# Patient Record
Sex: Male | Born: 1940 | Race: White | Hispanic: No
Health system: Southern US, Community
[De-identification: ages and names within clinical notes are randomized; demographics above are authoritative.]

## PROBLEM LIST (undated history)

## (undated) DIAGNOSIS — F0390 Unspecified dementia without behavioral disturbance: Secondary | ICD-10-CM

## (undated) DIAGNOSIS — G20 Parkinson's disease: Secondary | ICD-10-CM

## (undated) DIAGNOSIS — H9190 Unspecified hearing loss, unspecified ear: Secondary | ICD-10-CM

## (undated) DIAGNOSIS — W19XXXA Unspecified fall, initial encounter: Secondary | ICD-10-CM

## (undated) DIAGNOSIS — R296 Repeated falls: Secondary | ICD-10-CM

## (undated) DIAGNOSIS — G20A1 Parkinson's disease without dyskinesia, without mention of fluctuations: Secondary | ICD-10-CM

## (undated) HISTORY — PX: OTHER SURGICAL HISTORY: SHX169

## (undated) HISTORY — DX: Unspecified fall, initial encounter: W19.XXXA

## (undated) HISTORY — DX: Unspecified dementia, unspecified severity, without behavioral disturbance, psychotic disturbance, mood disturbance, and anxiety: F03.90

## (undated) HISTORY — DX: Repeated falls: R29.6

## (undated) HISTORY — DX: Parkinson's disease: G20

## (undated) HISTORY — DX: Unspecified hearing loss, unspecified ear: H91.90

## (undated) HISTORY — DX: Parkinson's disease without dyskinesia, without mention of fluctuations: G20.A1

---

## 2013-11-16 ENCOUNTER — Ambulatory Visit: Payer: Self-pay

## 2013-11-16 ENCOUNTER — Ambulatory Visit: Payer: PRIVATE HEALTH INSURANCE

## 2013-11-16 ENCOUNTER — Ambulatory Visit (INDEPENDENT_AMBULATORY_CARE_PROVIDER_SITE_OTHER): Payer: PRIVATE HEALTH INSURANCE | Admitting: Family Medicine

## 2013-11-16 DIAGNOSIS — R0789 Other chest pain: Secondary | ICD-10-CM

## 2013-11-16 DIAGNOSIS — M25531 Pain in right wrist: Secondary | ICD-10-CM

## 2013-11-16 DIAGNOSIS — R071 Chest pain on breathing: Secondary | ICD-10-CM

## 2013-11-16 DIAGNOSIS — S62109A Fracture of unspecified carpal bone, unspecified wrist, initial encounter for closed fracture: Secondary | ICD-10-CM | POA: Diagnosis not present

## 2013-11-16 DIAGNOSIS — M25539 Pain in unspecified wrist: Secondary | ICD-10-CM

## 2013-11-16 DIAGNOSIS — S62101A Fracture of unspecified carpal bone, right wrist, initial encounter for closed fracture: Secondary | ICD-10-CM

## 2013-11-16 DIAGNOSIS — IMO0002 Reserved for concepts with insufficient information to code with codable children: Secondary | ICD-10-CM

## 2013-11-16 DIAGNOSIS — T148XXA Other injury of unspecified body region, initial encounter: Secondary | ICD-10-CM

## 2013-11-16 MED ORDER — SILVER SULFADIAZINE 1 % EX CREA: 1.0000 "application " | TOPICAL_CREAM | Freq: Every day | CUTANEOUS | Status: DC

## 2013-11-16 MED ORDER — HYDROCODONE-ACETAMINOPHEN 5-325 MG PO TABS: 1.0000 | ORAL_TABLET | Freq: Four times a day (QID) | ORAL | Status: DC | PRN

## 2013-11-16 NOTE — Progress Notes (Signed)
Subjective:    Patient ID: Kyle Mcguire, male    DOB: 03-Jun-1941, 72 y.o.   MRN: [id number]  This chart was scribed for Elvina Sidle, MD by Blanchard Kelch, ED Scribe. The patient was seen in room 9. Patient's care was started at 4:34 PM.   HPI  Kyle Mcguire is a 72 y.o. male who presents to office due to a fall that occurred four and a half hours ago. He was about to walk a dog and lost his balance on some steps and landed on a cement slab. He denies hitting his head or losing consciousness. He has abrasions on the dorsum of his right pinky, ring and middle finger, right knee and a long diagonal abrasion on his chest that goes from his right pectoral down to his upper abdomen.   He came to the office because he started to feel short of breath after the fall. Immediately after the fall he felt as though he couldn't breath, but that subsided quickly. He then felt better and went home to his condo to lie down, but upon getting up he felt his chest tighten. The pain from his chest laceration is exacerbated by deep breaths, making it more difficult to breathe. He also has pain and mild swelling to his right wrist. He is right handed.   He states that is tetanus vaccination is up to date.  He and his wife are from [place] and come down to [place] in the winter to escape the cold weather and snow. Their son lives in [place]. This is his first week down in [place] this season. He enjoys playing golf down here.  He worked in trucking.  No past medical history on file. No past surgical history on file. No family history on file. No current outpatient prescriptions on file prior to visit.   No current facility-administered medications on file prior to visit.   Allergies not on file   Review of Systems  Constitutional: Negative for fever.  HENT: Negative for drooling.   Eyes: Negative for discharge.  Respiratory: Negative for cough.   Cardiovascular: Negative for  leg swelling.  Gastrointestinal: Negative for vomiting.  Endocrine: Negative for polyuria.  Genitourinary: Negative for hematuria.  Musculoskeletal: Positive for arthralgias and joint swelling. Negative for gait problem.  Skin: Positive for wound. Negative for rash.  Allergic/Immunologic: Negative for immunocompromised state.  Neurological: Negative for speech difficulty.  Hematological: Negative for adenopathy.  Psychiatric/Behavioral: Negative for confusion.       Objective:   Physical Exam  Nursing note and vitals reviewed.  CONSTITUTIONAL: Well developed/well nourished HEAD: Normocephalic/atraumatic EYES: EOMI/PERRL ENMT: Mucous membranes moist NECK: supple no meningeal signs SPINE:entire spine nontender CV: S1/S2 noted, no murmurs/rubs/gallops noted LUNGS: Lungs are clear to auscultation bilaterally, no apparent distress ABDOMEN: soft, nontender, no rebound or guarding GU:no cva tenderness NEURO: Pt is awake/alert, moves all extremitiesx4 EXTREMITIES: pulses normal, full ROM, tenderness and mild edema to right wrist SKIN: warm, color normal, abrasions to right pinky, ring, middle finger, PIP dorsal, chest has long diagonal 3.4 cm wide abrasion from right pectoral to left upper abdomen. PSYCH: no abnormalities of mood noted  [facility] reading (PRIMARY) by Dr. Milus Glazier: Right wrist x-ray: distal radial fracture through joint line, small effusion, no bony spurring. Chest x-ray: no infiltrates, no fractures, no pneumothorax     Assessment & Plan:   I personally performed the services described in this documentation, which was scribed in my presence. The recorded  information has been reviewed and is accurate. Chest wall pain - Plan: DG Chest 2 View, HYDROcodone-acetaminophen (NORCO) 5-325 MG per tablet  Right wrist pain - Plan: DG Wrist Complete Right, HYDROcodone-acetaminophen (NORCO) 5-325 MG per tablet  Wrist fracture, right, closed, initial encounter - Plan:  HYDROcodone-acetaminophen (NORCO) 5-325 MG per tablet, Ambulatory referral to Orthopedic Surgery  Abrasion - Plan: silver sulfADIAZINE (SILVADENE) 1 % cream  Signed, Elvina Sidle, MD

## 2013-11-18 ENCOUNTER — Telehealth: Payer: Self-pay

## 2013-11-18 NOTE — Telephone Encounter (Addendum)
Pt's wife states that the hydrocodone is no longer working for him, he would like to know what else he can take with that. He would also like to know if he can take ibuprophen or tylenol for in addition to the hydrocodone.  Pharm[facility]: [facility]  Best# [phone]

## 2013-11-22 NOTE — Telephone Encounter (Signed)
Left a message for patient to return call.

## 2013-11-28 NOTE — Telephone Encounter (Signed)
He can try ibuprofen with this. Called to advise, left detailed message.

## 2013-11-28 NOTE — Telephone Encounter (Signed)
LMOM for patient to return call.

## 2014-12-19 ENCOUNTER — Ambulatory Visit
Admission: RE | Admit: 2014-12-19 | Discharge: 2014-12-19 | Disposition: A | Discharge status: Home / Self Care | Payer: PPO | Source: Ambulatory Visit | Attending: Family Medicine | Admitting: Family Medicine

## 2014-12-19 ENCOUNTER — Other Ambulatory Visit: Payer: Self-pay | Admitting: Family Medicine

## 2014-12-19 DIAGNOSIS — N5089 Other specified disorders of the male genital organs: Secondary | ICD-10-CM

## 2014-12-27 ENCOUNTER — Encounter: Payer: Self-pay | Admitting: Neurology

## 2014-12-27 ENCOUNTER — Ambulatory Visit (INDEPENDENT_AMBULATORY_CARE_PROVIDER_SITE_OTHER): Payer: PPO | Admitting: Neurology

## 2014-12-27 VITALS — BP 120/80 | HR 80 | Ht 67.0 in | Wt 198.6 lb

## 2014-12-27 DIAGNOSIS — G20 Parkinson's disease: Secondary | ICD-10-CM | POA: Diagnosis not present

## 2014-12-27 MED ORDER — RASAGILINE MESYLATE 0.5 MG PO TABS: 0.5000 mg | ORAL_TABLET | Freq: Every day | ORAL | Status: DC

## 2014-12-27 NOTE — Patient Instructions (Addendum)
Overall you are doing fairly well but I do want to suggest a few things today:   Remember to drink plenty of fluid, eat healthy meals and do not skip any meals. Try to eat protein with a every meal and eat a healthy snack such as fruit or nuts in between meals. Try to keep a regular sleep-wake schedule and try to exercise daily, particularly in the form of walking, 20-30 minutes a day, if you can.   As far as your medications are concerned, I would like to suggest: Azilect 0.5mg  daily. Please avoid the following medications while using Azilect: dextromethorphan (can be found in cough medicines), Luvox (fluvoxamine), Prozac (fluoxetine), Flexeril (cyclobenzaprine), Darvon (propoxyphene), St. John's Wort, Tramadol, Methadone, and Demerol (meperidine).  If you need to take ciprofloxacin for an infection, cut down the Azilect to  tab prior to starting the cipro  As far as diagnostic testing: MRI of the brain  I would like to see you back in 3-4, sooner if we need to. Please call us with any interim questions, concerns, problems, updates or refill requests.   Please also call us for any test results so we can go over those with you on the phone.  My clinical assistant and will answer any of your questions and relay your messages to me and also relay most of my messages to you.   Our phone number is [phone]. We also have an after hours call service for urgent matters and there is a physician on-call for urgent questions. For any emergencies you know to call 911 or go to the nearest emergency room

## 2014-12-27 NOTE — Progress Notes (Signed)
[facility]    Provider:  Dr Kyle Mcguire Referring Provider: Vernie Shanks, MD Primary Care Physician:  No PCP Per Patient  CC:  Tremors  HPI:  Kyle Mcguire is a 74 y.o. male with no significant PMHx who is here as a consult from Dr. Jacelyn Mcguire for Tremors. Tremors started 8-9 months ago. Started in the right hand. He noticed it when he tried to write, penmanship was smaller and shaky. Staying about the same. Left arm is fine. Walking fine. Sense of smell ok. Has constipation. He just moved here from [place]. Lives in a town house. Plays golf, lives independently. Can't read his writing anymore. No stiffness. Father had a tremor. Balance is getting poor, if he bends down he may fall over. Vision is changing, "hazy" vision when driving at night. Worked in trucking for 33 years. He can't pick up as much as he used to. He is very forgetful. He will forget recent conversations, his wife will tell him something and a few hours later he may forget. Dad with Parkinson's Disease. Tremor is mostly at rest or when writing. Was walking the dog and the dog went quickly and he couldn't stop himself from falling down, and broke his wrist. Sometimes when he speeds up he can't stop, has to "stutter step" to turn. No neck pain. He takes daily aspirin, no history of stroke or TIA. Denies DM or heart disease. He takes no medication. He does not drink alcohol and never smoked.   Reviewed notes, labs and imaging from outside physicians, which showed: Was sent from [facility] for evaluation of tremor. CMP wnl, LDL 98,   Review of Systems: Patient complains of symptoms per HPI as well as the following symptoms: Tremor.snoring.  Denies SOB, CP. Pertinent negatives per HPI. All others negative.   History   Social History  . Marital Status: Married    Spouse Name: Kyle Mcguire     Number of Children: 2  . Years of Education: 12   Occupational History  . Retired     Social History Main Topics  . Smoking status:  Never Smoker   . Smokeless tobacco: Never Used  . Alcohol Use: No  . Drug Use: No  . Sexual Activity: Not on file   Other Topics Concern  . Not on file   Social History Narrative   Patient lives at home with wife Kyle Mcguire   Patient has 2 children    Patient has a high school education    Patient is right handed    Patient is retired        Family History  Problem Relation Age of Onset  . Parkinsonism Father     Past Medical History  Diagnosis Date  . Hearing loss     Past Surgical History  Procedure Laterality Date  . None      Current Outpatient Prescriptions  Medication Sig Dispense Refill  . aspirin 81 MG chewable tablet Chew 81 mg by mouth daily.    . rasagiline (AZILECT) 0.5 MG TABS tablet Take 1 tablet (0.5 mg total) by mouth daily. 30 tablet 0  . rasagiline (AZILECT) 0.5 MG TABS tablet Take 1 tablet (0.5 mg total) by mouth daily. 30 tablet 6   No current facility-administered medications for this visit.    Allergies as of 12/27/2014  . (No Known Allergies)    Vitals: BP 120/80 mmHg  Pulse 80  Ht 5\' 7"  (1.702 m)  Wt 198 lb 9.6 oz (90.084 kg)  BMI 31.10 kg/m2 Last Weight:  Wt Readings from Last 1 Encounters:  12/27/14 198 lb 9.6 oz (90.084 kg)   Last Height:   Ht Readings from Last 1 Encounters:  12/27/14 5\' 7"  (1.702 m)   Physical exam: Exam: Gen: NAD, conversant, well groomed                     CV: RRR, no MRG. No Carotid Bruits. No peripheral edema, warm, nontender Eyes: Conjunctivae clear without exudates or hemorrhage  Neuro: Detailed Neurologic Exam  Speech:    Speech is normal; fluent and spontaneous with normal comprehension.  Cognition:    The patient is oriented to person, place, and time;     recent and remote memory intact;     language fluent;     normal attention, concentration,     fund of knowledge Cranial Nerves:     The pupils are equal, round, and reactive to light. The fundi are normal and spontaneous venous  pulsations are present. Visual fields are full to finger confrontation. Extraocular movements are intact. Trigeminal sensation is intact and the muscles of mastication are normal. The face is symmetric. The palate elevates in the midline. Reduced hearing bilat. Voice is normal. Shoulder shrug is normal. The tongue has normal motion without fasciculations.   Coordination:    Normal rapid alternating movements. 2-3 steps back with push test  Gait:    Reduced arm swing. Mildly shuffling.    Motor Observation:    Mild intention tremor Tone:   cogwheeling uppers with facilitation   Posture:    Mildly stooped    Strength:    Strength is V/V in the upper and lower limbs.      Sensation: intact to LT     Reflex Exam:  DTR's:    Deep tendon reflexes in the upper and lower extremities are Brisk throughout esp patellars.  Toes:    The toes are downgoing bilaterally.   Clonus:    Clonus is absent.  Assessment/Plan:  74 year old male here for evaluation of tremor, worsening penmanship, balance problems, falls. Exam shows parkinsonian symptoms, stooped with mild shuffling and reduced arm swing, cogwheeling with facilitation. Will order MRi of the brain.   Will start Azilect and follow up in 4-6 weeks at which time can increase the Azilect. Discussed the following: please avoid the following medications while using Azilect: dextromethorphan (can be found in cough medicines), Luvox (fluvoxamine), Prozac (fluoxetine), Flexeril (cyclobenzaprine), Darvon (propoxyphene), St. John's Wort, Tramadol, Methadone, and Demerol (meperidine).  If you need to take ciprofloxacin for an infection, cut down the Azilect to  tab prior to starting the cipro  Continue daily asa 81mg  for stroke prevention.   Kyle Ill, MD  [facility] [address] [place], [place] [address]  Phone [phone] Fax [phone]  A total of 30 minutes was spent in with this patient. Over  half this time was spent on counseling patient on the diagnosis of Parkinsonism and different diagnostic and  therapeutic options available.

## 2014-12-29 MED ORDER — RASAGILINE MESYLATE 0.5 MG PO TABS: 0.5000 mg | ORAL_TABLET | Freq: Every day | ORAL | Status: DC

## 2014-12-31 ENCOUNTER — Encounter: Payer: Self-pay | Admitting: Neurology

## 2014-12-31 DIAGNOSIS — G20 Parkinson's disease: Secondary | ICD-10-CM | POA: Insufficient documentation

## 2015-01-04 ENCOUNTER — Ambulatory Visit
Admission: RE | Admit: 2015-01-04 | Discharge: 2015-01-04 | Disposition: A | Discharge status: Home / Self Care | Payer: PPO | Source: Ambulatory Visit | Attending: Neurology | Admitting: Neurology

## 2015-01-04 DIAGNOSIS — G20 Parkinson's disease: Secondary | ICD-10-CM | POA: Diagnosis not present

## 2015-01-08 ENCOUNTER — Telehealth: Payer: Self-pay | Admitting: Neurology

## 2015-01-08 NOTE — Telephone Encounter (Signed)
I called the patient to provide number for patient assistance of [phone].  He says he will contact them to see if he qualifies for assistance and will call us back if needed.

## 2015-01-08 NOTE — Telephone Encounter (Signed)
Spoke to patient and let him know the MRi of his brain was unremarkable, non-specific white matter changes and some mild age-appropriate atrophy. The Azilect is really helping however insurance will not cover it.  Jessica - Anything we can do to help get this patient Azilect? Please let me know, thank you.

## 2015-02-15 ENCOUNTER — Ambulatory Visit (INDEPENDENT_AMBULATORY_CARE_PROVIDER_SITE_OTHER): Payer: PPO | Admitting: Neurology

## 2015-02-15 ENCOUNTER — Encounter: Payer: Self-pay | Admitting: Neurology

## 2015-02-15 VITALS — BP 126/74 | HR 67 | Ht 67.0 in | Wt 199.0 lb

## 2015-02-15 DIAGNOSIS — G20 Parkinson's disease: Secondary | ICD-10-CM | POA: Diagnosis not present

## 2015-02-15 DIAGNOSIS — G20A1 Parkinson's disease without dyskinesia, without mention of fluctuations: Secondary | ICD-10-CM

## 2015-02-15 MED ORDER — CARBIDOPA-LEVODOPA 25-100 MG PO TABS: 1.0000 | ORAL_TABLET | Freq: Three times a day (TID) | ORAL | Status: DC

## 2015-02-15 NOTE — Patient Instructions (Addendum)
Overall you are doing fairly well but I do want to suggest a few things today:   Remember to drink plenty of fluid, eat healthy meals and do not skip any meals. Try to eat protein with a every meal and eat a healthy snack such as fruit or nuts in between meals. Try to keep a regular sleep-wake schedule and try to exercise daily, particularly in the form of walking, 20-30 minutes a day, if you can.   As far as your medications are concerned, I would like to suggest: Start with Sinemet (carbidopa/levodopa) 1/2 tablet three times a day. After 5-7 days can increase to 1 tablet 3 times daily. ry to separate Sinemet from food (especially protein-rich foods like meat, dairy, eggs) by about 30-60 mins - this will help the absorption of the medication. If you have some nausea with the medication, you can take it with some light food like crackers or ginger ale. Take it 4 hours apart for example 8am, noon and 4pm.   I would like to see you back in 4 - 6 weeks or call, sooner if we need to. Please call us with any interim questions, concerns, problems, updates or refill requests.   Please also call us for any test results so we can go over those with you on the phone.  My clinical assistant and will answer any of your questions and relay your messages to me and also relay most of my messages to you.   Our phone number is [phone]. We also have an after hours call service for urgent matters and there is a physician on-call for urgent questions. For any emergencies you know to call 911 or go to the nearest emergency room  There is an excellent Parkinson's support group in [place]. It is called "[employer]" and meets once a month. Please call Amy Marriot or Vianne Bulls at [phone] for more information. There is no charge/fee for joining the group.

## 2015-02-15 NOTE — Progress Notes (Signed)
[facility]    Provider:  Dr Jaynee Eagles Referring Provider: No ref. provider found Primary Care Physician:  No PCP Per Patient  CC:  PD  HPI:  Kyle Mcguire is a 74 y.o. male here as a follow up.   Azilect not helping with the tremor and making him dizzy. Otherwise symptoms are the same.   Reviewed notes, labs and imaging from outside physicians, which showed:  MR Brsain 12/2014: This is a borderline normal MRI of the brain without contrast showing a mild extent of white matter foci that are likely due to age-related microvascular change. There is associated minimal atrophy. There are no acute findings.  Initial visit 12/27/2014: Kyle Mcguire is a 74 y.o. male with no significant PMHx who is here as a consult from Dr. Jacelyn Grip for Tremors. Tremors started 8-9 months ago. Started in the right hand. He noticed it when he tried to write, penmanship was smaller and shaky. Staying about the same. Left arm is fine. Walking fine. Sense of smell ok. Has constipation. He just moved here from [place]. Lives in a town house. Plays golf, lives independently. Can't read his writing anymore. No stiffness. Father had a tremor. Balance is getting poor, if he bends down he may fall over. Vision is changing, "hazy" vision when driving at night. Worked in trucking for 33 years. He can't pick up as much as he used to. He is very forgetful. He will forget recent conversations, his wife will tell him something and a few hours later he may forget. Dad with Parkinson's Disease. Tremor is mostly at rest or when writing. Was walking the dog and the dog went quickly and he couldn't stop himself from falling down, and broke his wrist. Sometimes when he speeds up he can't stop, has to "stutter step" to turn. No neck pain. He takes daily aspirin, no history of stroke or TIA. Denies DM or heart disease. He takes no medication. He does not drink alcohol and never smoked.   Reviewed notes, labs and imaging from  outside physicians, which showed: Was sent from [facility] for evaluation of tremor. CMP wnl, LDL 98,   MRI of the brain: This is a borderline normal MRI of the brain without contrast showing a mild extent of white matter foci that are likely due to age-related microvascular change. There is associated minimal atrophy. There are no acute findings.   Review of Systems: Patient complains of symptoms per HPI as well as the following symptoms:blurred vision. Pertinent negatives per HPI. All others negative.   History   Social History  . Marital Status: Married    Spouse Name: [name]   . Number of Children: 2  . Years of Education: 12   Occupational History  . Retired     Social History Main Topics  . Smoking status: Never Smoker   . Smokeless tobacco: Never Used  . Alcohol Use: No  . Drug Use: No  . Sexual Activity: Not on file   Other Topics Concern  . Not on file   Social History Narrative   Patient lives at home with wife [name]   Patient has 2 children    Patient has a high school education    Patient is right handed    Patient is retired    Caffeine use: 1 cup coffee.day     Family History  Problem Relation Age of Onset  . Parkinsonism Father     Past Medical History  Diagnosis Date  .  Hearing loss     Past Surgical History  Procedure Laterality Date  . None      Current Outpatient Prescriptions  Medication Sig Dispense Refill  . aspirin 81 MG chewable tablet Chew 81 mg by mouth daily.    . rasagiline (AZILECT) 0.5 MG TABS tablet Take 1 tablet (0.5 mg total) by mouth daily. 30 tablet 0  . rasagiline (AZILECT) 0.5 MG TABS tablet Take 1 tablet (0.5 mg total) by mouth daily. 30 tablet 6   No current facility-administered medications for this visit.    Allergies as of 02/15/2015  . (No Known Allergies)    Vitals: BP 126/74 mmHg  Pulse 67  Ht 5\' 7"  (1.702 m)  Wt 199 lb (90.266 kg)  BMI 31.16 kg/m2 Last Weight:  Wt Readings from Last 1 Encounters:    02/15/15 199 lb (90.266 kg)   Last Height:   Ht Readings from Last 1 Encounters:  02/15/15 5\' 7"  (1.702 m)   Cranial Nerves: decreased blink  The pupils are equal, round, and reactive to light. The fundi are normal and spontaneous venous pulsations are present. Visual fields are full to finger confrontation. Extraocular movements are intact. Trigeminal sensation is intact and the muscles of mastication are normal. The face is symmetric. The palate elevates in the midline. Reduced hearing bilat. Voice is normal. Shoulder shrug is normal. The tongue has normal motion without fasciculations.   Coordination:  Normal rapid alternating movements. 2-3 steps back with push test  Gait:  Reduced arm swing. Mildly shuffling.   Motor Observation:  Mild resting tremor with  Intention and postural components Tone:  cogwheeling uppers with facilitation   Posture:  Mildly stooped   Strength:  Strength is V/V in the upper and lower limbs.    Sensation: intact to LT   Reflex Exam:  DTR's:  Deep tendon reflexes in the upper and lower extremities are Brisk throughout esp patellars.  Toes:  The toes are downgoing bilaterally.  Clonus:  Clonus is absent.      Assessment/Plan:  74 year old male here for evaluation of tremor, worsening penmanship, balance problems, falls. Exam shows parkinsonian symptoms, stooped with mild shuffling and reduced arm swing, cogwheeling with facilitation. MRI of the brain unremarkable.   Did not tolerate Azilect, will switch to Sinemet. Discussed side effects and when to stop.  [employer] [facility] PD group   Sarina Ill, MD  [facility] [address] [place], [place] [address]  Phone [phone] Fax [phone]  A total of 25 minutes was spent face-to-face with this patient. Over half this time was spent on counseling patient on the PD diagnosis and different diagnostic and therapeutic options  available.

## 2015-04-16 ENCOUNTER — Telehealth: Payer: Self-pay | Admitting: *Deleted

## 2015-04-16 NOTE — Telephone Encounter (Signed)
Trying to reschedule pt appointment on 03-28-15 at 10:30 am to a different day for follow-up 68min appt since Dr. Jaynee Eagles is no longer here on Fridays. Gave [facility] phone number and office hours. Please reschedule if pt calls back. Thank you!

## 2015-04-19 NOTE — Telephone Encounter (Signed)
Spoke with patient and rescheduled him from 04/27/15 to 04/26/15 due to Dr. Jaynee Eagles no longer here on Friday's. Pt aware and verbalized understanding.

## 2015-04-26 ENCOUNTER — Encounter: Payer: Self-pay | Admitting: Neurology

## 2015-04-26 ENCOUNTER — Ambulatory Visit (INDEPENDENT_AMBULATORY_CARE_PROVIDER_SITE_OTHER): Payer: PPO | Admitting: Neurology

## 2015-04-26 VITALS — BP 122/68 | HR 64 | Temp 97.9°F | Wt 195.6 lb

## 2015-04-26 DIAGNOSIS — G20 Parkinson's disease: Secondary | ICD-10-CM | POA: Diagnosis not present

## 2015-04-26 MED ORDER — CARBIDOPA-LEVODOPA 25-100 MG PO TABS: 2.0000 | ORAL_TABLET | Freq: Three times a day (TID) | ORAL | Status: DC

## 2015-04-26 NOTE — Progress Notes (Signed)
[facility]    Provider:  Dr Jaynee Eagles Referring Provider: No ref. provider found Primary Care Physician:  Anthoney Harada, MD  CC: Parkinson's Disease (PD)  Interval update 04/26/2015: He stopped the Sinemet 2 weeks ago. Says it didn't help. Explained that we may need to increase it. Discussed in depth the plan to slowly increase the Sinemet, that this is medication for PD but we need to increase slowly. If it didn't help, dose was too low and we had plans to increase.  No side effects.    Visit 3/3: JOSELITO FIELDHOUSE is a 74 y.o. male here as a follow up.   Azilect not helping with the tremor and making him dizzy. Otherwise symptoms are the same.   Reviewed notes, labs and imaging from outside physicians, which showed:  MR Brsain 12/2014: This is a borderline normal MRI of the brain without contrast showing a mild extent of white matter foci that are likely due to age-related microvascular change. There is associated minimal atrophy. There are no acute findings.  Initial visit 12/27/2014: JUVON TEATER is a 74 y.o. male with no significant PMHx who is here as a consult from Dr. Jacelyn Grip for Tremors. Tremors started 8-9 months ago. Started in the right hand. He noticed it when he tried to write, penmanship was smaller and shaky. Staying about the same. Left arm is fine. Walking fine. Sense of smell ok. Has constipation. He just moved here from [place]. Lives in a town house. Plays golf, lives independently. Can't read his writing anymore. No stiffness. Father had a tremor. Balance is getting poor, if he bends down he may fall over. Vision is changing, "hazy" vision when driving at night. Worked in trucking for 33 years. He can't pick up as much as he used to. He is very forgetful. He will forget recent conversations, his wife will tell him something and a few hours later he may forget. Dad with Parkinson's Disease. Tremor is mostly at rest or when writing. Was walking the dog and  the dog went quickly and he couldn't stop himself from falling down, and broke his wrist. Sometimes when he speeds up he can't stop, has to "stutter step" to turn. No neck pain. He takes daily aspirin, no history of stroke or TIA. Denies DM or heart disease. He takes no medication. He does not drink alcohol and never smoked.   Reviewed notes, labs and imaging from outside physicians, which showed: Was sent from [facility] for evaluation of tremor. CMP wnl, LDL 98,   MRI of the brain: This is a borderline normal MRI of the brain without contrast showing a mild extent of white matter foci that are likely due to age-related microvascular change. There is associated minimal atrophy. There are no acute findings.  Review of Systems: Patient complains of symptoms per HPI as well as the following symptoms: Tremor, no chest pain, no dizziness, no shortness of breath. Pertinent negatives per HPI. All others negative.   History   Social History  . Marital Status: Married    Spouse Name: [name]   . Number of Children: 2  . Years of Education: 12   Occupational History  . Retired     Social History Main Topics  . Smoking status: Never Smoker   . Smokeless tobacco: Never Used  . Alcohol Use: No  . Drug Use: No  . Sexual Activity: Not on file   Other Topics Concern  . Not on file   Social History Narrative  Patient lives at home with wife [name]   Patient has 2 children    Patient has a high school education    Patient is right handed    Patient is retired    Caffeine use: 1 cup coffee.day     Family History  Problem Relation Age of Onset  . Parkinsonism Father     Past Medical History  Diagnosis Date  . Hearing loss     Past Surgical History  Procedure Laterality Date  . None      Current Outpatient Prescriptions  Medication Sig Dispense Refill  . aspirin 81 MG chewable tablet Chew 81 mg by mouth daily.    . carbidopa-levodopa (SINEMET) 25-100 MG per tablet Take 2 tablets  by mouth 3 (three) times daily. 180 tablet 6   No current facility-administered medications for this visit.    Allergies as of 04/26/2015  . (No Known Allergies)    Vitals: BP 122/68 mmHg  Pulse 64  Temp(Src) 97.9 F (36.6 C)  Wt 195 lb 9.6 oz (88.724 kg) Last Weight:  Wt Readings from Last 1 Encounters:  04/26/15 195 lb 9.6 oz (88.724 kg)   Last Height:   Ht Readings from Last 1 Encounters:  02/15/15 5\' 7"  (1.702 m)   Cranial Nerves: decreased blink  The pupils are equal, round, and reactive to light. The fundi are normal and spontaneous venous pulsations are present. Visual fields are full to finger confrontation. Extraocular movements are intact. Trigeminal sensation is intact and the muscles of mastication are normal. The face is symmetric. The palate elevates in the midline. Reduced hearing bilat. Voice is normal. Shoulder shrug is normal. The tongue has normal motion without fasciculations.   Coordination:  Normal rapid alternating movements. 2-3 steps back with push test  Gait:  Reduced arm swing. Mildly shuffling.   Motor Observation:  Mild resting tremor with Intention and postural components Tone:  cogwheeling uppers with facilitation   Posture:  Mildly stooped   Strength:  Strength is V/V in the upper and lower limbs.         Assessment/Plan: 74 year old male here for evaluation of tremor, worsening penmanship, balance problems, falls. Exam shows parkinsonian symptoms, stooped with mild shuffling and reduced arm swing, cogwheeling with facilitation. MRI of the brain unremarkable.   Did not tolerate Azilect, will continue with Sinemet. Discussed side effects and when to stop. As far as your medications are concerned, I would like to suggest:  Sinemet: for one week take 1.5 pills three times a day then can increase to 2 pills three times a day. Space 4 hours apart. Try to separate Sinemet from food (especially protein-rich foods  like meat, dairy, eggs) by about 30-60 mins - this will help the absorption of the medication. If you have some nausea with the medication, you can take it with some light food like crackers or ginger ale  [facility] over PD group  Sarina Ill, MD  [facility] [address] [place], [place] [address]  Phone [phone] Fax [phone]  A total of 15 minutes was spent face-to-face with this patient. Over half this time was spent on counseling patient on the PD diagnosis and different diagnostic and therapeutic options available.

## 2015-04-26 NOTE — Patient Instructions (Addendum)
Remember to drink plenty of fluid, eat healthy meals and do not skip any meals. Try to eat protein with a every meal and eat a healthy snack such as fruit or nuts in between meals. Try to keep a regular sleep-wake schedule and try to exercise daily, particularly in the form of walking, 20-30 minutes a day, if you can.   As far as your medications are concerned, I would like to suggest:  Sinemet: for one week take 1.5 pills three times a day then can increase to 2 pills three times a day. Space 4 hours apart.  Try to separate Sinemet from food (especially protein-rich foods like meat, dairy, eggs) by about 30-60 mins - this will help the absorption of the medication. If you have some nausea with the medication, you can take it with some light food like crackers or ginger ale   As far as diagnostic testing:   I would like to see you back in 3 months, sooner if we need to. Please call us with any interim questions, concerns, problems, updates or refill requests.   Please call us if the medication is not helping or you have side effects.   My clinical assistant and will answer any of your questions and relay your messages to me and also relay most of my messages to you.   Our phone number is [phone]. We also have an after hours call service for urgent matters and there is a physician on-call for urgent questions. For any emergencies you know to call 911 or go to the nearest emergency room

## 2015-04-27 ENCOUNTER — Ambulatory Visit: Payer: PPO | Admitting: Neurology

## 2015-05-08 ENCOUNTER — Telehealth: Payer: Self-pay | Admitting: Neurology

## 2015-05-08 NOTE — Telephone Encounter (Signed)
Tol dhim to go back down to 1.5 pills three times a day if that dose was working and not causing him side effects. thanks

## 2015-05-08 NOTE — Telephone Encounter (Signed)
Talked with patient and he stated after he increased in Sinemet to 2 pills three times a day he started having bad episodes in the last three to four days where he was very shaky and ached all over. Pt stated he was also dizzy "but it said on medication bottle this may cause dizziness so I was not worried". Pt went to see his PCP, Dr. Jacelyn Grip around 12pm today and pt stated Dr. Jacelyn Grip advised him the dose might be too high and advised him to decrease to 1 pill three times a day and to contact Dr. Jaynee Eagles to check about dosing. I told him I would let Dr. Jaynee Eagles know and we will call him back. Pt verbalized understanding.

## 2015-05-08 NOTE — Telephone Encounter (Signed)
Pt called and requested to speak with someone regarding some questions he has about his medication Rx. carbidopa-levodopa (SINEMET) 25-100 MG per tablet. He does not think the medication is working correctly and states that he had an "episode" this morning and would like to speak with someone regarding this also. Please call and advise.

## 2015-07-31 ENCOUNTER — Ambulatory Visit: Payer: Self-pay | Admitting: Neurology

## 2015-08-13 ENCOUNTER — Ambulatory Visit (INDEPENDENT_AMBULATORY_CARE_PROVIDER_SITE_OTHER): Payer: PPO | Admitting: Neurology

## 2015-08-13 ENCOUNTER — Encounter: Payer: Self-pay | Admitting: Neurology

## 2015-08-13 ENCOUNTER — Telehealth: Payer: Self-pay | Admitting: Neurology

## 2015-08-13 VITALS — BP 108/69 | HR 63 | Ht 67.0 in | Wt 192.0 lb

## 2015-08-13 DIAGNOSIS — G20 Parkinson's disease: Secondary | ICD-10-CM | POA: Diagnosis not present

## 2015-08-13 MED ORDER — CARBIDOPA-LEVODOPA 25-100 MG PO TABS: 2.0000 | ORAL_TABLET | Freq: Four times a day (QID) | ORAL | Status: DC

## 2015-08-13 NOTE — Telephone Encounter (Signed)
Anik/[facility] called, has questions re: carbidopa-levodopa (SINEMET) 25-100 MG per tablet directions are confusing 2- tabs 4x per day or 2- tabs 6x per day? Please call to advise.

## 2015-08-13 NOTE — Patient Instructions (Addendum)
Overall you are doing fairly well but I do want to suggest a few things today:   Remember to drink plenty of fluid, eat healthy meals and do not skip any meals. Try to eat protein with a every meal and eat a healthy snack such as fruit or nuts in between meals. Try to keep a regular sleep-wake schedule and try to exercise daily, particularly in the form of walking, 20-30 minutes a day, if you can.   As far as your medications are concerned, I would like to suggest. Sinemet 1.5 tablets 4x a day every 4 hours. -Try to separate Sinemet from food (especially protein-rich foods like meat, dairy, eggs) by about 30-60 mins - this will help the absorption of the medication. If you have some nausea with the medication, you can take it with some light food like crackers or ginger ale  Example: 7:30am 11:30am 3:30pm 7:30pm  I would like to see you back in 6 months, sooner if we need to. Please call us with any interim questions, concerns, problems, updates or refill requests.   Our phone number is [phone]. We also have an after hours call service for urgent matters and there is a physician on-call for urgent questions. For any emergencies you know to call 911 or go to the nearest emergency room

## 2015-08-13 NOTE — Progress Notes (Signed)
[facility]    Provider:  Dr Jaynee Eagles Referring Provider: Vernie Shanks, MD Primary Care Physician:  Anthoney Harada, MD  CC:  PD  HPI:  Kyle Mcguire is a wonderful 74 y.o. male here as a follow up for PD  Interval history: He took the sinemet this morning at 7:30am. The tremor is better right now. The sinemet is helping it, it just wears off.  Not hard for him to take it 3x a day. Once in a while he forgets. He doesn't know how long the sinemet lasts. No dizziness when he stands up. Not lightheaded, no symptoms fron the sinemet. Right now at this moment he is happy with his tremor, last took the Sinemet a few hours ago. He gets up at 7:30 amd walks to sheets. He goes to bed between 10 and11pm. Feels like his voice is getting lower. No problems with sleeping or vivid dreams. No falls. Sense of smell and taste is ok. No dysphagia. No memory changes. No hallucinations. No falls. No drooling. Noticed slight tremor in the right hand by the end of the appointment.   Vould try taking the Sinemet every 4 hours. 8am, noon, 4pm and 8pm. In the morning his handwriting is worse but he takes the sinemet and that is fine, he doesn't need a long-acting at night. But right   Interval update 04/26/2015: He stopped the Sinemet 2 weeks ago. Says it didn't help. Explained that we may need to increase it. Discussed in depth the plan to slowly increase the Sinemet, that this is medication for PD but we need to increase slowly. If it didn't help, dose was too low and we had plans to increase. No side effects.    Visit 3/3: Kyle Mcguire is a 74 y.o. male here as a follow up.   Azilect not helping with the tremor and making him dizzy. Otherwise symptoms are the same.   Reviewed notes, labs and imaging from outside physicians, which showed:  MR Brsain 12/2014: This is a borderline normal MRI of the brain without contrast showing a mild extent of white matter foci that are likely due to  age-related microvascular change. There is associated minimal atrophy. There are no acute findings.  Initial visit 12/27/2014: Kyle Mcguire is a 74 y.o. male with no significant PMHx who is here as a consult from Dr. Jacelyn Grip for Tremors. Tremors started 8-9 months ago. Started in the right hand. He noticed it when he tried to write, penmanship was smaller and shaky. Staying about the same. Left arm is fine. Walking fine. Sense of smell ok. Has constipation. He just moved here from [place]. Lives in a town house. Plays golf, lives independently. Can't read his writing anymore. No stiffness. Father had a tremor. Balance is getting poor, if he bends down he may fall over. Vision is changing, "hazy" vision when driving at night. Worked in trucking for 33 years. He can't pick up as much as he used to. He is very forgetful. He will forget recent conversations, his wife will tell him something and a few hours later he may forget. Dad with Parkinson's Disease. Tremor is mostly at rest or when writing. Was walking the dog and the dog went quickly and he couldn't stop himself from falling down, and broke his wrist. Sometimes when he speeds up he can't stop, has to "stutter step" to turn. No neck pain. He takes daily aspirin, no history of stroke or TIA. Denies DM or heart  disease. He takes no medication. He does not drink alcohol and never smoked.   Reviewed notes, labs and imaging from outside physicians, which showed: Was sent from [facility] for evaluation of tremor. CMP wnl, LDL 98,   MRI of the brain: This is a borderline normal MRI of the brain without contrast showing a mild extent of white matter foci that are likely due to age-related microvascular change. There is associated minimal atrophy. There are no acute findings.  Review of Systems: Patient complains of symptoms per HPI as well as the following symptoms: Tremor, no chest pain, no dizziness, no shortness of breath. Pertinent negatives per HPI. All others  negative.  Social History   Social History  . Marital Status: Married    Spouse Name: [name]   . Number of Children: 2  . Years of Education: 12   Occupational History  . Retired     Social History Main Topics  . Smoking status: Never Smoker   . Smokeless tobacco: Never Used  . Alcohol Use: No  . Drug Use: No  . Sexual Activity: Not on file   Other Topics Concern  . Not on file   Social History Narrative   Patient lives at home with wife [name]   Patient has 2 children    Patient has a high school education    Patient is right handed    Patient is retired    Caffeine use: 1 cup coffee.day     Family History  Problem Relation Age of Onset  . Parkinsonism Father     Past Medical History  Diagnosis Date  . Hearing loss     Past Surgical History  Procedure Laterality Date  . None    . No surgical history per patient      Current Outpatient Prescriptions  Medication Sig Dispense Refill  . aspirin 81 MG chewable tablet Chew 81 mg by mouth daily.    . carbidopa-levodopa (SINEMET) 25-100 MG per tablet Take 2 tablets by mouth 3 (three) times daily. 180 tablet 6   No current facility-administered medications for this visit.    Allergies as of 08/13/2015  . (No Known Allergies)    Vitals: BP 108/69 mmHg  Pulse 63  Wt 192 lb (87.091 kg) Last Weight:  Wt Readings from Last 1 Encounters:  08/13/15 192 lb (87.091 kg)   Last Height:   Ht Readings from Last 1 Encounters:  02/15/15 5\' 7"  (1.702 m)     Cranial Nerves: decreased blink  The pupils are equal, round, and reactive to light.  Visual fields are full to finger confrontation. Extraocular movements are intact. Trigeminal sensation is intact and the muscles of mastication are normal. The face is symmetric. The palate elevates in the midline. Reduced hearing bilat. Voice is normal. Shoulder shrug is normal. The tongue has normal motion without fasciculations.   Coordination:  Normal rapid  alternating movements. 2-3 steps back with push test  Gait:  Reduced arm swing. Minimally shuffling.   Motor Observation:  Mild resting tremor with Intention and postural components that is improved today since he is on the Sinemet Tone:  cogwheeling uppers with facilitation improved  Posture:  Mildly stooped   Strength:  Strength is V/V in the upper and lower limbs.     Assessment/Plan: 74 year old wonderful 74 year old male here for evaluation of tremor, worsening penmanship, balance problems, falls. Exam shows parkinsonian symptoms, stooped with mild shuffling and reduced arm swing, cogwheeling with facilitation. MRI of the brain  unremarkable.   Did not tolerate Azilect, will continue with  1.5 pills three four time a day (8am, noon, 4pm and 8pm for example). Space 4 hours apart. Try to separate Sinemet from food (especially protein-rich foods like meat, dairy, eggs) by about 30-60 mins - this will help the absorption of the medication. If you have some nausea with the medication, you can take it with some light food like crackers or ginger ale  Power over PD group, information given    Sarina Ill, MD  [facility] [address] [place], [place] [address]  Phone [phone] Fax [phone]  A total of 30 minutes was spent face-to-face with this patient. Over half this time was spent on counseling patient on the Parkinson's Disease diagnosis and different diagnostic and therapeutic options available.

## 2015-08-14 NOTE — Telephone Encounter (Signed)
Janett Billow - would you mind taking care of calling the pharmacy and changing this prescription for me please?[name] He is taking 1.5 tabs 4x a day ( at 7:30am, 11:30am, 3:30pm, 7:30pm. ) I called him to verify. The prescription can just say 1.5 Sinemet tabs 4x a day and that will be fine. Dispense 180 tabs will 11 refills. Patient understands he needs to take it every 4 hours so that doesn't need to be spelled out in the script. I spoke to patient to verify that he is taking 1.5 tabs (not 2 tabs). He feels it doing very well. Thanks!

## 2015-08-15 MED ORDER — CARBIDOPA-LEVODOPA 25-100 MG PO TABS: 1.5000 | ORAL_TABLET | Freq: Four times a day (QID) | ORAL | Status: DC

## 2015-08-15 NOTE — Telephone Encounter (Signed)
No problem!  Rx has been updated and sent per instruction.  Receipt confirmed by pharmacy.

## 2015-11-23 ENCOUNTER — Other Ambulatory Visit: Payer: Self-pay | Admitting: Neurology

## 2016-01-03 DIAGNOSIS — E669 Obesity, unspecified: Secondary | ICD-10-CM | POA: Diagnosis not present

## 2016-01-03 DIAGNOSIS — G20 Parkinson's disease: Secondary | ICD-10-CM | POA: Diagnosis not present

## 2016-01-03 DIAGNOSIS — Z13220 Encounter for screening for lipoid disorders: Secondary | ICD-10-CM | POA: Diagnosis not present

## 2016-01-03 DIAGNOSIS — Z136 Encounter for screening for cardiovascular disorders: Secondary | ICD-10-CM | POA: Diagnosis not present

## 2016-01-03 DIAGNOSIS — M62830 Muscle spasm of back: Secondary | ICD-10-CM | POA: Diagnosis not present

## 2016-01-03 DIAGNOSIS — Z0000 Encounter for general adult medical examination without abnormal findings: Secondary | ICD-10-CM | POA: Diagnosis not present

## 2016-01-03 DIAGNOSIS — L570 Actinic keratosis: Secondary | ICD-10-CM | POA: Diagnosis not present

## 2016-02-12 ENCOUNTER — Ambulatory Visit (INDEPENDENT_AMBULATORY_CARE_PROVIDER_SITE_OTHER): Payer: PPO | Admitting: Neurology

## 2016-02-12 ENCOUNTER — Encounter: Payer: Self-pay | Admitting: Neurology

## 2016-02-12 ENCOUNTER — Telehealth: Payer: Self-pay | Admitting: Neurology

## 2016-02-12 VITALS — BP 113/72 | HR 60 | Ht 67.0 in | Wt 195.6 lb

## 2016-02-12 DIAGNOSIS — R42 Dizziness and giddiness: Secondary | ICD-10-CM

## 2016-02-12 DIAGNOSIS — I951 Orthostatic hypotension: Secondary | ICD-10-CM | POA: Diagnosis not present

## 2016-02-12 MED ORDER — CARBIDOPA-LEVODOPA 25-100 MG PO TABS: 1.0000 | ORAL_TABLET | Freq: Four times a day (QID) | ORAL | Status: DC

## 2016-02-12 NOTE — Telephone Encounter (Signed)
Called and LVM for pt to schedule his 6 month f/u since pt didn't stop to checkout.

## 2016-02-12 NOTE — Patient Instructions (Signed)
Overall you are doing fairly well but I do want to suggest a few things today:   Remember to drink plenty of fluid, eat healthy meals and do not skip any meals. Try to eat protein with a every meal and eat a healthy snack such as fruit or nuts in between meals. Try to keep a regular sleep-wake schedule and try to exercise daily, particularly in the form of walking, 20-30 minutes a day, if you can.   As far as your medications are concerned, I would like to suggest: continue current medications  As far as diagnostic testing: Labs  I would like to see you back in 6 months, sooner if we need to. Please call us with any interim questions, concerns, problems, updates or refill requests.   Our phone number is [phone]. We also have an after hours call service for urgent matters and there is a physician on-call for urgent questions. For any emergencies you know to call 911 or go to the nearest emergency room

## 2016-02-12 NOTE — Progress Notes (Signed)
[id number]    Provider:  Dr Kyle Mcguire Referring Provider: Vernie Shanks, MD Primary Care Physician:  Kyle Harada, MD  CC: PD  HPI: Kyle Mcguire is a wonderful 75 y.o. male here as a follow up for PD  He is on 1 tab 4 times a day every 4 hours. His tremor is better. He takes the medication around 8am he feels the tremor about 10 - 10:30. Discussed that can take it every 3 hours instead. He feels the medication wearing off. However very mild tremor right now and he took his pill at 8am. He is having dizziness all the time. That started a month ago. There was no change in medication a month ago. Worse when moving. He is not drinking enough water. In the morning he has coffee. At lunch time he has a sandwhich or half a sandwich and lemonade. And a full glass of water with his supper. No room spinning. He is lightheaded, no CP, no SOB. No falls. No dysphagia. Not with sitting or laying. More upon standing and ambulating.   Interval history: He took the sinemet this morning at 7:30am. The tremor is better right now. The sinemet is helping it, it just wears off. Not hard for him to take it 3x a day. Once in a while he forgets. He doesn't know how long the sinemet lasts. No dizziness when he stands up. Not lightheaded, no symptoms fron the sinemet. Right now at this moment he is happy with his tremor, last took the Sinemet a few hours ago. He gets up at 7:30 amd walks to sheets. He goes to bed between 10 and11pm. Feels like his voice is getting lower. No problems with sleeping or vivid dreams. No falls. Sense of smell and taste is ok. No dysphagia. No memory changes. No hallucinations. No falls. No drooling. Noticed slight tremor in the right hand by the end of the appointment.   Vould try taking the Sinemet every 4 hours. 8am, noon, 4pm and 8pm. In the morning his handwriting is worse but he takes the sinemet and that is fine, he doesn't need a long-acting at night. But  right  Interval update 04/26/2015: He stopped the Sinemet 2 weeks ago. Says it didn't help. Explained that we may need to increase it. Discussed in depth the plan to slowly increase the Sinemet, that this is medication for PD but we need to increase slowly. If it didn't help, dose was too low and we had plans to increase. No side effects.    Visit 3/3: Kyle Mcguire is a 75 y.o. male here as a follow up.   Azilect not helping with the tremor and making him dizzy. Otherwise symptoms are the same.   Reviewed notes, labs and imaging from outside physicians, which showed:  MR Brsain 12/2014: This is a borderline normal MRI of the brain without contrast showing a mild extent of white matter foci that are likely due to age-related microvascular change. There is associated minimal atrophy. There are no acute findings.  Initial visit 12/27/2014: Kyle Mcguire is a 75 y.o. male with no significant PMHx who is here as a consult from Dr. Jacelyn Mcguire for Tremors. Tremors started 8-9 months ago. Started in the right hand. He noticed it when he tried to write, penmanship was smaller and shaky. Staying about the same. Left arm is fine. Walking fine. Sense of smell ok. Has constipation. He just moved here from [place]. Lives in a town house. Plays  golf, lives independently. Can't read his writing anymore. No stiffness. Father had a tremor. Balance is getting poor, if he bends down he may fall over. Vision is changing, "hazy" vision when driving at night. Worked in trucking for 33 years. He can't pick up as much as he used to. He is very forgetful. He will forget recent conversations, his wife will tell him something and a few hours later he may forget. Dad with Parkinson's Disease. Tremor is mostly at rest or when writing. Was walking the dog and the dog went quickly and he couldn't stop himself from falling down, and broke his wrist. Sometimes when he speeds up he can't stop, has to "stutter step" to turn. No neck pain.  He takes daily aspirin, no history of stroke or TIA. Denies DM or heart disease. He takes no medication. He does not drink alcohol and never smoked.   Reviewed notes, labs and imaging from outside physicians, which showed: Was sent from [facility] for evaluation of tremor. CMP wnl, LDL 98,   MRI of the brain: This is a borderline normal MRI of the brain without contrast showing a mild extent of white matter foci that are likely due to age-related microvascular change. There is associated minimal atrophy. There are no acute findings.  Review of Systems: Patient complains of symptoms per HPI as well as the following symptoms: Tremor, no chest pain, no dizziness, no shortness of breath. Pertinent negatives per HPI. All others negative.   Social History   Social History  . Marital Status: Married    Spouse Name: Kyle Mcguire   . Number of Children: 2  . Years of Education: 12   Occupational History  . Retired     Social History Main Topics  . Smoking status: Never Smoker   . Smokeless tobacco: Never Used  . Alcohol Use: No  . Drug Use: No  . Sexual Activity: Not on file   Other Topics Concern  . Not on file   Social History Narrative   Patient lives at home with wife Kyle Mcguire   Patient has 2 children    Patient has a high school education    Patient is right handed    Patient is retired    Caffeine use: 1 cup coffee.day     Family History  Problem Relation Age of Onset  . Parkinsonism Father     Past Medical History  Diagnosis Date  . Hearing loss     Past Surgical History  Procedure Laterality Date  . None    . No surgical history per patient      Current Outpatient Prescriptions  Medication Sig Dispense Refill  . aspirin 81 MG chewable tablet Chew 81 mg by mouth daily.    . carbidopa-levodopa (SINEMET) 25-100 MG per tablet Take 1.5 tablets by mouth 4 (four) times daily. (Patient taking differently: Take 1 tablet by mouth 4 (four) times daily. ) 180 tablet 11   No  current facility-administered medications for this visit.    Allergies as of 02/12/2016  . (No Known Allergies)    Vitals: BP 113/72 mmHg  Pulse 60  Ht 5\' 7"  (1.702 m)  Wt 195 lb 9.6 oz (88.724 kg)  BMI 30.63 kg/m2 Last Weight:  Wt Readings from Last 1 Encounters:  02/12/16 195 lb 9.6 oz (88.724 kg)   Last Height:   Ht Readings from Last 1 Encounters:  02/12/16 5\' 7"  (1.702 m)     Cranial Nerves: decreased blink  The pupils are  equal, round, and reactive to light. Visual fields are full to finger confrontation. Extraocular movements are intact. Trigeminal sensation is intact and the muscles of mastication are normal. The face is symmetric. The palate elevates in the midline. Reduced hearing bilat. Voice is normal. Shoulder shrug is normal. The tongue has normal motion without fasciculations.   Coordination:  Normal rapid alternating movements. 2-3 steps back with push test  Gait:  Reduced arm swing. Minimally shuffling.   Motor Observation:  Mild resting tremor with Intention and postural components that is improved today since he is on the Sinemet Tone:  cogwheeling uppers with facilitation improved  Posture:  Mildly stooped   Strength:  Strength is V/V in the upper and lower limbs.     Assessment/Plan: 74 year old wonderful male here for evaluation of tremor, worsening penmanship, balance problems, falls. Exam shows parkinsonian symptoms, stooped with mild shuffling and reduced arm swing, cogwheeling with facilitation. MRI of the brain unremarkable.   Continue Sinemet. 1 pills three four time a day (8am, 11am, 2pm and 5pm for example). Space 3-4 hours apart. Try to separate Sinemet from food (especially protein-rich foods like meat, dairy, eggs) by about 30-60 mins - this will help the absorption of the medication. If you have some nausea with the medication, you can take it with some light food like crackers or ginger ale  Power over PD  group, information given  -drink more water. Will check labs today - orthostatic vitals showed orthostatic vs. neurogenic hypotension.. Could be secondary to medication, dehydration and can also ne seen in PD. - Drink more water, hydrate well. If still dizzy may consider Midodrine or Florinef. Also Northera could be considered.  CC: Yaakov Guthrie, MD   Sarina Ill, MD  [facility] [address] [place], [place] [address]  Phone [phone] Fax [phone]  A total of 40 minutes was spent face-to-face with this patient. Over half this time was spent on counseling patient on the Parkinson's Disease diagnosis and different diagnostic and therapeutic options available.

## 2016-02-13 DIAGNOSIS — I951 Orthostatic hypotension: Secondary | ICD-10-CM | POA: Insufficient documentation

## 2016-02-18 ENCOUNTER — Other Ambulatory Visit (INDEPENDENT_AMBULATORY_CARE_PROVIDER_SITE_OTHER): Payer: Self-pay

## 2016-02-18 DIAGNOSIS — Z0289 Encounter for other administrative examinations: Secondary | ICD-10-CM

## 2016-02-18 DIAGNOSIS — R42 Dizziness and giddiness: Secondary | ICD-10-CM | POA: Diagnosis not present

## 2016-02-19 ENCOUNTER — Telehealth: Payer: Self-pay | Admitting: *Deleted

## 2016-02-19 LAB — BASIC METABOLIC PANEL
BUN / CREAT RATIO: 19 (ref 10–22)
BUN: 15 mg/dL (ref 8–27)
CO2: 23 mmol/L (ref 18–29)
CREATININE: 0.79 mg/dL (ref 0.76–1.27)
Calcium: 8.8 mg/dL (ref 8.6–10.2)
Chloride: 104 mmol/L (ref 96–106)
GFR calc Af Amer: 102 mL/min/{1.73_m2} (ref >59)
GFR, EST NON AFRICAN AMERICAN: 88 mL/min/{1.73_m2} (ref >59)
Glucose: 85 mg/dL (ref 65–99)
Potassium: 4.5 mmol/L (ref 3.5–5.2)
SODIUM: 141 mmol/L (ref 134–144)

## 2016-02-19 NOTE — Telephone Encounter (Signed)
Called and spoke to wife. Advised labs normal per Dr Jaynee Eagles. Reason she checked labs again was because he c/o dizziness and stated he was not drinking enough water. Dr Jaynee Eagles wanted to make sure he was not dehydrated. Labs from January would not reflect this. She had to do most recent labs to check for this. She verbalized understanding and appreciated.

## 2016-02-19 NOTE — Telephone Encounter (Signed)
LVM for pt to call about results. Gave [facility] phone number. Ok to inform pt labs normal per Dr Jaynee Eagles.

## 2016-02-19 NOTE — Telephone Encounter (Signed)
Pt's wife called back. She was told his labs were normal. She is requesting a call from Dr. Jaynee Eagles to discuss why labs were drawn. She states he had labs done with his pcp in January and doesn't know why records could not have been requested instead of having it done again. She is upset with having to pay for it again. Please call and advise [phone]

## 2016-02-19 NOTE — Telephone Encounter (Signed)
-----   Message from [name], MD sent at 02/19/2016 11:39 AM EST ----- Lab normal thanks

## 2016-02-20 ENCOUNTER — Telehealth: Payer: Self-pay | Admitting: Neurology

## 2016-02-20 NOTE — Telephone Encounter (Signed)
Called husband back. Advised I LVM yesterday, not today. But his wife already returned call yesterday. He verbalized understanding. He agrees this is what happened.

## 2016-02-20 NOTE — Telephone Encounter (Signed)
Pt called stating he was returning Dr. Cathren Laine call from today. He is not sure why she is calling. He is requesting a call from her to discuss. Thank you

## 2016-05-15 DIAGNOSIS — Z1210 Encounter for screening for malignant neoplasm of intestinal tract, unspecified: Secondary | ICD-10-CM | POA: Diagnosis not present

## 2016-05-29 DIAGNOSIS — K5730 Diverticulosis of large intestine without perforation or abscess without bleeding: Secondary | ICD-10-CM | POA: Diagnosis not present

## 2016-05-29 DIAGNOSIS — D123 Benign neoplasm of transverse colon: Secondary | ICD-10-CM | POA: Diagnosis not present

## 2016-05-29 DIAGNOSIS — Z1211 Encounter for screening for malignant neoplasm of colon: Secondary | ICD-10-CM | POA: Diagnosis not present

## 2016-05-29 DIAGNOSIS — K635 Polyp of colon: Secondary | ICD-10-CM | POA: Diagnosis not present

## 2016-08-20 ENCOUNTER — Ambulatory Visit (INDEPENDENT_AMBULATORY_CARE_PROVIDER_SITE_OTHER): Payer: PPO | Admitting: Neurology

## 2016-08-20 ENCOUNTER — Encounter: Payer: Self-pay | Admitting: Neurology

## 2016-08-20 VITALS — BP 107/70 | HR 61 | Ht 67.0 in | Wt 198.8 lb

## 2016-08-20 DIAGNOSIS — G20 Parkinson's disease: Secondary | ICD-10-CM | POA: Diagnosis not present

## 2016-08-20 DIAGNOSIS — I951 Orthostatic hypotension: Secondary | ICD-10-CM | POA: Diagnosis not present

## 2016-08-20 DIAGNOSIS — G903 Multi-system degeneration of the autonomic nervous system: Secondary | ICD-10-CM | POA: Diagnosis not present

## 2016-08-20 MED ORDER — FLUDROCORTISONE ACETATE 0.1 MG PO TABS: 0.1000 mg | ORAL_TABLET | Freq: Every day | ORAL | 12 refills | Status: DC

## 2016-08-20 NOTE — Progress Notes (Signed)
[id number]    Provider:  Dr Kyle Mcguire Referring Provider: Vernie Shanks, MD Primary Care Physician:  Kyle Harada, MD  CC: PD  HPI: Kyle Mcguire is a wonderful 75 y.o. male here as a follow up for PD he reports dizziness. His BP is rather low and he is on Sinemet 4x a day.   BP:    Interval history 08/2016: Tremor is good. He is dizzy all the time. Things are blurry when he looks at them. He feels dizzy even now when sitting. His orthostatics have been normal. He takes the carbidopa/levodopa 4 x a day. He last took the medication at 8am this morning and his tremor is fine. Will go back to 3 pills of Sinemet a day and see if this improved his dizziness. if needed, florinef 0.1, 1/2 pill up to bid for starters  Interval history 01/2016: He is on 1 tab 4 times a day every 4 hours. His tremor is better. He takes the medication around 8am he feels the tremor about 10 - 10:30. Discussed that can take it every 3 hours instead. He feels the medication wearing off. However very mild tremor right now and he took his pill at 8am. He is having dizziness all the time. That started a month ago. There was no change in medication a month ago. Worse when moving. He is not drinking enough water. In the morning he has coffee. At lunch time he has a sandwhich or half a sandwich and lemonade. And a full glass of water with his supper. No room spinning. He is lightheaded, no CP, no SOB. No falls. No dysphagia. Not with sitting or laying. More upon standing and ambulating.   Interval history 07/2015: He took the sinemet this morning at 7:30am. The tremor is better right now. The sinemet is helping it, it just wears off. Not hard for him to take it 3x a day. Once in a while he forgets. He doesn't know how long the sinemet lasts. No dizziness when he stands up. Not lightheaded, no symptoms fron the sinemet. Right now at this moment he is happy with his tremor, last took the Sinemet a few  hours ago. He gets up at 7:30 amd walks to sheets. He goes to bed between 10 and11pm. Feels like his voice is getting lower. No problems with sleeping or vivid dreams. No falls. Sense of smell and taste is ok. No dysphagia. No memory changes. No hallucinations. No falls. No drooling. Noticed slight tremor in the right hand by the end of the appointment.   Vould try taking the Sinemet every 4 hours. 8am, noon, 4pm and 8pm. In the morning his handwriting is worse but he takes the sinemet and that is fine, he doesn't need a long-acting at night. But right  Interval update 04/26/2015: He stopped the Sinemet 2 weeks ago. Says it didn't help. Explained that we may need to increase it. Discussed in depth the plan to slowly increase the Sinemet, that this is medication for PD but we need to increase slowly. If it didn't help, dose was too low and we had plans to increase. No side effects.    Visit 3/3: Kyle Mcguire is a 75 y.o. male here as a follow up.   Azilect not helping with the tremor and making him dizzy. Otherwise symptoms are the same.   Reviewed notes, labs and imaging from outside physicians, which showed:  MR Brsain 12/2014: This is a borderline normal MRI of  the brain without contrast showing a mild extent of white matter foci that are likely due to age-related microvascular change. There is associated minimal atrophy. There are no acute findings.  Initial visit 12/27/2014: Kyle Mcguire is a 75 y.o. male with no significant PMHx who is here as a consult from Dr. Jacelyn Mcguire for Tremors. Tremors started 8-9 months ago. Started in the right hand. He noticed it when he tried to write, penmanship was smaller and shaky. Staying about the same. Left arm is fine. Walking fine. Sense of smell ok. Has constipation. He just moved here from [place]. Lives in a town house. Plays golf, lives independently. Can't read his writing anymore. No stiffness. Father had a tremor. Balance is getting poor, if  he bends down he may fall over. Vision is changing, "hazy" vision when driving at night. Worked in trucking for 33 years. He can't pick up as much as he used to. He is very forgetful. He will forget recent conversations, his wife will tell him something and a few hours later he may forget. Dad with Parkinson's Disease. Tremor is mostly at rest or when writing. Was walking the dog and the dog went quickly and he couldn't stop himself from falling down, and broke his wrist. Sometimes when he speeds up he can't stop, has to "stutter step" to turn. No neck pain. He takes daily aspirin, no history of stroke or TIA. Denies DM or heart disease. He takes no medication. He does not drink alcohol and never smoked.   Reviewed notes, labs and imaging from outside physicians, which showed: Was sent from [facility] for evaluation of tremor. CMP wnl, LDL 98,   MRI of the brain: This is a borderline normal MRI of the brain without contrast showing a mild extent of white matter foci that are likely due to age-related microvascular change. There is associated minimal atrophy. There are no acute findings.  Review of Systems: Patient complains of symptoms per HPI as well as the following symptoms: Tremor, no chest pain, no dizziness, no shortness of breath. Pertinent negatives per HPI. All others negative.   Social History   Social History  . Marital status: Married    Spouse name: Kyle Mcguire   . Number of children: 2  . Years of education: 12   Occupational History  . Retired     Social History Main Topics  . Smoking status: Never Smoker  . Smokeless tobacco: Never Used  . Alcohol use No  . Drug use: No  . Sexual activity: Not on file   Other Topics Concern  . Not on file   Social History Narrative   Patient lives at home with wife Kyle Mcguire   Patient has 2 children    Patient has a high school education    Patient is right handed    Patient is retired    Caffeine use: 1 cup coffee.day     Family  History  Problem Relation Age of Onset  . Parkinsonism Father     Past Medical History:  Diagnosis Date  . Hearing loss     Past Surgical History:  Procedure Laterality Date  . no surgical history per patient    . None      Current Outpatient Prescriptions  Medication Sig Dispense Refill  . aspirin 81 MG chewable tablet Chew 81 mg by mouth daily.    . carbidopa-levodopa (SINEMET) 25-100 MG tablet Take 1 tablet by mouth 4 (four) times daily. 120 tablet 11   No  current facility-administered medications for this visit.     Allergies as of 08/20/2016  . (No Known Allergies)    Vitals: BP 107/70 (BP Location: Right Arm, Patient Position: Sitting, Cuff Size: Normal)   Pulse 61   Ht 5\' 7"  (1.702 m)   Wt 198 lb 12.8 oz (90.2 kg)   BMI 31.14 kg/m  Last Weight:  Wt Readings from Last 1 Encounters:  08/20/16 198 lb 12.8 oz (90.2 kg)   Last Height:   Ht Readings from Last 1 Encounters:  08/20/16 5\' 7"  (1.702 m)       Cranial Nerves: decreased blink  The pupils are equal, round, and reactive to light. Visual fields are full to finger confrontation. Extraocular movements are intact. Trigeminal sensation is intact and the muscles of mastication are normal. The face is symmetric. The palate elevates in the midline. Reduced hearing bilat. Voice is normal. Shoulder shrug is normal. The tongue has normal motion without fasciculations.   Coordination:  Normal rapid alternating movements. 2-3 steps back with push test  Gait:  Reduced arm swing. Minimally shuffling.   Motor Observation:  Mild resting tremor with Intention and postural components that is improved today since he is on the Sinemet Tone:  cogwheeling uppers with facilitation improved  Posture:  Mildly stooped   Strength:  Strength is V/V in the upper and lower limbs.       Orthostatic VS for the past 24 hrs (Last 3 readings):  BP- Lying Pulse- Lying BP- Sitting Pulse- Sitting BP-  Standing at 3 minutes Pulse- Standing at 3 minutes  08/20/16 1203 121/72 57 115/78 62 95/68 67   Assessment/Plan: 76 year old wonderful male here for evaluation of tremor, worsening penmanship, balance problems, falls. Exam shows parkinsonian symptoms, stooped with mild shuffling and reduced arm swing, cogwheeling with facilitation. MRI of the brain unremarkable. Patient is orthostatic today in the office. Advised to drink more water. We'll start Florinef twice a day.  Parkinson's disease:  Continue Sinemet. 1 pills three four time a day (8am, 11am, 2pm and 5pm for example). Space 3-4 hours apart. Try to separate Sinemet from food (especially protein-rich foods like meat, dairy, eggs) by about 30-60 mins - this will help the absorption of the medication. If you have some nausea with the medication, you can take it with some light food like crackers or ginger ale. Power over PD group, information given Dizziness: Orthostatic hypotension. This can be due to hypovolemia but is also common in Parkinson's disease and with Sinemet i.e. may be neurogenic in nature. -drink more water. Will check labs today - orthostatic vitals showed orthostatic vs. neurogenic hypotension.. Could be secondary to medication, dehydration and can also ne seen in PD. Will start Florinef today. If still dizzy may consider Midodrine. Also Northera could be considered.  florinef 0.1, 1/2 pill up to bid for starters and can increase to a whole pill.  Cc: Dr. Tye [place], MD  [facility] [address] [place], [place] [address]  Phone [phone] Fax [phone]  A total of 30 minutes was spent face-to-face with this patient. Over half this time was spent on counseling patient on the Parkinson's disease and orthostatic hypotension diagnosis and different diagnostic and therapeutic options available.

## 2016-08-20 NOTE — Patient Instructions (Addendum)
Remember to drink plenty of fluid, eat healthy meals and do not skip any meals. Try to eat protein with a every meal and eat a healthy snack such as fruit or nuts in between meals. Try to keep a regular sleep-wake schedule and try to exercise daily, particularly in the form of walking, 20-30 minutes a day, if you can.   As far as your medications are concerned, I would like to suggest: Florinef 1/2 tablet twice daily. Call me in 4 weeks and we may increase. Continue Sinemet at current dose.   I would like to see you back in 3 months, sooner if we need to. Please call us with any interim questions, concerns, problems, updates or refill requests.   Our phone number is [phone]. We also have an after hours call service for urgent matters and there is a physician on-call for urgent questions. For any emergencies you know to call 911 or go to the nearest emergency room  Fludrocortisone tablets What is this medicine? FLUDROCORTISONE (floo droe KOR ti sone) is a corticosteroid. It is used to treat Addison's disease and to treat a salt losing condition called adrenogenital syndrome. This medicine may be used for other purposes; ask your health care provider or pharmacist if you have questions. What should I tell my health care provider before I take this medicine? They need to know if you have any of these conditions: -Cushing's syndrome -diabetes -heart problems or disease -high blood pressure -infection like herpes, measles, tuberculosis, or chickenpox -liver disease -myasthenia gravis -osteoporosis -stomach, ulcer or intestine disease including colitis and diverticulitis -thyroid problem -an unusual or allergic reaction to fludrocortisone, corticosteroids, other medicines, lactose, foods, dyes, or preservatives -pregnant or trying to get pregnant -breast-feeding How should I use this medicine? Take this medicine by mouth with a glass of water. Follow the directions on the prescription  label. Take it with food or milk to avoid stomach upset. If you are taking this medicine once a day, take it in the morning. Do not take more medicine than you are told to take. Do not suddenly stop taking your medicine because you may develop a severe reaction. Your doctor will tell you how much medicine to take. If your doctor wants you to stop the medicine, the dose may be slowly lowered over time to avoid any side effects. Talk to your pediatrician regarding the use of this medicine in children. Special care may be needed. Patients over 85 years old may have a stronger reaction and need a smaller dose. Overdosage: If you think you have taken too much of this medicine contact a poison control center or emergency room at once. NOTE: This medicine is only for you. Do not share this medicine with others. What if I miss a dose? If you miss a dose, take it as soon as you can. If it is almost time for your next dose, take only that dose. Do not take double or extra doses. What may interact with this medicine? Do not take this medicine with any of the following medications: -mifepristone, RU-486 This medicine may also interact with the following medications: -amphotericin B -aspirin and aspirin-like drugs -barbiturates like phenobarbital -digoxin -diuretics -male hormones, like estrogens or progestins and birth control pills -male hormones -medicines for diabetes like insulin -medicines that treat or prevent blood clots like warfarin -phenytoin -rifampin -vaccines This list may not describe all possible interactions. Give your health care provider a list of all the medicines, herbs, non-prescription drugs, or dietary  supplements you use. Also tell them if you smoke, drink alcohol, or use illegal drugs. Some items may interact with your medicine. What should I watch for while using this medicine? Visit your doctor or health care professional for regular checks on your progress. If you are  taking this medicine over a prolonged period, carry an identification card with your name and address, the type and dose of your medicine, and your doctor's name and address. This medicine may increase your risk of getting an infection. Stay away from people who are sick. Tell your doctor or health care professional if you are around anyone with measles or chickenpox. If you are going to have surgery, tell your doctor or health care professional that you have taken this medicine within the last twelve months. Ask your doctor or health care professional about your diet. You may need to lower the amount of salt you eat. The medicine can increase your blood sugar. If you are a diabetic check with your doctor if you need help adjusting the dose of your diabetic medicine. What side effects may I notice from receiving this medicine? Side effects that you should report to your doctor or health care professional as soon as possible: -changes in vision -mental depression, mood swings, mistaken feelings of self importance or of being mistreated -sudden weight gain -swelling of the feet or lower legs -unusually weak or tired Side effects that usually do not require medical attention (report to your doctor or health care professional if they continue or are bothersome): -dizziness -headache -loss of appetite -nausea, vomiting -trouble sleeping This list may not describe all possible side effects. Call your doctor for medical advice about side effects. You may report side effects to FDA at 1-800-FDA-1088. Where should I keep my medicine? Keep out of the reach of children. Store at room temperature between 15 and 30 degrees C (59 and 86 degrees F). Protect from excessive heat. Throw away any unused medicine after the expiration date. NOTE: This sheet is a summary. It may not cover all possible information. If you have questions about this medicine, talk to your doctor, pharmacist, or health care provider.     2016, Elsevier/Gold Standard. (2008-04-10 15:29:43)

## 2016-08-21 ENCOUNTER — Telehealth: Payer: Self-pay | Admitting: Neurology

## 2016-08-21 NOTE — Telephone Encounter (Signed)
Dr Jaynee Eagles- please advise  Called wife back. She wanted to know what was discussed at [facility] yesterday.  Relayed per Dr Cathren Laine [facility] note that : "Parkinson's disease:  Continue Sinemet. 1 pills three four time a day (8am, 11am, 2pm and 5pm for example). Space 3-4 hours apart.   Dizziness: Orthostatic hypotension. This can be due to hypovolemia but is also common in Parkinson's disease and with Sinemet  - orthostatic vitals showed orthostatic vs. neurogenic hypotension.. Could be secondary to medication, dehydration and can also be seen in PD. Will start Florinef today. If still dizzy may consider Midodrine. Also Northera could be considered.  florinef 0.1, 1/2 pill up to bid for starters and can increase to a whole pill."   She was looking over instructions and she stated she noticed that it said patient should not take florinef if they are on aspirin. She states he is on a baby aspirin and wondering if he should take florinef. Advised I will speak to Dr Jaynee Eagles and call back to advise. She verbalized understanding.  She also stated she knows the dizziness started after he started the sinemet. Advised this can be a side effect of medication.

## 2016-08-21 NOTE — Telephone Encounter (Signed)
The pt's wife called to advise he came home from appt yesterday with papers. She said he could not remember what was discussed at the [facility]. She said he will gone for about 1 hr, she can not talk when he is at home. Please call

## 2016-08-22 NOTE — Telephone Encounter (Signed)
Per Dr Jaynee Eagles- if wife calls back, she would like me to relay message.

## 2016-08-22 NOTE — Telephone Encounter (Signed)
Tried calling back. Wife is still sleeping. I can;t review the entire appointment with wife, she has called other times after appointments, she needs to come with him as I have discussed with patient. We discussed in the appointment yesterday decreasing his Sinemet(from 4x a day to 3x a day) as this can cause dizziness and orthostatic hypotension but this would impact his tremor as he said the medication was wearing off at 3x a day which is why we increased him to 4x a day. I also gave him the option of trying this medication(florinef) which helps support blood pressure so he could continue taking the Sinement 4x a day. He can take the florinef, take with food in the morning and in the afternoon 1/2 pill and we can monitor him on it. He can take it with aspirin we just need to watch for stomach upset. Please let wife know. Thanks.

## 2016-09-10 DIAGNOSIS — H2513 Age-related nuclear cataract, bilateral: Secondary | ICD-10-CM | POA: Diagnosis not present

## 2016-09-16 ENCOUNTER — Telehealth: Payer: Self-pay | Admitting: Neurology

## 2016-09-16 DIAGNOSIS — G20 Parkinson's disease: Secondary | ICD-10-CM | POA: Diagnosis not present

## 2016-09-16 DIAGNOSIS — M545 Low back pain: Secondary | ICD-10-CM | POA: Diagnosis not present

## 2016-09-16 NOTE — Telephone Encounter (Signed)
Dr Jaynee Eagles- how would you like them to proceed? You advised them to call after 4 weeks.  Called and spoke to patient's wife. She stated they have monitored his BP for the past month. She has values written down. She is not sure if Dr Jaynee Eagles wanted them to come in for appt or if she can give values over the phone. She states his  BP today at [facility] was 162/92 and at his PCP today it was 120/80. Advised I will speak with Dr Jaynee Eagles and call back to let them know. She states he is feeling about the same.

## 2016-09-16 NOTE — Telephone Encounter (Signed)
Pt's wife called in about b/p checks and was under the impression the pt needed to come in or can the b/p's be given over the phone. Please call and advise [phone]

## 2016-09-16 NOTE — Telephone Encounter (Signed)
Schedule an appointment for them and make sure wife comes thanks

## 2016-09-17 NOTE — Telephone Encounter (Signed)
Called and spoke to pt wife. Schedule f/u for 10.12 at 1pm, check in 1245pm. Dr Jaynee Eagles ok with this. Wife verbalized understanding.

## 2016-09-25 ENCOUNTER — Ambulatory Visit (INDEPENDENT_AMBULATORY_CARE_PROVIDER_SITE_OTHER): Payer: PPO | Admitting: Neurology

## 2016-09-25 ENCOUNTER — Encounter: Payer: Self-pay | Admitting: Neurology

## 2016-09-25 VITALS — BP 160/83 | HR 60 | Ht 67.0 in | Wt 203.8 lb

## 2016-09-25 DIAGNOSIS — I951 Orthostatic hypotension: Secondary | ICD-10-CM | POA: Diagnosis not present

## 2016-09-25 DIAGNOSIS — G20 Parkinson's disease: Secondary | ICD-10-CM

## 2016-09-25 NOTE — Progress Notes (Signed)
[id number]    Provider:  Dr Jaynee Eagles Referring Provider: Vernie Shanks, MD Primary Care Physician:  Anthoney Harada, MD  CC: PD  HPI: Kyle Mcguire is a wonderful 75 y.o. male here as a follow up for parkinson's disease. He is doing on Sinemet. 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 He was started on Florinef at last appointment and comes in today with his wife for the first time to discuss.  Interval history 09/25/2016:  Patient comes in with wife today, this the first time I'm meeting her. We reviewed the patient's history and the medications we have tried. Patient's tremor responds to Sinemet. In the past we had him on 3 pills a day and patient felt as though the Effexor good but he felt them wearing off during the day. We increased him to 4 pills. Discussed the proper way to take it, without protein as this might interfere with absorption. Also advised he can take the pills every 3-4 hours. I asked his wife to notice when she sees the tremor improved after taking medication and if she notices the medications wearing off. Also had a long discussion about hydration, patient drinks several Cokes daily but does not drink much fluid. I advised that this may be what is causing his dizziness when he stands up. Orthostatic hypotension can also be seen in  Parkinson's disease or caused by Sinemet. Asked them to keep a fluids diary. The Florinef is making his blood pressure too high advised him to stop it.  Interval history 08/2016: Tremor is good. He is dizzy all the time. Things are blurry when he looks at them. He feels dizzy even now when sitting. His orthostatics have been normal. He takes the carbidopa/levodopa 4 x a day. He last took the medication at 8am this morning and his tremor is fine. Will go back to 3 pills of Sinemet a day and see if this improved his dizziness. if needed, florinef 0.1, 1/2 pill up to bid  for starters  Interval history 01/2016: He is on 1 tab 4 times a day every 4 hours. His tremor is better. He takes the medication around 8am he feels the tremor about 10 - 10:30. Discussed that can take it every 3 hours instead. He feels the medication wearing off. However very mild tremor right now and he took his pill at 8am. He is having dizziness all the time. That started a month ago. There was no change in medication a month ago. Worse when moving. He is not drinking enough water. In the morning he has coffee. At lunch time he has a sandwhich or half a sandwich and lemonade. And a full glass of water with his supper. No room spinning. He is lightheaded, no CP, no SOB. No falls. No dysphagia. Not with sitting or laying. More upon standing and ambulating.   Interval history 07/2015:He took the sinemet this morning at 7:30am. The tremor is better right now. The sinemet is helping it, it just wears off. Not hard for him to take it 3x a day. Once in a while he forgets. He doesn't know how long the sinemet lasts. No dizziness when he stands up. Not lightheaded, no symptoms fron the sinemet. Right now at this moment he is happy with his tremor, last took the Sinemet a few hours ago. He gets up at 7:30 amd walks to sheets. He goes to bed between 10 and11pm. Feels like his voice is getting lower. No  problems with sleeping or vivid dreams. No falls. Sense of smell and taste is ok. No dysphagia. No memory changes. No hallucinations. No falls. No drooling. Noticed slight tremor in the right hand by the end of the appointment.   Vould try taking the Sinemet every 4 hours. 8am, noon, 4pm and 8pm. In the morning his handwriting is worse but he takes the sinemet and that is fine, he doesn't need a long-acting at night. But right  Interval update 04/26/2015:He stopped the Sinemet 2 weeks ago. Says it didn't help. Explained that we may need to increase it. Discussed in depth the plan to slowly increase the  Sinemet, that this is medication for PD but we need to increase slowly. If it didn't help, dose was too low and we had plans to increase. No side effects.    Visit 3/3: Kyle Mcguire is a 75 y.o. male here as a follow up.   Azilect not helping with the tremor and making him dizzy. Otherwise symptoms are the same.   Reviewed notes, labs and imaging from outside physicians, which showed:  MR [facility] 12/2014: This is a borderline normal MRI of the brain without contrast showing a mild extent of white matter foci that are likely due to age-related microvascular change. There is associated minimal atrophy. There are no acute findings.  Initial visit 12/27/2014: Kyle Mcguire is a 75 y.o. male with no significant PMHx who is here as a consult from Dr. Jacelyn Grip for Tremors. Tremors started 8-9 months ago. Started in the right hand. He noticed it when he tried to write, penmanship was smaller and shaky. Staying about the same. Left arm is fine. Walking fine. Sense of smell ok. Has constipation. He just moved here from [place]. Lives in a town house. Plays golf, lives independently. Can't read his writing anymore. No stiffness. Father had a tremor. Balance is getting poor, if he bends down he may fall over. Vision is changing, "hazy" vision when driving at night. Worked in trucking for 33 years. He can't pick up as much as he used to. He is very forgetful. He will forget recent conversations, his wife will tell him something and a few hours later he may forget. Dad with Parkinson's Disease. Tremor is mostly at rest or when writing. Was walking the dog and the dog went quickly and he couldn't stop himself from falling down, and broke his wrist. Sometimes when he speeds up he can't stop, has to "stutter step" to turn. No neck pain. He takes daily aspirin, no history of stroke or TIA. Denies DM or heart disease. He takes no medication. He does not drink alcohol and never smoked.   Reviewed notes, labs and  imaging from outside physicians, which showed: Was sent from [facility] for evaluation of tremor. CMP wnl, LDL 98,   MRI of the brain: This is a borderline normal MRI of the brain without contrast showing a mild extent of white matter foci that are likely due to age-related microvascular change. There is associated minimal atrophy. There are no acute findings.  Review of Systems: Patient complains of symptoms per HPI as well as the following symptoms: Tremor, no chest pain, no dizziness, no shortness of breath. Pertinent negatives per HPI. All others negative.     Social History   Social History  . Marital status: Married    Spouse name: [name]   . Number of children: 2  . Years of education: 12   Occupational History  . Retired  Social History Main Topics  . Smoking status: Never Smoker  . Smokeless tobacco: Never Used  . Alcohol use No  . Drug use: No  . Sexual activity: Not on file   Other Topics Concern  . Not on file   Social History Narrative   Patient lives at home with wife [name]   Patient has 2 children    Patient has a high school education    Patient is right handed    Patient is retired    Caffeine use: 1 cup coffee.day     Family History  Problem Relation Age of Onset  . Parkinsonism Father     Past Medical History:  Diagnosis Date  . Hearing loss     Past Surgical History:  Procedure Laterality Date  . no surgical history per patient    . None      Current Outpatient Prescriptions  Medication Sig Dispense Refill  . aspirin 81 MG chewable tablet Chew 81 mg by mouth daily.    . carbidopa-levodopa (SINEMET) 25-100 MG tablet Take 1 tablet by mouth 4 (four) times daily. 120 tablet 11  . fludrocortisone (FLORINEF) 0.1 MG tablet Take 1 tablet (0.1 mg total) by mouth daily. Start with 1/2 tablet twice a day. Check blood pressure daily. Can increase if needed. 60 tablet 12  . meloxicam (MOBIC) 15 MG tablet Take 1 tablet by mouth daily.     No  current facility-administered medications for this visit.     Allergies as of 09/25/2016  . (No Known Allergies)    Vitals: BP (!) 160/83 (BP Location: Right Arm, Patient Position: Sitting, Cuff Size: Normal)   Pulse 60   Ht 5\' 7"  (1.702 m)   Wt 203 lb 12.8 oz (92.4 kg)   BMI 31.92 kg/m  Last Weight:  Wt Readings from Last 1 Encounters:  09/25/16 203 lb 12.8 oz (92.4 kg)   Last Height:   Ht Readings from Last 1 Encounters:  09/25/16 5\' 7"  (1.702 m)     Cranial Nerves: decreased blink  The pupils are equal, round, and reactive to light. Visual fields are full to finger confrontation. Extraocular movements are intact. Trigeminal sensation is intact and the muscles of mastication are normal. The face is symmetric. The palate elevates in the midline. Reduced hearing bilat. Voice is normal. Shoulder shrug is normal. The tongue has normal motion without fasciculations.   Coordination:  Normal rapid alternating movements. 2-3 steps back with push test  Gait:  Reduced arm swing. Minimally shuffling.   Motor Observation:  Mild resting tremor with Intention and postural components that is improved today since he is on the Sinemet Tone:  cogwheeling uppers with facilitation improved  Posture:  Mildly stooped   Strength:  Strength is V/V in the upper and lower limbs.       Orthostatic VS for the past 24 hrs (Last 3 readings):  BP- Lying Pulse- Lying BP- Sitting Pulse- Sitting BP- Standing at 3 minutes Pulse- Standing at 3 minutes  08/20/16 1203 121/72 57 115/78 62 95/68 67   Today: Lying 170/90 Sitting 162/90 Standing 158/85  Assessment/Plan: 75 year old wonderful male here for evaluation of tremor, worsening penmanship, balance problems, falls. Exam shows parkinsonian symptoms, stooped with mild shuffling and reduced arm swing, cogwheeling with facilitation, resting tremor. MRI of the brain unremarkable. Patient is orthostatic today in the  office. Advised to drink more water. We'll discontinue Florinef because his blood pressure is too elevated. He is here with his  wife today who says that patient does not adequately hydrated during the day and only drinks several cokes. Highly encourage keeping a fluids diary and drinking 6-8 glasses of water a day or until his urine is light clear yellow.  Parkinson's disease:  Continue Sinemet. 1 pills four time a day (8am, 11am, 2pm and 5pm for example). Space 3-4 hours apart. Try to separate Sinemet from food (especially protein-rich foods like meat, dairy, eggs) by about 30-60 mins - this will help the absorption of the medication. If you have some nausea with the medication, you can take it with some light food like crackers or ginger ale. Power over PD group, information given. Document when he noticed the medication wearing off.  Dizziness: Orthostatic hypotension. This can be due to hypovolemia but is also common in Parkinson's disease and with Sinemet i.e. may be neurogenic in nature. -drink more water. Keep a fluids diary. Her wife is not hydrating only drinking several cokes a day. - orthostatic vitals showed orthostatic vs. neurogenic hypotension.. Could be secondary to medication, dehydration and can also ne seen in PD.  - If fluids to make a difference can consider [place] there are. Today he is orthostatic again today in the office.   Sarina Ill, MD  [facility] [address] [place], [place] [address]  Phone [phone] Fax [phone]  A total of 40 minutes was spent face-to-face with this patient. Over half this time was spent on counseling patient on the Parkinson's disease, orthostatic hypotension diagnosis and different diagnostic and therapeutic options available.

## 2016-09-25 NOTE — Patient Instructions (Addendum)
Remember to drink plenty of fluid, eat healthy meals and do not skip any meals. Try to eat protein with a every meal and eat a healthy snack such as fruit or nuts in between meals. Try to keep a regular sleep-wake schedule and try to exercise daily, particularly in the form of walking, 20-30 minutes a day, if you can.   As far as your medications are concerned, I would like to suggest: Stop Fluorinef (Fludrocortisone)  ORTHOSTATIC HYPOTENSION  I would like to see you back in 4 months, sooner if we need to. Please call us with any interim questions, concerns, problems, updates or refill requests.   Our phone number is [phone]. We also have an after hours call service for urgent matters and there is a physician on-call for urgent questions. For any emergencies you know to call 911 or go to the nearest emergency room

## 2016-10-29 ENCOUNTER — Telehealth: Payer: Self-pay | Admitting: Neurology

## 2016-10-29 NOTE — Telephone Encounter (Signed)
Pt's wife called in requesting a call back from Dr. Jaynee Eagles regarding pt's b/p. Please call [phone]. She called to r/s her husbands appt but when I gave her another date she did not want to change it. She was very vague as to why she wanted to speak with Dr. Jaynee Eagles.

## 2016-10-30 NOTE — Telephone Encounter (Signed)
Wife called to advise, disregard message from yesterday. She had wanted to reschedule husband's appointment due to a conflict, was able to get the other appointment worked out and can keep original appointment with Dr. Jaynee Eagles. States, Dr. Jaynee Eagles told them to call whenever they needed her.

## 2016-10-30 NOTE — Telephone Encounter (Signed)
Dr Ahern- FYI only 

## 2016-11-18 ENCOUNTER — Encounter: Payer: Self-pay | Admitting: Neurology

## 2016-11-18 ENCOUNTER — Ambulatory Visit: Payer: Self-pay | Admitting: Neurology

## 2016-11-18 ENCOUNTER — Ambulatory Visit (INDEPENDENT_AMBULATORY_CARE_PROVIDER_SITE_OTHER): Payer: PPO | Admitting: Neurology

## 2016-11-18 VITALS — BP 106/70 | HR 79 | Ht 67.0 in | Wt 202.0 lb

## 2016-11-18 DIAGNOSIS — G20 Parkinson's disease: Secondary | ICD-10-CM

## 2016-11-18 NOTE — Patient Instructions (Addendum)
Remember to drink plenty of fluid, eat healthy meals and do not skip any meals. Try to eat protein with a every meal and eat a healthy snack such as fruit or nuts in between meals. Try to keep a regular sleep-wake schedule and try to exercise daily, particularly in the form of walking, 20-30 minutes a day, if you can.   As far as your medications are concerned, I would like to suggest: Continue current medications  I would like to see you back in 6 months, sooner if we need to. Please call us with any interim questions, concerns, problems, updates or refill requests.   Our phone number is [phone]. We also have an after hours call service for urgent matters and there is a physician on-call for urgent questions. For any emergencies you know to call 911 or go to the nearest emergency room   

## 2016-11-18 NOTE — Progress Notes (Signed)
[facility]    Provider:  Dr Jaynee Eagles Referring Provider: Vernie Shanks, MD Primary Care Physician:  Anthoney Harada, MD   CC: PD  Interval history: He feels like the dizziness has improved. Discussed northera. He feels that the dizziness is not too significant. We tried Florinef but his bllod pressure was too elevated. He last took the Sinemet at 7 this morning and he has no tremor, then he takes it at noon and then at 7pm and he goes to sleep at 10pm. He feels under control. No risk of fallin gfrom the dizziness, he has been drinking more water and feels better and less dizzy. His blood pressures from the last month have ranged 120-143/ 77-89 which is excellent. Hydrating more. One appointment was 101/82.  3-4 glasses of water a day and lemonade as well. No swallowing issues. No falls.   HPI: Kyle Mcguire is a wonderful 75 y.o. male here as a follow up for parkinson's disease. He is doing on Sinemet. 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 He was started on Florinef at last appointment and comes in today with his wife for the first time to discuss.  Interval history 09/25/2016:  Patient comes in with wife today, this the first time I'm meeting her. We reviewed the patient's history and the medications we have tried. Patient's tremor responds to Sinemet. In the past we had him on 3 pills a day and patient felt as though the Effexor good but he felt them wearing off during the day. We increased him to 4 pills. Discussed the proper way to take it, without protein as this might interfere with absorption. Also advised he can take the pills every 3-4 hours. I asked his wife to notice when she sees the tremor improved after taking medication and if she notices the medications wearing off. Also had a long discussion about hydration, patient drinks several Cokes daily but does not drink much fluid. I advised that this may  be what is causing his dizziness when he stands up. Orthostatic hypotension can also be seen in  Parkinson's disease or caused by Sinemet. Asked them to keep a fluids diary. The Florinef is making his blood pressure too high advised him to stop it.  Interval history 08/2016: Tremor is good. He is dizzy all the time. Things are blurry when he looks at them. He feels dizzy even now when sitting. His orthostatics have been normal. He takes the carbidopa/levodopa 4 x a day. He last took the medication at 8am this morning and his tremor is fine. Will go back to 3 pills of Sinemet a day and see if this improved his dizziness. if needed, florinef 0.1, 1/2 pill up to bid for starters  Interval history 01/2016:He is on 1 tab 4 times a day every 4 hours. His tremor is better. He takes the medication around 8am he feels the tremor about 10 - 10:30. Discussed that can take it every 3 hours instead. He feels the medication wearing off. However very mild tremor right now and he took his pill at 8am. He is having dizziness all the time. That started a month ago. There was no change in medication a month ago. Worse when moving. He is not drinking enough water. In the morning he has coffee. At lunch time he has a sandwhich or half a sandwich and lemonade. And a full glass of water with his supper. No room spinning. He is lightheaded, no CP, no  SOB. No falls. No dysphagia. Not with sitting or laying. More upon standing and ambulating.   Interval history 07/2015:He took the sinemet this morning at 7:30am. The tremor is better right now. The sinemet is helping it, it just wears off. Not hard for him to take it 3x a day. Once in a while he forgets. He doesn't know how long the sinemet lasts. No dizziness when he stands up. Not lightheaded, no symptoms fron the sinemet. Right now at this moment he is happy with his tremor, last took the Sinemet a few hours ago. He gets up at 7:30 amd walks to sheets. He goes to bed between 10  and11pm. Feels like his voice is getting lower. No problems with sleeping or vivid dreams. No falls. Sense of smell and taste is ok. No dysphagia. No memory changes. No hallucinations. No falls. No drooling. Noticed slight tremor in the right hand by the end of the appointment.   Vould try taking the Sinemet every 4 hours. 8am, noon, 4pm and 8pm. In the morning his handwriting is worse but he takes the sinemet and that is fine, he doesn't need a long-acting at night. But right  Interval update 04/26/2015:He stopped the Sinemet 2 weeks ago. Says it didn't help. Explained that we may need to increase it. Discussed in depth the plan to slowly increase the Sinemet, that this is medication for PD but we need to increase slowly. If it didn't help, dose was too low and we had plans to increase. No side effects.    Visit 3/3: Kyle Mcguire is a 75 y.o. male here as a follow up.   Azilect not helping with the tremor and making him dizzy. Otherwise symptoms are the same.   Reviewed notes, labs and imaging from outside physicians, which showed:  MR [facility] 12/2014: This is a borderline normal MRI of the brain without contrast showing a mild extent of white matter foci that are likely due to age-related microvascular change. There is associated minimal atrophy. There are no acute findings.  Initial visit 12/27/2014: Kyle Mcguire is a 75 y.o. male with no significant PMHx who is here as a consult from Dr. Jacelyn Grip for Tremors. Tremors started 8-9 months ago. Started in the right hand. He noticed it when he tried to write, penmanship was smaller and shaky. Staying about the same. Left arm is fine. Walking fine. Sense of smell ok. Has constipation. He just moved here from [place]. Lives in a town house. Plays golf, lives independently. Can't read his writing anymore. No stiffness. Father had a tremor. Balance is getting poor, if he bends down he may fall over. Vision is changing, "hazy" vision when driving  at night. Worked in trucking for 33 years. He can't pick up as much as he used to. He is very forgetful. He will forget recent conversations, his wife will tell him something and a few hours later he may forget. Dad with Parkinson's Disease. Tremor is mostly at rest or when writing. Was walking the dog and the dog went quickly and he couldn't stop himself from falling down, and broke his wrist. Sometimes when he speeds up he can't stop, has to "stutter step" to turn. No neck pain. He takes daily aspirin, no history of stroke or TIA. Denies DM or heart disease. He takes no medication. He does not drink alcohol and never smoked.   Reviewed notes, labs and imaging from outside physicians, which showed: Was sent from [facility] for evaluation of  tremor. CMP wnl, LDL 98,   MRI of the brain: This is a borderline normal MRI of the brain without contrast showing a mild extent of white matter foci that are likely due to age-related microvascular change. There is associated minimal atrophy. There are no acute findings.  Review of Systems: Patient complains of symptoms per HPI as well as the following symptoms: Tremor, no chest pain, no dizziness, no shortness of breath. Pertinent negatives per HPI. All others negative.    Social History   Social History  . Marital status: Married    Spouse name: [name]   . Number of children: 2  . Years of education: 12   Occupational History  . Retired     Social History Main Topics  . Smoking status: Never Smoker  . Smokeless tobacco: Never Used  . Alcohol use No  . Drug use: No  . Sexual activity: Not on file   Other Topics Concern  . Not on file   Social History Narrative   Patient lives at home with wife [name]   Patient has 2 children    Patient has a high school education    Patient is right handed    Patient is retired    Caffeine use: 1 cup coffee.day     Family History  Problem Relation Age of Onset  . Parkinsonism Father     Past  Medical History:  Diagnosis Date  . Hearing loss     Past Surgical History:  Procedure Laterality Date  . no surgical history per patient    . None      Current Outpatient Prescriptions  Medication Sig Dispense Refill  . aspirin 81 MG chewable tablet Chew 81 mg by mouth daily.    . carbidopa-levodopa (SINEMET) 25-100 MG tablet Take 1 tablet by mouth 4 (four) times daily. 120 tablet 11   No current facility-administered medications for this visit.     Allergies as of 11/18/2016  . (No Known Allergies)    Vitals: BP 106/70 (BP Location: Right Arm, Patient Position: Sitting, Cuff Size: Normal)   Pulse 79   Ht 5\' 7"  (1.702 m)   Wt 202 lb (91.6 kg)   BMI 31.64 kg/m  Last Weight:  Wt Readings from Last 1 Encounters:  11/18/16 202 lb (91.6 kg)   Last Height:   Ht Readings from Last 1 Encounters:  11/18/16 5\' 7"  (1.702 m)     Cranial Nerves: decreased blink  The pupils are equal, round, and reactive to light. Visual fields are full to finger confrontation. Extraocular movements are intact. Trigeminal sensation is intact and the muscles of mastication are normal. The face is symmetric. The palate elevates in the midline. Reduced hearing bilat. Voice is normal. Shoulder shrug is normal. The tongue has normal motion without fasciculations.   Coordination:  Normal rapid alternating movements. 2-3 steps back with push test  Gait:  Reduced arm swing. Minimally shuffling.   Motor Observation:  Mild resting tremor with Intention and postural components that is improved today since he is on the Sinemet Tone:  cogwheeling uppers with facilitation improved  Posture:  Mildly stooped   Strength:  Strength is V/V in the upper and lower limbs.       Orthostatic VS for the past 24 hrs (Last 3 readings):  BP- Lying Pulse- Lying BP- Sitting Pulse- Sitting BP- Standing at 3 minutes Pulse- Standing at 3 minutes  08/20/16 1203 121/72 57 115/78 62  95/68 67   Today: Lying  170/90 Sitting 162/90 Standing 158/85  Assessment/Plan: 75 year old wonderful male here for evaluation of tremor, worsening penmanship, balance problems, falls. Exam shows parkinsonian symptoms, mild shuffling and reduced arm swing, cogwheeling with facilitation, resting tremor(improved with sinemet). MRI of the brain unremarkable. Patient has been orthostatic in the office, Advised to drink more water. We discontinued Florinef because his blood pressure is too elevated. He was here last appointment with his wife who said that patient does not adequately hydrate during the day and only drinks several cokes. Highly encouraged keeping a fluids diary and drinking 6-8 glasses of water a day or until his urine is light clear yellow.  Parkinson's disease:Continue Sinemet. 1 pills four time a day (8am, 11am, 2pm and 5pm for example). Space 3-4 hours apart. Try to separate Sinemet from food (especially protein-rich foods like meat, dairy, eggs) by about 30-60 mins - this will help the absorption of the medication. If you have some nausea with the medication, you can take it with some light food like crackers or ginger ale. Power over PD group, information given. Document when he noticed the medication wearing off.  Dizziness: Orthostatic hypotension.This can be due to hypovolemia but is also common in Parkinson's disease and with Sinemet i.e. may be neurogenic in nature. -drink more water. Keep a fluids diary. Her wife said not hydrating only drinking several cokes a day. He feels better with increased hydration - orthostatic vitals showed orthostatic vs. neurogenic hypotension.. Could be secondary to medication, dehydration and can also ne seen in PD.  - If orthostatic dizziness worsens can consider Northera. Today he feels improved, will not start now.   Sarina Ill, MD  [facility] [address] [place], [place] [address]  Phone  [phone] Fax [phone]  A total of 25 minutes was spent face-to-face with this patient. Over half this time was spent on counseling patient on the Parkinson's disease, orthostatic hypotension diagnosis and different diagnostic and therapeutic options available.

## 2016-11-20 ENCOUNTER — Ambulatory Visit: Payer: Self-pay | Admitting: Neurology

## 2016-12-09 DIAGNOSIS — R0981 Nasal congestion: Secondary | ICD-10-CM | POA: Diagnosis not present

## 2016-12-09 DIAGNOSIS — H6691 Otitis media, unspecified, right ear: Secondary | ICD-10-CM | POA: Diagnosis not present

## 2016-12-24 ENCOUNTER — Telehealth: Payer: Self-pay | Admitting: Neurology

## 2016-12-24 NOTE — Telephone Encounter (Addendum)
Called wife back. Wife states all he does is complain that something is wrong. He is inactive. Does not exercise. He used to be active and not complain. Needs advice on what to do from here.   She offers to have him come with her on walks, but he declines and says she walks too fast. They have just moved from [place]. Son has also tried to help him engage in things. Does not enjoy anything or go anywhere.   She is wanting to know good support groups for Dementia also if Dr Jaynee Eagles has any recommendations. She missed pt last appt because she was sick.  She is wanting to know if they should come in for appt to discuss or what she recommends from here.   He drinks a little more water but he does not drink much. I offered idea of filling water bottle and keeping that by him to remind him to drink but she states she "doubts that will work". She states he is stubborn and has always been.

## 2016-12-24 NOTE — Telephone Encounter (Signed)
Patient's wife is calling to discuss the patient's condition and would not give any details.  She can be reached at [phone].

## 2016-12-25 NOTE — Telephone Encounter (Signed)
It sounds like he is a little depressed. Why don't you schedule a follow up with me or meghan if I am booked out pretty far. I can recommend some physical therapy to try and get him out of the house if she likes, they would go to a PT center near their house. There is a good dementia support group (see below) but lets meet and discuss first I'm not sure he has dementia we should talk about it all thanks  [place] [address]  [place], [place] [address] 10:00 AM Gwendolyn Fill, 2nd Thursday of each month Please RSVP with Christianna Lovena Le, [facility] Caregiver Support Coordinator [phone] or [email or web address] Last updated: February 2017

## 2016-12-25 NOTE — Telephone Encounter (Signed)
LVM for wife to call back. Relayed AA,MD note below. Advised there is an opening on 01/07/17 at 8am, check in 745am with MM,NP for f/u. AA,MD booked out pretty far. Asked her to call back to let me know if this works. I will hold until I hear back. Gave [facility] phone number.

## 2016-12-26 NOTE — Telephone Encounter (Signed)
Tried calling wife back again since no return call. Went to automated message stating person I was trying to reach not accepting calls at this time. Unable to LVM.  *Please schedule f/u w/ MM,NP next available if she calls back and agreeable to this

## 2016-12-29 NOTE — Telephone Encounter (Signed)
Dr Jaynee Eagles- I have left two messages now. What would you like to do?

## 2017-01-09 DIAGNOSIS — L570 Actinic keratosis: Secondary | ICD-10-CM | POA: Diagnosis not present

## 2017-01-09 DIAGNOSIS — H6123 Impacted cerumen, bilateral: Secondary | ICD-10-CM | POA: Diagnosis not present

## 2017-01-09 DIAGNOSIS — G20 Parkinson's disease: Secondary | ICD-10-CM | POA: Diagnosis not present

## 2017-01-09 DIAGNOSIS — Z0000 Encounter for general adult medical examination without abnormal findings: Secondary | ICD-10-CM | POA: Diagnosis not present

## 2017-01-09 DIAGNOSIS — Z6833 Body mass index (BMI) 33.0-33.9, adult: Secondary | ICD-10-CM | POA: Diagnosis not present

## 2017-01-09 DIAGNOSIS — E669 Obesity, unspecified: Secondary | ICD-10-CM | POA: Diagnosis not present

## 2017-01-16 DIAGNOSIS — B078 Other viral warts: Secondary | ICD-10-CM | POA: Diagnosis not present

## 2017-01-16 DIAGNOSIS — L821 Other seborrheic keratosis: Secondary | ICD-10-CM | POA: Diagnosis not present

## 2017-01-21 DIAGNOSIS — Z13220 Encounter for screening for lipoid disorders: Secondary | ICD-10-CM | POA: Diagnosis not present

## 2017-01-21 DIAGNOSIS — G20 Parkinson's disease: Secondary | ICD-10-CM | POA: Diagnosis not present

## 2017-01-21 DIAGNOSIS — Z125 Encounter for screening for malignant neoplasm of prostate: Secondary | ICD-10-CM | POA: Diagnosis not present

## 2017-02-09 ENCOUNTER — Other Ambulatory Visit: Payer: Self-pay | Admitting: Neurology

## 2017-05-19 ENCOUNTER — Ambulatory Visit: Payer: Self-pay | Admitting: Neurology

## 2017-07-01 ENCOUNTER — Ambulatory Visit: Payer: Self-pay | Admitting: Neurology

## 2017-07-06 ENCOUNTER — Telehealth: Payer: Self-pay | Admitting: Neurology

## 2017-07-06 NOTE — Telephone Encounter (Signed)
Pt wife calling stating that pt is showing potential signs of Dementia and would like to discuss over the phone, she would like to know if there are test that could be run to determine.  Pt wife would like a call back in oppose to discussing in great detail in the presence of pt.

## 2017-07-07 ENCOUNTER — Encounter: Payer: Self-pay | Admitting: Neurology

## 2017-07-08 NOTE — Telephone Encounter (Signed)
Called and spoke w/ pt's wife. She feels that he is developing dementia and has had a change in personality. Offered a week sooner appt but wife declined, would like to keep appt as scheduled. Answered questions about dementia testing, treatment, symptoms, relation to PD and ways of going about upcoming visit w/ pt. Agreed to call back w/ worsening symptoms or any additional questions. Verbalized understanding and appreciation for call.

## 2017-08-03 DIAGNOSIS — M545 Low back pain: Secondary | ICD-10-CM | POA: Diagnosis not present

## 2017-08-14 DIAGNOSIS — H6692 Otitis media, unspecified, left ear: Secondary | ICD-10-CM | POA: Diagnosis not present

## 2017-08-14 DIAGNOSIS — H8110 Benign paroxysmal vertigo, unspecified ear: Secondary | ICD-10-CM | POA: Diagnosis not present

## 2017-08-14 DIAGNOSIS — H6501 Acute serous otitis media, right ear: Secondary | ICD-10-CM | POA: Diagnosis not present

## 2017-08-25 ENCOUNTER — Telehealth: Payer: Self-pay | Admitting: Neurology

## 2017-08-25 ENCOUNTER — Ambulatory Visit (INDEPENDENT_AMBULATORY_CARE_PROVIDER_SITE_OTHER): Payer: PPO | Admitting: Neurology

## 2017-08-25 DIAGNOSIS — R413 Other amnesia: Secondary | ICD-10-CM | POA: Diagnosis not present

## 2017-08-25 DIAGNOSIS — G4719 Other hypersomnia: Secondary | ICD-10-CM | POA: Diagnosis not present

## 2017-08-25 DIAGNOSIS — R0683 Snoring: Secondary | ICD-10-CM | POA: Diagnosis not present

## 2017-08-25 NOTE — Progress Notes (Signed)
[facility]    Provider:  Dr Jaynee Eagles Referring Provider: Vernie Shanks, MD Primary Care Physician:  Vernie Shanks, MD CC: PD  Interval history 08/25/2017: Had falls when on Flexeril but none since then because it made him dizzy. Still having dizziness. Memory changes started slowly 3-4 years ago and slowly progressive. Started with short-term memory issues. He would forget sometimes which road to take, little things. But not progressive and wosening. Wife has been managing his medications "for a while". Confusion worsens later in the day.He used to pay the bills but now his wife pays for them. Wife makes out the checks because of the shaking. Wife found bills in the past that were not paid. He is less social. He doesn't remember their anniversary anymore, he used to all the time. He is in a different room because of the snoring, he nods off a lot during the day. Wife is here and provides all information. He gets up and has breakfast, he broses through the paper and then he lays on the couch until 11am nodding off. He sits in the recliner all afternoon. Wife feels that he is developing dementia and has had a change in personality.has changed, he used to be very active, he used to work part time. He does take care of the laundry and cleans up after dinner. They started bowling, wife is trying to get him out more often.He is anxious and worrying a lot.He worries a lot. He has mellowed which is good. No hallucinations.     Interval history: He feels like the dizziness has improved. Discussed northera. He feels that the dizziness is not too significant. We tried Florinef but his bllod pressure was too elevated. He last took the Sinemet at 7 this morning and he has no tremor, then he takes it at noon and then at 7pm and he goes to sleep at 10pm. He feels under control. No risk of fallin gfrom the dizziness, he has been drinking more water and feels better and less dizzy. His blood  pressures from the last month have ranged 120-143/ 77-89 which is excellent. Hydrating more. One appointment was 101/82.  3-4 glasses of water a day and lemonade as well. No swallowing issues. No falls.   HPI: JAMARIOUS FEBO is a wonderful 76 y.o. male here as a follow up for parkinson's disease. He is doing on Sinemet. 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 He was started on Florinef at last appointment and comes in today with his wife for the first time to discuss.  Interval history 09/25/2016: Patient comes in with wife today, this the first time I'm meeting her. We reviewed the patient's history and the medications we have tried. Patient's tremor responds to Sinemet. In the past we had him on 3 pills a day and patient felt as though the Effexor good but he felt them wearing off during the day. We increased him to 4 pills. Discussed the proper way to take it, without protein as this might interfere with absorption. Also advised he can take the pills every 3-4 hours. I asked his wife to notice when she sees the tremor improved after taking medication and if she notices the medications wearing off. Also had a long discussion about hydration, patient drinks several Cokes daily but does not drink much fluid. I advised that this may be what is causing his dizziness when he stands up. Orthostatic hypotension can also be seen in Parkinson's disease or  caused by Sinemet. Asked them to keep a fluids diary. The Florinef is making his blood pressure too high advised him to stop it.  Interval history 08/2016: Tremor is good. He is dizzy all the time. Things are blurry when he looks at them. He feels dizzy even now when sitting. His orthostatics have been normal. He takes the carbidopa/levodopa 4 x a day. He last took the medication at 8am this morning and his tremor is fine. Will go back to 3 pills of Sinemet a day and see if this improved his dizziness. if  needed, florinef 0.1, 1/2 pill up to bid for starters  Interval history 01/2016:He is on 1 tab 4 times a day every 4 hours. His tremor is better. He takes the medication around 8am he feels the tremor about 10 - 10:30. Discussed that can take it every 3 hours instead. He feels the medication wearing off. However very mild tremor right now and he took his pill at 8am. He is having dizziness all the time. That started a month ago. There was no change in medication a month ago. Worse when moving. He is not drinking enough water. In the morning he has coffee. At lunch time he has a sandwhich or half a sandwich and lemonade. And a full glass of water with his supper. No room spinning. He is lightheaded, no CP, no SOB. No falls. No dysphagia. Not with sitting or laying. More upon standing and ambulating.   Interval history 07/2015:He took the sinemet this morning at 7:30am. The tremor is better right now. The sinemet is helping it, it just wears off. Not hard for him to take it 3x a day. Once in a while he forgets. He doesn't know how long the sinemet lasts. No dizziness when he stands up. Not lightheaded, no symptoms fron the sinemet. Right now at this moment he is happy with his tremor, last took the Sinemet a few hours ago. He gets up at 7:30 amd walks to sheets. He goes to bed between 10 and11pm. Feels like his voice is getting lower. No problems with sleeping or vivid dreams. No falls. Sense of smell and taste is ok. No dysphagia. No memory changes. No hallucinations. No falls. No drooling. Noticed slight tremor in the right hand by the end of the appointment.   Vould try taking the Sinemet every 4 hours. 8am, noon, 4pm and 8pm. In the morning his handwriting is worse but he takes the sinemet and that is fine, he doesn't need a long-acting at night. But right  Interval update 04/26/2015:He stopped the Sinemet 2 weeks ago. Says it didn't help. Explained that we may need to increase it. Discussed in  depth the plan to slowly increase the Sinemet, that this is medication for PD but we need to increase slowly. If it didn't help, dose was too low and we had plans to increase. No side effects.    Visit 3/3: DEANGELO BERNS is a 76 y.o. male here as a follow up.   Azilect not helping with the tremor and making him dizzy. Otherwise symptoms are the same.   Reviewed notes, labs and imaging from outside physicians, which showed:  MR [facility] 12/2014: This is a borderline normal MRI of the brain without contrast showing a mild extent of white matter foci that are likely due to age-related microvascular change. There is associated minimal atrophy. There are no acute findings.  Initial visit 12/27/2014: CLINTON DRAGONE is a 76 y.o. male with  no significant PMHx who is here as a consult from Dr. Jacelyn Grip for Tremors. Tremors started 8-9 months ago. Started in the right hand. He noticed it when he tried to write, penmanship was smaller and shaky. Staying about the same. Left arm is fine. Walking fine. Sense of smell ok. Has constipation. He just moved here from [place]. Lives in a town house. Plays golf, lives independently. Can't read his writing anymore. No stiffness. Father had a tremor. Balance is getting poor, if he bends down he may fall over. Vision is changing, "hazy" vision when driving at night. Worked in trucking for 33 years. He can't pick up as much as he used to. He is very forgetful. He will forget recent conversations, his wife will tell him something and a few hours later he may forget. Dad with Parkinson's Disease. Tremor is mostly at rest or when writing. Was walking the dog and the dog went quickly and he couldn't stop himself from falling down, and broke his wrist. Sometimes when he speeds up he can't stop, has to "stutter step" to turn. No neck pain. He takes daily aspirin, no history of stroke or TIA. Denies DM or heart disease. He takes no medication. He does not drink alcohol and never  smoked.   Reviewed notes, labs and imaging from outside physicians, which showed: Was sent from [facility] for evaluation of tremor. CMP wnl, LDL 98,   MRI of the brain: This is a borderline normal MRI of the brain without contrast showing a mild extent of white matter foci that are likely due to age-related microvascular change. There is associated minimal atrophy. There are no acute findings.  Review of Systems: Patient complains of symptoms per HPI as well as the following symptoms: Tremor, no chest pain, no dizziness, no shortness of breath. Pertinent negatives per HPI. All others negative.    Social History   Social History  . Marital status: Married    Spouse name: [name]   . Number of children: 2  . Years of education: 12   Occupational History  . Retired     Social History Main Topics  . Smoking status: Never Smoker  . Smokeless tobacco: Never Used  . Alcohol use No  . Drug use: No  . Sexual activity: Not on file   Other Topics Concern  . Not on file   Social History Narrative   Patient lives at home with wife [name]   Patient has 2 children    Patient has a high school education    Patient is right handed    Patient is retired    Caffeine use: 1 cup coffee.day     Family History  Problem Relation Age of Onset  . Parkinsonism Father     Past Medical History:  Diagnosis Date  . Hearing loss     Past Surgical History:  Procedure Laterality Date  . no surgical history per patient    . None      Current Outpatient Prescriptions  Medication Sig Dispense Refill  . aspirin 81 MG chewable tablet Chew 81 mg by mouth daily.    . carbidopa-levodopa (SINEMET IR) 25-100 MG tablet TAKE 1 TABLET BY MOUTH FOUR TIMES DAILY 120 tablet 11   No current facility-administered medications for this visit.     Allergies as of 08/25/2017  . (No Known Allergies)    Vitals: There were no vitals taken for this visit. Last Weight:  Wt Readings from Last 1  Encounters:  11/18/16  202 lb (91.6 kg)   Last Height:   Ht Readings from Last 1 Encounters:  11/18/16 5\' 7"  (1.702 m)   MMSE - Mini Mental State Exam 08/25/2017  Orientation to time 3  Orientation to Place 5  Registration 3  Attention/ Calculation 3  Recall 1  Language- name 2 objects 2  Language- repeat 0  Language- follow 3 step command 3  Language- read & follow direction 1  Write a sentence 1  Copy design 0  Total score 22    Cranial Nerves: decreased blink, hypomimia  The pupils are equal, round, and reactive to light. Visual fields are full to finger confrontation. Extraocular movements are intact. Trigeminal sensation is intact and the muscles of mastication are normal. The face is symmetric. The palate elevates in the midline. Reduced hearing bilat. Voice is normal. Shoulder shrug is normal. The tongue has normal motion without fasciculations.   Coordination:  Normal rapid alternating movements. 2-3 steps back with push test  Gait:  Reduced arm swing. Minimally shuffling.   Motor Observation:  Mild resting tremor with Intention and postural components that is improved today since he is on the Sinemet Tone:  cogwheeling uppers with facilitation improved  Posture:  Mildly stooped   Strength:  Strength is V/V in the upper and lower limbs.   MMSE - Mini Mental State Exam 08/25/2017  Orientation to time 3  Orientation to Place 5  Registration 3  Attention/ Calculation 3  Recall 1  Language- name 2 objects 2  Language- repeat 0  Language- follow 3 step command 3  Language- read & follow direction 1  Write a sentence 1  Copy design 0  Total score 22    Assessment/Plan: 76year old wonderful male here for evaluation of tremor, worsening penmanship, balance problems, falls, memory changes. Exam shows parkinsonian symptoms, mild shuffling and reduced arm swing, cogwheeling with facilitation, resting tremor(improved with sinemet). MRI of  the brain unremarkable. Patient has been orthostatic in the office, Advised to drink more water. We discontinued Florinef because his blood pressure became too elevated. He was here last appointment with his wife who said that patient does not adequately hydrate during the day and only drinks several cokes. Highly encouraged keeping a fluids diary and drinking 6-8 glasses of water a day or until his urine is light clear yellow.  Parkinson's disease:Continue Sinemet. 1 pills four time a day (8am, 11am, 2pm and 5pm for example). Space 3-4 hours apart. Try to separate Sinemet from food (especially protein-rich foods like meat, dairy, eggs) by about 30-60 mins - this will help the absorption of the medication. If you have some nausea with the medication, you can take it with some light food like crackers or ginger ale. Power over PD group, information given. Document when he noticed the medication wearing off.  Dizziness: Orthostatic hypotension.This can be due to hypovolemia but is also common in Parkinson's disease and with Sinemet i.e. may be neurogenic in nature. -drink more water. Keep a fluids diary. Her wife said not hydrating only drinking several cokes a day. He feels better with increased hydration - orthostatic vitals showed orthostatic vs. neurogenic hypotension.. Could be secondary to medication, dehydration and can also ne seen in PD.  - If orthostatic dizziness worsens can consider Northera. Today he feels improved, will not start now.    Snoring, daytime somnolence, ESS 12: We'll order a sleep evaluation for obstructive sleep apnea however patient's fluctuating alertness may be due to his Parkinson's disease.  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  Orders Placed This Encounter    Procedures  . Ambulatory referral to Sleep Studies  . Ambulatory referral to Neuropsychology   Sarina Ill, MD  [facility] [address] [place], [place] [address]  Phone [phone] Fax [phone]  A total of 30 minutes was spent face-to-face with this patient. Over half this time was spent on counseling patient on the Parkinson's disease, orthostatic dizziness, daytime somnolence and snoring, memory loss and diagnosis and different diagnostic and therapeutic options available.

## 2017-08-25 NOTE — Telephone Encounter (Signed)
Kyle Mcguire, this is a wonderful patiemt of mine with parkinson's disease. I would like a sleep seval. Would [name] be able to take him sooner that October 15th, might she be able to squeeze him in or pur him in an overflow spot? I'd like to get the sleep eval before he sees Dr. Si Raider for neurocognitive testing in a few months, if possible. If you call, please ask to speak with his wife to schedule appointment patient has memory problems  Thanks!

## 2017-08-25 NOTE — Patient Instructions (Signed)

## 2017-08-26 ENCOUNTER — Encounter: Payer: Self-pay | Admitting: Neurology

## 2017-08-26 DIAGNOSIS — R413 Other amnesia: Secondary | ICD-10-CM | POA: Insufficient documentation

## 2017-08-26 NOTE — Telephone Encounter (Signed)
Called and spoke with the patients wife and was able to get the patient in for a earlier apt on Oct 8th at 1:30 pm.

## 2017-08-26 NOTE — Telephone Encounter (Signed)
Thanks so much. 

## 2017-09-01 ENCOUNTER — Encounter: Payer: Self-pay | Admitting: Psychology

## 2017-09-16 DIAGNOSIS — H2513 Age-related nuclear cataract, bilateral: Secondary | ICD-10-CM | POA: Diagnosis not present

## 2017-09-21 ENCOUNTER — Ambulatory Visit (INDEPENDENT_AMBULATORY_CARE_PROVIDER_SITE_OTHER): Payer: PPO | Admitting: Neurology

## 2017-09-21 ENCOUNTER — Encounter: Payer: Self-pay | Admitting: Neurology

## 2017-09-21 VITALS — BP 109/72 | HR 69 | Ht 66.0 in | Wt 199.0 lb

## 2017-09-21 DIAGNOSIS — R0683 Snoring: Secondary | ICD-10-CM | POA: Diagnosis not present

## 2017-09-21 DIAGNOSIS — G4752 REM sleep behavior disorder: Secondary | ICD-10-CM

## 2017-09-21 DIAGNOSIS — G20 Parkinson's disease: Secondary | ICD-10-CM | POA: Diagnosis not present

## 2017-09-21 NOTE — Progress Notes (Signed)
SLEEP MEDICINE CLINIC   Provider:  Larey Mcguire, [place] D  Primary Care Physician:  Kyle Shanks, Kyle Mcguire   Referring Provider: Dr. Jaynee Eagles   Chief Complaint  Patient presents with  . New Patient (Initial Visit)    pt with wife, rm 6. pt wife states that the has dementia and he snores. so working up for sleep apnea    HPI:  Kyle Mcguire is a 76 y.o. male , seen here as in a referral  from Dr. Jaynee Eagles for a sleep evaluation, he has PD and snores so loud that his wife sleeps in a different room from him- he snored even 48 years ago when both got married.  He has been taking Flexeril which has caused him some dizziness, he also had noted memory changes that are slowly progressive over the last 3-4 years. His wife has been managing his medications and keeps the finances and check book. He has so much tremor when writing that it is easier for his wife to fill out a check. Confusion worsens throughout the day. He is also sleepy in daytime even shortly after waking up from his nighttime sleep.  Kyle Mcguire father also had a tremor but was never diagnosed as Parkinson's disease, his brother has tremors, too.   Chief complaint according to patient : "snoring but no apnea"   Sleep habits are as follows: The patient usually goes to bed between 8 and 9:00, and feels daytime sleepy. He falls asleep promptly, his bedroom is cool, quiet and dark. He sleeps alone. He sleeps usually on his side, on one pillow only. His sleep is fragmented by 3-4 bathroom breaks, his wife thinks is less. For several years he has verbalized during her sleep, sometimes his wife has heard him yelling, he may also act out dreams. This is not every night but has been occasional for over the last 30 years. In the morning he wakes up spontaneously, does not rely on an alarm and is usually awake by 7 AM. He estimates his nocturnal sleep to be between 8 and 9 hours.He reports neither dry mouth nor headaches.  Medical history and family  sleep history: Kyle Mcguire father also had a tremor but was never diagnosed as Parkinson's disease, his brother has tremors, too.    Social history: From [place].  married for 48 years, children 15 and 6, one lives close , one in [place]. The patient does not drink, not use tobacco, caffeine ; decaffeinated coffee in AM. Sodas at least 5 a day (4 liters of mountain dew daily 8 years ago ) .   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        Dr Ferdinand Lango note:  Provider: Sarina Ill, Kyle Mcguire  Referring Provider: Vernie Shanks, Kyle Mcguire Primary Care Physician:  Kyle Shanks, Kyle Mcguire CC: PD  Interval history 08/25/2017: Had falls when on Flexeril but none since then because it made him dizzy. Still having dizziness. Memory changes started slowly 3-4 years ago and slowly progressive. Started with short-term memory issues. He would forget sometimes which road to take, little things. But not progressive and wosening. Wife has been managing his medications "for a while". Confusion worsens later in the day.He used to pay the bills but now his wife pays for them. Wife makes out the checks because of the shaking. Wife found bills in the past that were not paid. He is less social. He doesn't remember their anniversary anymore, he used to all the time. He  is in a different room because of the snoring, he nods off a lot during the day. Wife is here and provides all information. He gets up and has breakfast, he broses through the paper and then he lays on the couch until 11am nodding off. He sits in the recliner all afternoon. Wife feels that he is developing dementia and has had a change in personality.has changed, he used to be very active, he used to work part time. He does take care of the laundry and cleans up after dinner. They started bowling, wife is trying to get him out more often.He is anxious and worrying a lot.He worries a lot. He has mellowed which is good. No hallucinations.   HPI: Kyle Mcguire is a wonderful  76 y.o. male here as a follow up for parkinson's disease. He is doing on Sinemet. 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 He was started on Florinef at last appointment and comes in today with his wife for the first time to discuss. Interval history 09/25/2016: Patient comes in with wife today, this the first time I'm meeting her. We reviewed the patient's history and the medications we have tried. Patient's tremor responds to Sinemet. In the past we had him on 3 pills a day and patient felt as though the Effexor good but he felt them wearing off during the day. We increased him to 4 pills. Discussed the proper way to take it, without protein as this might interfere with absorption. Also advised he can take the pills every 3-4 hours. I asked his wife to notice when she sees the tremor improved after taking medication and if she notices the medications wearing off. Also had a long discussion about hydration, patient drinks several Cokes daily but does not drink much fluid. I advised that this may be what is causing his dizziness when he stands up. Orthostatic hypotension can also be seen in Parkinson's disease or caused by Sinemet. Asked them to keep a fluids diary. The Florinef is making his blood pressure too high advised him to stop it.  Review of Systems: Out of a complete 14 system review, the patient complains of only the following symptoms, and all other reviewed systems are negative. Snoring , dream enactment.  Epworth score 13, Fatigue severity score 46  , depression score 5/15    Social History   Social History  . Marital status: Married    Spouse name: Kyle Mcguire   . Number of children: 2  . Years of education: 12   Occupational History  . Retired     Social History Main Topics  . Smoking status: Never Smoker  . Smokeless tobacco: Never Used  . Alcohol use No  . Drug use: No  . Sexual activity: Not on file   Other Topics  Concern  . Not on file   Social History Narrative   Patient lives at home with wife Kyle Mcguire   Patient has 2 children    Patient has a high school education    Patient is right handed    Patient is retired    Caffeine use: 1 cup coffee.day     Family History  Problem Relation Age of Onset  . Parkinsonism Father     Past Medical History:  Diagnosis Date  . Hearing loss     Past Surgical History:  Procedure Laterality Date  . no surgical history per patient    . None      Current Outpatient  Prescriptions  Medication Sig Dispense Refill  . aspirin 81 MG chewable tablet Chew 81 mg by mouth daily.    . Calcium Polycarbophil (FIBER-CAPS PO) Take 1 capsule by mouth daily.    . carbidopa-levodopa (SINEMET IR) 25-100 MG tablet TAKE 1 TABLET BY MOUTH FOUR TIMES DAILY 120 tablet 11   No current facility-administered medications for this visit.     Allergies as of 09/21/2017  . (No Known Allergies)    Vitals: BP 109/72   Pulse 69   Ht 5\' 6"  (1.676 m)   Wt 199 lb (90.3 kg)   BMI 32.12 kg/m  Last Weight:  Wt Readings from Last 1 Encounters:  09/21/17 199 lb (90.3 kg)   EUM:PNTI mass index is 32.12 kg/m.     Last Height:   Ht Readings from Last 1 Encounters:  09/21/17 5\' 6"  (1.676 m)    Physical exam:  General: The patient is awake, alert and appears not in acute distress. The patient is well groomed. Head: Normocephalic, atraumatic. Neck is supple. Mallampati 4 ,  neck circumference:18. 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 Retrognathia is not seen.  Cardiovascular:  Regular rate and rhythm , without  murmurs or carotid bruit, and without distended neck veins. Respiratory: Lungs are clear to auscultation. Skin:  Without evidence of edema, or rash Trunk: BMI is 32. The patient's posture is stooped.   Neurologic exam : The patient is awake and alert, oriented to place and time.   Memory subjective described as intact.  Memory testing  revealed.  MOCA:No flowsheet data found. MMSE: MMSE - Mini Mental State Exam 08/25/2017  Orientation to time 3  Orientation to Place 5  Registration 3  Attention/ Calculation 3  Recall 1  Language- name 2 objects 2  Language- repeat 0  Language- follow 3 step command 3  Language- read & follow direction 1  Write a sentence 1  Copy design 0  Total score 22    Attention span & concentration ability appears normal. Speech is fluent, without dysarthria, dysphonia or aphasia.  Mood and affect are appropriate.  Cranial nerves: Pupils are equal and briskly reactive to light. Funduscopic exam without evidence of pallor or edema. Extraocular movements  in vertical and horizontal planes intact with coarse corrective saccades. without nystagmus. Visual fields by finger perimetry are intact.Hearing to finger rub intact.  Facial sensation intact to fine touch. Facial motor strength is symmetric and tongue and uvula move midline. Shoulder shrug was symmetrical.   Motor exam: Elevated tone, normal muscle bulk and symmetric strength in all extremities. Cogwheel rigidity.  Sensory:  Fine touch, pinprick and vibration were tested in all extremities. Proprioception tested in the upper extremities was normal.Coordination: Rapid alternating movements in the fingers/hands was normal. Finger-to-nose maneuver  normal without evidence of ataxia, dysmetria or tremor.Gait and station: Patient walks without assistive device and is able unassisted to climb up to the exam table. Strength within normal limits. Stance is stable and normal.  Turns with 3 Steps. Romberg testing is negative. Deep tendon reflexes: in the  upper and lower extremities are symmetric and intact. Babinski maneuver response is  downgoing.  Assessment:  After physical and neurologic examination, review of laboratory studies,  Personal review of imaging studies, reports of other /same  Imaging studies, results of polysomnography and / or  neurophysiology testing and pre-existing records as far as provided in visit., my assessment is   1)  Dear Dr. Jaynee Eagles, Thank you very much for  referring Kyle Mcguire for a sleep study. I agree that he has several risk factors for the presence of obstructive sleep apnea as well as a history of REM behavior that may have precipitated the manifestations of Parkinson's disease by 1 or 2 decades even. I first goal is to establish if he has indeed sleep apnea, and the need an attended sleep study to see if he has periodic limb movements during REM sleep. I will set this study up as a parasomnia montage. In addition I would very much like to look for the presence of apnea and if present I would venture to want to treat it. I think that part of his daytime sleepiness may be related to the typical Parkinson medication as well as to the autonomic components office Parkinson's disorder. He seems to be overall alert, oriented and well informed of his past medical history and I will discuss with him and his wife a date when he could come by to sleep a night in the sleep lab with a goal of diagnosis and treatment. His memory concern may be improving under sleep with better oxygenation.   The patient was advised of the nature of the diagnosed disorder , the treatment options and the  risks for general health and wellness arising from not treating the condition.   I spent more than 45 minutes of face to face time with the patient.  Greater than 50% of time was spent in counseling and coordination of care. We have discussed the diagnosis and differential and I answered the patient's questions.    Plan:  Treatment plan and additional workup :  The patient will come for a REM sleep behavior study.    Kyle Seat, Kyle Mcguire 78/01/4234, 3:61 PM  Certified in Neurology by ABPN Certified in [place] by [facility] [address] [place],  [address]

## 2017-09-29 ENCOUNTER — Institutional Professional Consult (permissible substitution): Payer: Self-pay | Admitting: Neurology

## 2017-09-29 DIAGNOSIS — B3742 Candidal balanitis: Secondary | ICD-10-CM | POA: Diagnosis not present

## 2017-10-02 DIAGNOSIS — H2511 Age-related nuclear cataract, right eye: Secondary | ICD-10-CM | POA: Diagnosis not present

## 2017-10-02 DIAGNOSIS — H2513 Age-related nuclear cataract, bilateral: Secondary | ICD-10-CM | POA: Diagnosis not present

## 2017-10-29 DIAGNOSIS — H2511 Age-related nuclear cataract, right eye: Secondary | ICD-10-CM | POA: Diagnosis not present

## 2017-10-30 DIAGNOSIS — H2512 Age-related nuclear cataract, left eye: Secondary | ICD-10-CM | POA: Diagnosis not present

## 2017-11-26 DIAGNOSIS — H2512 Age-related nuclear cataract, left eye: Secondary | ICD-10-CM | POA: Diagnosis not present

## 2017-12-17 ENCOUNTER — Ambulatory Visit (INDEPENDENT_AMBULATORY_CARE_PROVIDER_SITE_OTHER): Payer: PPO | Admitting: Neurology

## 2017-12-17 DIAGNOSIS — G4752 REM sleep behavior disorder: Secondary | ICD-10-CM

## 2017-12-21 ENCOUNTER — Encounter: Payer: Self-pay | Admitting: Psychology

## 2017-12-21 ENCOUNTER — Other Ambulatory Visit: Payer: Self-pay | Admitting: Neurology

## 2017-12-21 DIAGNOSIS — I951 Orthostatic hypotension: Secondary | ICD-10-CM

## 2017-12-21 DIAGNOSIS — G20 Parkinson's disease: Secondary | ICD-10-CM

## 2017-12-21 DIAGNOSIS — R0683 Snoring: Secondary | ICD-10-CM

## 2017-12-21 DIAGNOSIS — G4752 REM sleep behavior disorder: Secondary | ICD-10-CM

## 2017-12-21 MED ORDER — MELATONIN 5 MG PO CHEW: 5.0000 mg | CHEWABLE_TABLET | Freq: Every day | ORAL | 5 refills | Status: DC

## 2017-12-21 NOTE — Procedures (Signed)
PATIENT'S NAME:  Kyle Mcguire, Kidney DOB:      [birth year]      MR#:    [id number]     DATE OF RECORDING: 12/17/2017 REFERRING M.D.:  Sarina Ill, MD and Yaakov Guthrie, MD Study Performed:   Baseline Polysomnogram HISTORY:  Kyle Mcguire is a 77 y.o. male , seen here after referral from Dr. Jaynee Eagles for a sleep evaluation. He has PD and snores so loud that his wife sleeps in a different room from him- but he snored already 48 years ago when both got married.  He has been taking Flexeril which has caused him some dizziness, he also had noted memory changes that are slowly progressive over the last 3-4 years. The patient has been acting out dreams.   His wife has been managing his medications and keeps the finances and check book after he couldn't do it any longer. He has much tremor when writing. Confusion worsens throughout the day. He is also sleepy in daytime even shortly after waking up from his nighttime sleep.   The patient endorsed the Epworth Sleepiness Scale at 13/24 points. FSS at 46 points- elevated.   The patient's weight 199 pounds with a height of 66 (inches), resulting in a BMI of 31.9 kg/m2. The patient's neck circumference measured 18 inches.  CURRENT MEDICATIONS: Aspirin, Fiber caps, Sinemet IR   PROCEDURE:  This is a multichannel digital polysomnogram utilizing the Somnostar 11.2 system.  Electrodes and sensors were applied and monitored per AASM Specifications.   EEG, EOG, Chin and Limb EMG, were sampled at 200 Hz.  ECG, Snore and Nasal Pressure, Thermal Airflow, Respiratory Effort, CPAP Flow and Pressure, Oximetry was sampled at 50 Hz. Digital video and audio were recorded.      BASELINE STUDY Lights Out was at 20:59 and Lights On at 05:04.  Total recording time (TRT) was 485.5 minutes, with a total sleep time (TST) of 447.5 minutes. The patient's sleep latency was brief at 8.5 minutes.  REM latency was 134 minutes.  The sleep efficiency was 92.2 %.     SLEEP ARCHITECTURE: WASO  (Wake after sleep onset) was 29 minutes.  There were 16.5 minutes in Stage N1, 363 minutes Stage N2, 1 minutes Stage N3 and 67 minutes in Stage REM.  The percentage of Stage N1 was 3.7%, Stage N2 was 81.1%, Stage N3 was .2% and Stage R (REM sleep) was 15.%.   RESPIRATORY ANALYSIS:  There were a total of 58 respiratory events:  10 obstructive apneas, 0 central apneas and 0 mixed apneas with a total of 10 apneas and an apnea index (AI) of 1.3 /hour. There were 48 hypopneas with a hypopnea index of 6.4 /hour. The patient also had 0 respiratory event related arousals (RERAs).    The total APNEA/HYPOPNEA INDEX (AHI) was 7.8/hour and the total RESPIRATORY DISTURBANCE INDEX was 7.8 /hour.  9 events occurred in REM sleep and 78 events in NREM. The REM AHI was 8.1 /hour, versus a non-REM AHI of 7.7/hr. The patient spent 417 minutes of total sleep time in the supine position and only 31 minutes in non-supine. The supine AHI was 7.5/hr. versus a non-supine AHI of 11.8.  OXYGEN SATURATION & C02:  The Wake baseline 02 saturation was 95%, with the lowest being 83%. Time spent below 89% saturation equaled 9 minutes.   PERIODIC LIMB MOVEMENTS:  The patient had a total of 219 Periodic Limb Movements.  The Periodic Limb Movement (PLM) index was 29.4 and the PLM Arousal  index was 1.7/hour. The arousals were noted as: 39 were spontaneous, 13 were associated with PLMs, and 4 were associated with respiratory events.  Audio and video analysis did not show any abnormal or unusual movements, behaviors, phonations or vocalizations.  There were frequent PLMs that were present even in REM sleep- indication of REM BD.   No nocturia/ bathroom breaks. Oxygen desaturations worse in supine sleep, snoring louder in supine. EKG was in keeping with normal sinus rhythm (NSR).  The patient indicated post-study that his sleep was the same as usual, described as long, uninterrupted and sound.   IMPRESSION:  1. 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  2. Clinically insignificant Snoring. Mild Obstructive Sleep Apnea (OSA), not in need of intervention.   3. Confusional Arousals.     RECOMMENDATIONS:  1. 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  2. I recommend 5 mg or less of Melatonin at bedtime.   3. A follow up appointment will be scheduled in the Sleep Clinic at [facility]. The referring provider will be notified of the results.      I certify that I have reviewed the entire raw data recording prior to the issuance of this report in accordance with the Standards of Accreditation of the American Academy of Sleep Medicine (AASM)    Larey Seat, MD     12-21-2017  Diplomat, American Board of Psychiatry and Neurology  Diplomat, American Board of [place] Director, [facility] [employer]

## 2017-12-22 ENCOUNTER — Telehealth: Payer: Self-pay | Admitting: Neurology

## 2017-12-22 NOTE — Telephone Encounter (Signed)
Went over the sleep results with the patients wife. I informed them of the REM BD and that Dr Dohmeier recommends the patient start a melatonin at bedtime of 5mg  or less to help with treating that. At which point if there is no relief they can call and set up apt to discuss treatment options. I went over that the apnea was mild and would not require CPAP as therapy. Pt wife verbalized understanding.

## 2017-12-22 NOTE — Telephone Encounter (Signed)
Patients wife is returning your call

## 2017-12-22 NOTE — Telephone Encounter (Signed)
-----   Message from Larey Seat, MD sent at 12/21/2017  4:54 PM EST ----- Evidence of REM behavior disorder= PLMs during REM sleep were noted. Insignificant apnea but snoring recorded.  Snoring and mild apnea can be treated with CPAP, but I consider this optional, as AHI was less than 8/hr.  I recommend using Melatonin at 5mg  nightly and if REM BD doesn't improve, change to Klonopin . D/c all tricyclic and antihistamin medication as they have anticholinergic qualities. CD  Cc Dr Jacelyn Grip.

## 2017-12-22 NOTE — Telephone Encounter (Signed)
Called patient to discuss sleep study results. No answer at this time. LVM for the patient to call back.   

## 2017-12-24 ENCOUNTER — Ambulatory Visit (INDEPENDENT_AMBULATORY_CARE_PROVIDER_SITE_OTHER): Payer: PPO | Admitting: Psychology

## 2017-12-24 ENCOUNTER — Telehealth: Payer: Self-pay | Admitting: Psychology

## 2017-12-24 ENCOUNTER — Encounter: Payer: Self-pay | Admitting: Psychology

## 2017-12-24 DIAGNOSIS — R413 Other amnesia: Secondary | ICD-10-CM

## 2017-12-24 DIAGNOSIS — G20 Parkinson's disease: Secondary | ICD-10-CM

## 2017-12-24 NOTE — Progress Notes (Signed)
NEUROBEHAVIORAL STATUS EXAM (CPT: [phone])  Name: Kyle Mcguire Date of Birth: March 23, 1941 Date of Interview: 12/24/2017  Reason for Referral:  Kyle Mcguire is a 77 y.o. male who is referred for neuropsychological evaluation by Dr. Sarina Ill of [facility] due to concerns about possible Parkinson's disease dementia. This patient is accompanied in the office by his wife who supplements the history.  History of Presenting Problem:  Mr. Kyle Mcguire has been followed by Dr. Jaynee Eagles since January 2016 for Parkinson's disease (initial symptom was unilateral UE tremor in early 2015). His father and younger brother also had/have tremor. The patient is managed with Sinemet. He also has been diagnosed with REM sleep behavior disorder (sleep study completed a week ago on 12/17/2017). MRI brain completed on 01/04/2015 showed no acute findings, mild microvascular change in white matter, minimal atrophy. There is no family history of dementia or Alzheimer's disease that he knows of. The patient was most recently seen by Dr. Jaynee Eagles on 08/25/2017; MMSE was 22/30. PDD was suspected.   The patient and his wife endorse concerns about short term memory and cognitive decline. His wife reports gradual onset around 5-6 years ago, mild in the beginning but has worsened over time and now is noted daily. The patient and his wife endorsed forgetfulness for recent conversations/events, difficulty concentrating, distractibility, slowed information processing, word finding difficulty, and some uncertainly about directions when driving (they moved here from [place] in 2015). He has not had any MVAs or minor accidents but does admit to reduced judgment of spatial distances. His wife manages his medications. She also took over the finances/bill payment and she manages his appointments. He does help with some housekeeping activities. He is able to perform all basic ADLs independently including bathing and dressing.   His  mood has not been depressed and is actually more patient and less irritable than in the past. He demonstrates reduced initiation/motivation. He also has been experiencing increased anxiety characterized by excessive worrying, which was never present in the past. Psychiatric history was denied.  He has not had any visual illusions or hallucinations. He has not had any recent falls. He does complain of dizziness. He reports he feels rested upon awakening but nods off easily throughout the day. Of note, he is sitting most of the day and is engaged in very little activity. His wife wants him to be more active and has tried to get him to go to one of the PD exercise programs but "he always has an excuse". He states he has no desire to do that and is perfectly content to sit at home. He does take a walk "every now and then".   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 Alcohol: None Tobacco: None   Medical History: Past Medical History:  Diagnosis Date  . Hearing loss   Parkinson's disease REM sleep behavior disorder Memory loss   Current Medications:  Outpatient Encounter Medications as of 12/24/2017  Medication Sig  . aspirin 81 MG chewable tablet Chew 81 mg by mouth daily.  . Calcium Polycarbophil (FIBER-CAPS PO) Take 1 capsule by mouth daily.  . carbidopa-levodopa (SINEMET IR) 25-100 MG tablet TAKE 1 TABLET BY MOUTH FOUR TIMES DAILY  . Melatonin 5 MG CHEW Chew 5 mg by mouth at bedtime.  No facility-administered encounter medications on file as of 12/24/2017.      Behavioral Observations:   Appearance: Neatly, casually and appropriately dressed and groomed Gait: Ambulated independently, mildly stiff/shuffling gait Speech: Sparse but fluent; Mildly reduced volume.    Thought process: Appears linear Affect: Blunted but not masked. Does smile appropriately several times. Does not appear dysphoric. Interpersonal: Pleasant, appropriate   45 minutes spent face-to-face with patient completing neurobehavioral status exam. 30 minutes spent integrating medical records/clinical data and completing this report. [CPT code 02111]   TESTING: There is medical necessity to proceed with neuropsychological assessment as the results will be used to aid in differential diagnosis and clinical decision-making and to inform specific treatment recommendations. Per the patient, his wife and medical records reviewed, there has been a change in cognitive functioning and a reasonable suspicion of dementia due to PD or perhaps other etiology given time of onset.  Clinical Decision Making: In considering the patient's current level of functioning, level of presumed impairment, nature of symptoms, emotional and behavioral responses during the interview, level of literacy, and observed level of motivation, a battery of tests was selected and communicated to the psychometrician.   Following the clinical interview/neurobehavioral status exam, the patient completed this full battery of neuropsychological testing with my psychometrician under my supervision (see separate note).   PLAN: The patient will return to see me for a follow-up session at which time his test performances and my impressions and treatment recommendations will be reviewed in detail.  Evaluation ongoing; full report to follow.

## 2017-12-24 NOTE — Progress Notes (Signed)
   Neuropsychology Note  [name] completed 60 minutes of neuropsychological testing with technician, Milana Kidney, BS, under the supervision of Dr. Macarthur Critchley, Licensed Psychologist. The patient did not appear overtly distressed by the testing session, per behavioral observation or via self-report to the technician. Rest breaks were offered.   Clinical Decision Making: In considering the patient's current level of functioning, level of presumed impairment, nature of symptoms, emotional and behavioral responses during the interview, level of literacy, and observed level of motivation/effort, a battery of tests was selected and communicated to the psychometrician.  Communication between the psychologist and technician was ongoing throughout the testing session and changes were made as deemed necessary based on patient performance on testing, technician observations and additional pertinent factors such as those listed above.  [name] will return within approximately 2 weeks for an interactive feedback session with Dr. Si Raider at which time his test performances, clinical impressions and treatment recommendations will be reviewed in detail. The patient understands he can contact our office should he require our assistance before this time.  35 minutes spent performing neuropsychological evaluation services/clinical decision making (psychologist). [CPT 40981] 60 minutes spent face-to-face with patient administering standardized tests, 30 minutes spent scoring (technician). [CPT Y8200648, [id number] units]  Full report to follow.

## 2017-12-24 NOTE — Telephone Encounter (Signed)
Patient's wife called regarding his appointment today. They were told they would get some information regarding a Boxing Program. Please Advise. Thanks

## 2017-12-24 NOTE — Telephone Encounter (Signed)
I do have a packet of information for them -- I can give it to them at his follow-up on 1/24, or if she would like it sooner I can put it in the mail to her.

## 2017-12-25 NOTE — Telephone Encounter (Signed)
Kyle Mcguire! I guess we could mail it out to them. Thanks

## 2017-12-28 DIAGNOSIS — H6122 Impacted cerumen, left ear: Secondary | ICD-10-CM | POA: Diagnosis not present

## 2017-12-28 DIAGNOSIS — H9202 Otalgia, left ear: Secondary | ICD-10-CM | POA: Diagnosis not present

## 2017-12-29 NOTE — Telephone Encounter (Signed)
Information mailed to the patient/wife at his home address.

## 2018-01-06 NOTE — Progress Notes (Signed)
NEUROPSYCHOLOGICAL EVALUATION   Name:    Kyle Mcguire  Date of Birth:   Jul 19, 1941 Date of Interview:  12/24/2017 Date of Testing:  12/24/2017   Date of Feedback:  01/07/2018       Background Information:  Reason for Referral:  Kyle Mcguire is a 77 y.o. male referred by Dr. Sarina Ill to assess his current level of cognitive functioning and assist in differential diagnosis. The current evaluation consisted of a review of available medical records, an interview with the patient and his wife, and the completion of a neuropsychological testing battery. Informed consent was obtained.  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 His father and younger brother also had/have tremor. The patient is managed with Sinemet. He also has been diagnosed with REM sleep behavior disorder (sleep study completed on 12/17/2017). MRI brain completed on 01/04/2015 showed no acute findings, mild microvascular change in white matter, minimal atrophy. There is no family history of dementia or Alzheimer's disease that he knows of. The patient was most recently seen by Dr. Jaynee Eagles on 08/25/2017; MMSE was 22/30. PDD was suspected.   The patient and his wife endorse concerns about short term memory and cognitive decline. His wife reports gradual onset around 5-6 years ago, mild in the beginning but has worsened over time and now is noted daily. The patient and his wife endorsed forgetfulness for recent conversations/events, difficulty concentrating, distractibility, slowed information processing, word finding difficulty, and some uncertainly about directions when driving (they moved here from [place] in 2015). He has not had any MVAs or minor accidents but does admit to reduced judgment of spatial distances. His wife manages his medications. She also took over the finances/bill payment and she  manages his appointments. He does help with some housekeeping activities. He is able to perform all basic ADLs independently including bathing and dressing.   His mood has not been depressed and is actually more patient and less irritable than in the past. He demonstrates reduced initiation/motivation. He also has been experiencing increased anxiety characterized by excessive worrying, which was never present in the past. Psychiatric history was denied.  He has not had any visual illusions or hallucinations. He has not had any recent falls. He does complain of dizziness. He reports he feels rested upon awakening but nods off easily throughout the day. Of note, he is sitting most of the day and is engaged in very little activity. His wife wants him to be more active and has tried to get him to go to one of the PD exercise programs but "he always has an excuse". He states he has no desire to do that and is perfectly content to sit at home. He does take a walk "every now and then".   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 Alcohol: None Tobacco: None   Medical History:  Past Medical History:  Diagnosis Date  . Hearing loss     Current medications:  Outpatient Encounter Medications as of 01/07/2018  Medication Sig  . aspirin 81 MG chewable tablet Chew 81 mg by mouth daily.  . Calcium Polycarbophil (FIBER-CAPS PO) Take 1 capsule by mouth daily.  [name]  carbidopa-levodopa (SINEMET IR) 25-100 MG tablet TAKE 1 TABLET BY MOUTH FOUR TIMES DAILY  . Melatonin 5 MG CHEW Chew 5 mg by mouth at bedtime.   No facility-administered encounter medications on file as of 01/07/2018.      Current Examination:  Behavioral Observations:  Appearance: Neatly, casually and appropriately dressed  and groomed Gait: Ambulated independently, mildly stiff/shuffling gait Speech: Sparse but fluent; Mildly reduced volume.  Thought process: Appears linear Affect: Blunted but not masked. Does smile appropriately several times. Does not appear dysphoric. Interpersonal: Pleasant, appropriate Orientation: Oriented to all spheres, except he was one year off on his current age. Accurately named the current President and his predecessor.   Tests Administered: . Test of Premorbid Functioning (TOPF) . Wechsler Adult Intelligence Scale-Fourth Edition (WAIS-IV): Similarities, Block Design,  and Digit Span subtests . Wechsler Memory Scale-Fourth Edition (WMS-IV) Older Adult Version (ages 7-90): Logical Memory I, II and Recognition subtests  . [employer] Verbal Learning Test - 2nd Edition (CVLT-2) Short Form . Repeatable Battery for the Assessment of Neuropsychological Status (RBANS) Form A:  Figure Copy and Recall subtests and Semantic Fluency subtest . Neuropsychological Assessment Battery (NAB) Language Module, Form 1: Naming subtest . Boston Diagnostic Aphasia Examination: Complex Ideational Material subtest . Controlled Oral Word Association Test (COWAT) . Trail Making Test A and B . Clock drawing test . Symbol Digit Modalities Test (SDMT) . Geriatric Depression Scale (GDS) 15 Item . Generalized Anxiety Disorder - 7 item screener (GAD-7) . Parkinson's Disease Questionnaire (PDQ-39)  Test Results: Note: Standardized scores are presented only for use by appropriately trained professionals and to allow for any future test-retest comparison. These scores should not be interpreted without consideration of all the information that is contained in the rest of the report. The most recent standardization samples from the test publisher or other sources were used whenever possible to derive standard scores; scores were corrected for age, gender, ethnicity and education when available.   Test  Scores:  Test Name Raw Score Standardized Score Descriptor  TOPF 35/70 SS= 97 Average  WAIS-IV Subtests     Similarities 7/36 ss= 3 Impaired  Block Design 24/66 ss= 9 Average  Digit Span Forward 8/16 ss= 8 Low end of average  Digit Span Backward 6/16 ss= 8 Low end of average  WMS-IV Subtests     LM I 9/53 ss= 3 Impaired  LM II 1/39 ss= 3 Impaired  LM II Recognition 16/23 Cum %: 26-50   RBANS Subtests     Figure Copy 17/20 Z= -0.4 Average  Figure Recall 9/20 Z= -0.8 Low average  Semantic Fluency 5 Z= -2.9 Severely impaired  CVLT-II Scores     Trial 1 0/9 Z= -4 Severely impaired  Trial 4 2/9 Z= -3 Severely impaired  Trials 1-4 total 6/36 T= 14 Severely impaired  SD Free Recall 3/9 Z= -2 Impaired  LD Free Recall 3/9 Z= -1 Low average  LD Cued Recall 3/9 Z= -1.5 Borderline  Recognition Discriminability 7/9 hits, 2 false positives Z= -0.5 Average  Forced Choice Recognition 9/9  WNL  NAB Language subtest     Naming 16/31 T= 19 Severely impaired  BDAE Subtest     Complex Ideational Material 8/12  Impaired  COWAT-FAS 9 T= 29 Impaired  COWAT-Animals 4 T= 21 Severely impaired  Trail Making Test A  57" 0 errors T= 45 Average  Trail Making Test B  Pt unable     Clock Drawing   Impaired  SDMT  Written 12/110 Z= -2.4 Impaired  Oral 19/110 Z= -2 Impaired  GDS-15 4/15  WNL  GAD-7 0/21  WNL  PDQ-39     Mobility 22.5%    Activities of Daily Living 8.33%    Emotional Well Being 20.8%    Stigma 6.25%    Social Support 8.33%    Cognitive Impairment 25%    Communication 25%    Bodily Discomfort 25%        Description of Test Results:  Premorbid verbal intellectual abilities were estimated to have been within the average range based on a test of word reading. Psychomotor processing speed was average on a simple line-connecting task but impaired on a more complex coding task, and remained in the impaired range even when the motor component was removed. Auditory attention and  working memory were average. Visual-spatial construction was average. Language abilities were significantly below expectation. Specifically, confrontation naming was severely impaired, and semantic verbal fluency was severely impaired. Auditory comprehension of complex ideational material was impaired. With regard to verbal memory, encoding and acquisition of non-contextual information (i.e., word list) was severely impaired. After a brief distracter task, free recall was impaired (3/9 items). After a delay, free recall was low average (3/9 items). Cued recall was borderline (3/9 items). Performance on a yes/no recognition task was low average. On another verbal memory test, encoding and acquisition of contextual auditory information (i.e., short stories) was impaired. After a delay, free recall was impaired. Performance on a yes/no recognition task was below expectation. With regard to non-verbal memory, delayed free recall of visual information was low average. Executive functioning was impaired. Mental flexibility and set-shifting were impaired; he was unable to complete Trails B. Verbal fluency with phonemic search restrictions was impaired. Verbal abstract reasoning was impaired. Performance on a clock drawing task was impaired. On self-report measures of mood, the patient's responses were not indicative of clinically significant depression or anxiety at the present time. On a self-report measure assessing for the impact of PD on the patient's daily functioning and quality of life, the patient endorsed the most difficulty with cognitive impairment, communication, bodily discomfort and mobility (all to a mild degree). He denied any significant problems with emotional wellbeing, ADLs, stigma, or social support.  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 Results of cognitive evaluation revealed significant deficits in several aspects of cognitive function. Additionally, there is evidence  that cognitive deficits are beginning to interfere with his ability to manage complex tasks such as managing appointments, medications and finances. As such, diagnostic criteria for a dementia syndrome are met. Based on test results and current level of functioning, I would characterize his dementia as mild stage. Cognitive profile is mostly in line with subcortical involvement. Specifically, processing speed is slow (even without motor component) and there is prominent executive dysfunction. Memory is impaired at the level of encoding/retrieval, which is more consistent with frontal-subcortical network dysfunction than cortical involvement. Semantic retrieval, verbal fluency and auditory comprehension/working memory are also impaired. Interestingly, an area of relative strength isvisual-spatial skills (often but not always impaired in PDD). Visual memory is better than auditory memory.  At this point in time, there is no convincing evidence that there is superimposed AD or other neurodegenerative process.  Taken together, the patient's cognitive profile, clinical features and medical history suggest that PD is the most likely etiology of the patient's mild dementia. Fortunately, there is no evidence of significant depression, but he does seem to be experiencing some generalized anxiety which is  not longstanding and likely more related to neurobiological changes associated with PD.   Recommendations/Plan: Based on the findings of the present evaluation, the following recommendations are offered:  1. The patient and his wife will benefit from education and support regarding his diagnosis. I provided a plethora of information from the PDF and also answered their questions to the best of my ability.  2. His generalized anxiety does not appear to be clinically significant at this time, but if it becomes problematic in the future, psychopharmacological treatment could be considered.  3. I believe he would  benefit significantly from regular engagement in physical exercise such as the [employer] program, as this would also give him social interaction and camaraderie with other individuals with PD. I provided information on this as well as other PD exercise and support programs. 4. I discussed with the patient and his wife that they will need to monitor driving abilities, given his slow processing speed and executive dysfunction on testing. If there is any concern about his driving, an on road evaluation could be helpful. 5. His wife should continue to assist with management of finances, medications and appointments. I spoke with them about alarm pillboxes that may be helpful for managing his four-times-daily medications. 6. For behavioral activation, he should have a regular calendar/schedule of activities and daily tasks.    Feedback to Patient: Kyle Mcguire and his wife returned for a feedback appointment on 01/07/2018 to review the results of his neuropsychological evaluation with this provider. 40 minutes face-to-face time was spent reviewing his test results, my impressions and my recommendations as detailed above.    Total time spent on this patient's case: [phone] for neurobehavioral status exam with psychologist; [phone] and [phone] units of testing/scoring by psychometrician under psychologist's supervision; [phone] and [phone] units for integration of patient data, interpretation of standardized test results and clinical data, clinical decision making, treatment planning and preparation of this report, and interactive feedback with review of results to the patient/family by psychologist.      Thank you for your referral of Kyle Mcguire. Please feel free to contact me if you have any questions or concerns regarding this report.

## 2018-01-07 ENCOUNTER — Ambulatory Visit: Payer: PPO | Admitting: Psychology

## 2018-01-07 ENCOUNTER — Encounter: Payer: Self-pay | Admitting: Psychology

## 2018-01-07 DIAGNOSIS — F0280 Dementia in other diseases classified elsewhere without behavioral disturbance: Secondary | ICD-10-CM

## 2018-01-07 DIAGNOSIS — G20 Parkinson's disease: Principal | ICD-10-CM

## 2018-01-26 DIAGNOSIS — B356 Tinea cruris: Secondary | ICD-10-CM | POA: Diagnosis not present

## 2018-01-26 DIAGNOSIS — R5383 Other fatigue: Secondary | ICD-10-CM | POA: Diagnosis not present

## 2018-01-26 DIAGNOSIS — L570 Actinic keratosis: Secondary | ICD-10-CM | POA: Diagnosis not present

## 2018-01-26 DIAGNOSIS — Z125 Encounter for screening for malignant neoplasm of prostate: Secondary | ICD-10-CM | POA: Diagnosis not present

## 2018-01-26 DIAGNOSIS — Z0000 Encounter for general adult medical examination without abnormal findings: Secondary | ICD-10-CM | POA: Diagnosis not present

## 2018-01-26 DIAGNOSIS — L821 Other seborrheic keratosis: Secondary | ICD-10-CM | POA: Diagnosis not present

## 2018-01-26 DIAGNOSIS — Z13220 Encounter for screening for lipoid disorders: Secondary | ICD-10-CM | POA: Diagnosis not present

## 2018-01-26 DIAGNOSIS — G20 Parkinson's disease: Secondary | ICD-10-CM | POA: Diagnosis not present

## 2018-01-26 DIAGNOSIS — H9193 Unspecified hearing loss, bilateral: Secondary | ICD-10-CM | POA: Diagnosis not present

## 2018-02-12 DIAGNOSIS — B078 Other viral warts: Secondary | ICD-10-CM | POA: Diagnosis not present

## 2018-02-12 DIAGNOSIS — X32XXXA Exposure to sunlight, initial encounter: Secondary | ICD-10-CM | POA: Diagnosis not present

## 2018-02-12 DIAGNOSIS — L570 Actinic keratosis: Secondary | ICD-10-CM | POA: Diagnosis not present

## 2018-02-12 DIAGNOSIS — L821 Other seborrheic keratosis: Secondary | ICD-10-CM | POA: Diagnosis not present

## 2018-03-05 ENCOUNTER — Other Ambulatory Visit: Payer: Self-pay | Admitting: Neurology

## 2018-07-28 DIAGNOSIS — M79641 Pain in right hand: Secondary | ICD-10-CM | POA: Diagnosis not present

## 2018-07-28 DIAGNOSIS — S60221A Contusion of right hand, initial encounter: Secondary | ICD-10-CM | POA: Diagnosis not present

## 2018-09-13 ENCOUNTER — Encounter: Payer: Self-pay | Admitting: Neurology

## 2018-09-13 ENCOUNTER — Ambulatory Visit (INDEPENDENT_AMBULATORY_CARE_PROVIDER_SITE_OTHER): Payer: PPO | Admitting: Neurology

## 2018-09-13 ENCOUNTER — Encounter

## 2018-09-13 VITALS — BP 118/74 | HR 58 | Ht 66.0 in | Wt 196.0 lb

## 2018-09-13 DIAGNOSIS — F0280 Dementia in other diseases classified elsewhere without behavioral disturbance: Secondary | ICD-10-CM | POA: Diagnosis not present

## 2018-09-13 DIAGNOSIS — G20 Parkinson's disease: Secondary | ICD-10-CM | POA: Diagnosis not present

## 2018-09-13 MED ORDER — DONEPEZIL HCL 10 MG PO TABS: ORAL_TABLET | ORAL | 6 refills | Status: DC

## 2018-09-13 MED ORDER — CARBIDOPA-LEVODOPA 25-100 MG PO TABS: 1.0000 | ORAL_TABLET | Freq: Four times a day (QID) | ORAL | 11 refills | Status: DC

## 2018-09-13 NOTE — Patient Instructions (Signed)
Start Donepezil for memory Continue other medications Will send to [facility], [facility] at [employer] neurolog[facility]   Donepezil tablets What is this medicine? DONEPEZIL (doe NEP e zil) is used to treat mild to moderate dementia caused by Alzheimer's disease and other dementias. This medicine may be used for other purposes; ask your health care provider or pharmacist if you have questions. COMMON BRAND NAME(S): Aricept What should I tell my health care provider before I take this medicine? They need to know if you have any of these conditions: -asthma or other lung disease -difficulty passing urine -head injury -heart disease -history of irregular heartbeat -liver disease -seizures (convulsions) -stomach or intestinal disease, ulcers or stomach bleeding -an unusual or allergic reaction to donepezil, other medicines, foods, dyes, or preservatives -pregnant or trying to get pregnant -breast-feeding How should I use this medicine? Take this medicine by mouth with a glass of water. Follow the directions on the prescription label. You may take this medicine with or without food. Take this medicine at regular intervals. This medicine is usually taken before bedtime. Do not take it more often than directed. Continue to take your medicine even if you feel better. Do not stop taking except on your doctor's advice. If you are taking the 23 mg donepezil tablet, swallow it whole; do not cut, crush, or chew it. Talk to your pediatrician regarding the use of this medicine in children. Special care may be needed. Overdosage: If you think you have taken too much of this medicine contact a poison control center or emergency room at once. NOTE: This medicine is only for you. Do not share this medicine with others. What if I miss a dose? If you miss a dose, take it as soon as you can. If it is almost time for your next dose, take only that dose, do not take double or extra doses. What may interact with this  medicine? Do not take this medicine with any of the following medications: -certain medicines for fungal infections like itraconazole, fluconazole, posaconazole, and voriconazole -cisapride -dextromethorphan; quinidine -dofetilide -dronedarone -pimozide -quinidine -thioridazine -ziprasidone This medicine may also interact with the following medications: -antihistamines for allergy, cough and cold -atropine -bethanechol -carbamazepine -certain medicines for bladder problems like oxybutynin, tolterodine -certain medicines for Parkinson's disease like benztropine, trihexyphenidyl -certain medicines for stomach problems like dicyclomine, hyoscyamine -certain medicines for travel sickness like scopolamine -dexamethasone -ipratropium -NSAIDs, medicines for pain and inflammation, like ibuprofen or naproxen -other medicines for Alzheimer's disease -other medicines that prolong the QT interval (cause an abnormal heart rhythm) -phenobarbital -phenytoin -rifampin, rifabutin or rifapentine This list may not describe all possible interactions. Give your health care provider a list of all the medicines, herbs, non-prescription drugs, or dietary supplements you use. Also tell them if you smoke, drink alcohol, or use illegal drugs. Some items may interact with your medicine. What should I watch for while using this medicine? Visit your doctor or health care professional for regular checks on your progress. Check with your doctor or health care professional if your symptoms do not get better or if they get worse. You may get drowsy or dizzy. Do not drive, use machinery, or do anything that needs mental alertness until you know how this drug affects you. What side effects may I notice from receiving this medicine? Side effects that you should report to your doctor or health care professional as soon as possible: -allergic reactions like skin rash, itching or hives, swelling of the face, lips, or  tongue -feeling  faint or lightheaded, falls -loss of bladder control -seizures -signs and symptoms of a dangerous change in heartbeat or heart rhythm like chest pain; dizziness; fast or irregular heartbeat; palpitations; feeling faint or lightheaded, falls; breathing problems -signs and symptoms of infection like fever or chills; cough; sore throat; pain or trouble passing urine -signs and symptoms of liver injury like dark yellow or brown urine; general ill feeling or flu-like symptoms; light-colored stools; loss of appetite; nausea; right upper belly pain; unusually weak or tired; yellowing of the eyes or skin -slow heartbeat or palpitations -unusual bleeding or bruising -vomiting Side effects that usually do not require medical attention (report to your doctor or health care professional if they continue or are bothersome): -diarrhea, especially when starting treatment -headache -loss of appetite -muscle cramps -nausea -stomach upset This list may not describe all possible side effects. Call your doctor for medical advice about side effects. You may report side effects to FDA at 1-800-FDA-1088. Where should I keep my medicine? Keep out of reach of children. Store at room temperature between 15 and 30 degrees C (59 and 86 degrees F). Throw away any unused medicine after the expiration date. NOTE: This sheet is a summary. It may not cover all possible information. If you have questions about this medicine, talk to your doctor, pharmacist, or health care provider.  2018 Elsevier/Gold Standard (2016-05-19 21:00:42)

## 2018-09-13 NOTE — Progress Notes (Signed)
[id number]    Provider:  Dr Jaynee Eagles Referring Provider: Vernie Shanks, MD Primary Care Physician:  Vernie Shanks, MD CC: PD  Interval history 09/13/2018: Lovely gentleman here with his wife for follow up of PD.  He was seen by Dr. Marcos Eke who provided much information on his condition and also recommendations such as activities and exercise. 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 Will request they meet with the social worker at [facility]. He goes ro [employer] 2x a week which is new and excellent.  He has not been dizzy lately, discussed hydration. No recent falls - 5-6 months ago he fell but none since . No swallowing difficulties.  No difficulties with the Sinemet. Tremors are stable, not bothersome. He is stable, does not want to change any management, will keep om the same dose of Sinemet. He is drinking more sprakling water and no pepsi and he likes it. No problems swallowing. Starting to have back problems. He is inactive most days except the boxing now 2x a week. He was dxed with REM sleep disorder but it doesn't affect him. They bowl every Monday.   Interval history 08/25/2017: Had falls when on Flexeril but none since then because it made him dizzy. Still having dizziness. Memory changes started slowly 3-4 years ago and slowly progressive. Started with short-term memory issues. He would forget sometimes which road to take, little things. But not progressive and wosening. Wife has been managing his medications "for a while". Confusion worsens later in the day.He used to pay the bills but now his wife pays for them. Wife makes out the checks because of the shaking. Wife found bills in the past that were not paid. He is less social. He doesn't remember their anniversary anymore, he used to all the time. He is in a different room because of the snoring, he nods off a lot during the day. Wife is here and provides all information. He gets up and has breakfast, he broses  through the paper and then he lays on the couch until 11am nodding off. He sits in the recliner all afternoon. Wife feels that he is developing dementia and has had a change in personality.has changed, he used to be very active, he used to work part time. He does take care of the laundry and cleans up after dinner. They started bowling, wife is trying to get him out more often.He is anxious and worrying a lot.He worries a lot. He has mellowed which is good. No hallucinations.     Interval history: He feels like the dizziness has improved. Discussed northera. He feels that the dizziness is not too significant. We tried Florinef but his bllod pressure was too elevated. He last took the Sinemet at 7 this morning and he has no tremor, then he takes it at noon and then at 7pm and he goes to sleep at 10pm. He feels under control. No risk of fallin gfrom the dizziness, he has been drinking more water and feels better and less dizzy. His blood pressures from the last month have ranged 120-143/ 77-89 which is excellent. Hydrating more. One appointment was 101/82.  3-4 glasses of water a day and lemonade as well. No swallowing issues. No falls.   HPI: Kyle Mcguire is a wonderful 77 y.o. male here as a follow up for parkinson's disease. He is doing on Sinemet. 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 He was started on Florinef at last  appointment and comes in today with his wife for the first time to discuss.  Interval history 09/25/2016: Patient comes in with wife today, this the first time I'm meeting her. We reviewed the patient's history and the medications we have tried. Patient's tremor responds to Sinemet. In the past we had him on 3 pills a day and patient felt as though the Effexor good but he felt them wearing off during the day. We increased him to 4 pills. Discussed the proper way to take it, without protein as this might interfere with absorption.  Also advised he can take the pills every 3-4 hours. I asked his wife to notice when she sees the tremor improved after taking medication and if she notices the medications wearing off. Also had a long discussion about hydration, patient drinks several Cokes daily but does not drink much fluid. I advised that this may be what is causing his dizziness when he stands up. Orthostatic hypotension can also be seen in Parkinson's disease or caused by Sinemet. Asked them to keep a fluids diary. The Florinef is making his blood pressure too high advised him to stop it.  Interval history 08/2016: Tremor is good. He is dizzy all the time. Things are blurry when he looks at them. He feels dizzy even now when sitting. His orthostatics have been normal. He takes the carbidopa/levodopa 4 x a day. He last took the medication at 8am this morning and his tremor is fine. Will go back to 3 pills of Sinemet a day and see if this improved his dizziness. if needed, florinef 0.1, 1/2 pill up to bid for starters  Interval history 01/2016:He is on 1 tab 4 times a day every 4 hours. His tremor is better. He takes the medication around 8am he feels the tremor about 10 - 10:30. Discussed that can take it every 3 hours instead. He feels the medication wearing off. However very mild tremor right now and he took his pill at 8am. He is having dizziness all the time. That started a month ago. There was no change in medication a month ago. Worse when moving. He is not drinking enough water. In the morning he has coffee. At lunch time he has a sandwhich or half a sandwich and lemonade. And a full glass of water with his supper. No room spinning. He is lightheaded, no CP, no SOB. No falls. No dysphagia. Not with sitting or laying. More upon standing and ambulating.   Interval history 07/2015:He took the sinemet this morning at 7:30am. The tremor is better right now. The sinemet is helping it, it just wears off. Not hard for him to take it 3x  a day. Once in a while he forgets. He doesn't know how long the sinemet lasts. No dizziness when he stands up. Not lightheaded, no symptoms fron the sinemet. Right now at this moment he is happy with his tremor, last took the Sinemet a few hours ago. He gets up at 7:30 amd walks to sheets. He goes to bed between 10 and11pm. Feels like his voice is getting lower. No problems with sleeping or vivid dreams. No falls. Sense of smell and taste is ok. No dysphagia. No memory changes. No hallucinations. No falls. No drooling. Noticed slight tremor in the right hand by the end of the appointment.   Vould try taking the Sinemet every 4 hours. 8am, noon, 4pm and 8pm. In the morning his handwriting is worse but he takes the sinemet and that is  fine, he doesn't need a long-acting at night. But right  Interval update 04/26/2015:He stopped the Sinemet 2 weeks ago. Says it didn't help. Explained that we may need to increase it. Discussed in depth the plan to slowly increase the Sinemet, that this is medication for PD but we need to increase slowly. If it didn't help, dose was too low and we had plans to increase. No side effects.    Visit 3/3: Kyle Mcguire is a 77 y.o. male here as a follow up.   Azilect not helping with the tremor and making him dizzy. Otherwise symptoms are the same.   Reviewed notes, labs and imaging from outside physicians, which showed:  MR [facility] 12/2014: This is a borderline normal MRI of the brain without contrast showing a mild extent of white matter foci that are likely due to age-related microvascular change. There is associated minimal atrophy. There are no acute findings.  Initial visit 12/27/2014: Kyle Mcguire is a 77 y.o. male with no significant PMHx who is here as a consult from Dr. Jacelyn Grip for Tremors. Tremors started 8-9 months ago. Started in the right hand. He noticed it when he tried to write, penmanship was smaller and shaky. Staying about the same. Left arm is  fine. Walking fine. Sense of smell ok. Has constipation. He just moved here from [place]. Lives in a town house. Plays golf, lives independently. Can't read his writing anymore. No stiffness. Father had a tremor. Balance is getting poor, if he bends down he may fall over. Vision is changing, "hazy" vision when driving at night. Worked in trucking for 33 years. He can't pick up as much as he used to. He is very forgetful. He will forget recent conversations, his wife will tell him something and a few hours later he may forget. Dad with Parkinson's Disease. Tremor is mostly at rest or when writing. Was walking the dog and the dog went quickly and he couldn't stop himself from falling down, and broke his wrist. Sometimes when he speeds up he can't stop, has to "stutter step" to turn. No neck pain. He takes daily aspirin, no history of stroke or TIA. Denies DM or heart disease. He takes no medication. He does not drink alcohol and never smoked.   Reviewed notes, labs and imaging from outside physicians, which showed: Was sent from [facility] for evaluation of tremor. CMP wnl, LDL 98,   MRI of the brain: This is a borderline normal MRI of the brain without contrast showing a mild extent of white matter foci that are likely due to age-related microvascular change. There is associated minimal atrophy. There are no acute findings.  Review of Systems: Patient complains of symptoms per HPI as well as the following symptoms: Tremor, no chest pain, no dizziness, no shortness of breath. Pertinent negatives per HPI. All others negative.    Social History   Socioeconomic History  . Marital status: Married    Spouse name: [name]   . Number of children: 2  . Years of education: 75  . Highest education level: Not on file  Occupational History  . Occupation: Retired   Scientific laboratory technician  . Financial resource strain: Not on file  . Food insecurity:    Worry: Not on file    Inability: Not on file  . Transportation  needs:    Medical: Not on file    Non-medical: Not on file  Tobacco Use  . Smoking status: Never Smoker  . Smokeless tobacco: Never  Used  Substance and Sexual Activity  . Alcohol use: No    Alcohol/week: 0.0 standard drinks  . Drug use: No  . Sexual activity: Not on file  Lifestyle  . Physical activity:    Days per week: Not on file    Minutes per session: Not on file  . Stress: Not on file  Relationships  . Social connections:    Talks on phone: Not on file    Gets together: Not on file    Attends religious service: Not on file    Active member of club or organization: Not on file    Attends meetings of clubs or organizations: Not on file    Relationship status: Not on file  . Intimate partner violence:    Fear of current or ex partner: Not on file    Emotionally abused: Not on file    Physically abused: Not on file    Forced sexual activity: Not on file  Other Topics Concern  . Not on file  Social History Narrative   Patient lives at home with wife [name]   Patient has 2 children    Patient has a high school education    Patient is right handed    Patient is retired    Caffeine use: 1 cup coffee.day     Family History  Problem Relation Age of Onset  . Parkinsonism Father     Past Medical History:  Diagnosis Date  . Hearing loss   . Parkinson's disease [facility])     Past Surgical History:  Procedure Laterality Date  . no surgical history per patient    . None      Current Outpatient Medications  Medication Sig Dispense Refill  . aspirin 81 MG chewable tablet Chew 81 mg by mouth daily.    . carbidopa-levodopa (SINEMET IR) 25-100 MG tablet Take 1 tablet by mouth 4 (four) times daily. 120 tablet 11  . FIBER PO Take 1 tablet by mouth at bedtime.    . donepezil (ARICEPT) 10 MG tablet Start with 1/2 tablet for one week and then increase to a whole tablet in the morning 90 tablet 6   No current facility-administered medications for this visit.     Allergies  as of 09/13/2018  . (No Known Allergies)    Vitals: BP 118/74 (BP Location: Right Arm, Patient Position: Sitting)   Pulse (!) 58   Ht 5\' 6"  (1.676 m)   Wt 196 lb (88.9 kg)   BMI 31.64 kg/m  Last Weight:  Wt Readings from Last 1 Encounters:  09/13/18 196 lb (88.9 kg)   Last Height:   Ht Readings from Last 1 Encounters:  09/13/18 5\' 6"  (1.676 m)   MMSE - Mini Mental State Exam 09/13/2018 08/25/2017  Orientation to time 3 3  Orientation to Place 4 5  Registration 3 3  Attention/ Calculation 5 3  Recall 1 1  Language- name 2 objects 2 2  Language- repeat 0 0  Language- follow 3 step command 3 3  Language- read & follow direction 1 1  Write a sentence 0 1  Copy design 0 0  Total score 22 22    Cranial Nerves: decreased blink, hypomimia  The pupils are equal, round, and reactive to light. Visual fields are full to finger confrontation. Extraocular movements are intact. Trigeminal sensation is intact and the muscles of mastication are normal. The face is symmetric. The palate elevates in the midline. Reduced hearing bilat. Voice is normal.  Shoulder shrug is normal. The tongue has normal motion without fasciculations.   Coordination:  Normal rapid alternating movements. 2-3 steps back with push test  Gait:  Reduced arm swing. Minimally shuffling.   Motor Observation:  Mild resting tremor with Intention and postural components that is improved today since he is on the Sinemet Tone:  cogwheeling uppers with facilitation improved  Posture:  Mildly stooped   Strength:  Strength is V/V in the upper and lower limbs.   MMSE - Mini Mental State Exam 09/13/2018 08/25/2017  Orientation to time 3 3  Orientation to Place 4 5  Registration 3 3  Attention/ Calculation 5 3  Recall 1 1  Language- name 2 objects 2 2  Language- repeat 0 0  Language- follow 3 step command 3 3  Language- read & follow direction 1 1  Write a sentence 0 1  Copy design 0 0    Total score 22 22    Assessment/Plan: 77year old wonderful male here for evaluation of tremor, worsening penmanship, balance problems, falls, memory changes. Exam shows parkinsonian symptoms, mild shuffling and reduced arm swing, cogwheeling with facilitation, resting tremor(improved with sinemet). MRI of the brain unremarkable. Patient has been orthostatic in the office, Advised to drink more water. We discontinued Florinef because his blood pressure became too elevated. He was here last appointment with his wife who said that patient does not adequately hydrate during the day and only drinks several cokes. Highly encouraged keeping a fluids diary and drinking 6-8 glasses of water a day or until his urine is light clear yellow.  Parkinson's disease:Continue Sinemet. 1 pills four time a day (8am, 11am, 2pm and 5pm for example). Space 3-4 hours apart. Try to separate Sinemet from food (especially protein-rich foods like meat, dairy, eggs) by about 30-60 mins - this will help the absorption of the medication. If you have some nausea with the medication, you can take it with some light food like crackers or ginger ale. Power over PD group, information given. Document when he noticed the medication wearing off.  Dizziness: Orthostatic hypotension.IMPROVED WITH MORE WATER INTAKE. Denies dizziness today. Started drinking more fluids sparkling water and less pepsi.  (LAST VISIT: Dizziness  can be due to hypovolemia but is also common in Parkinson's disease and with Sinemet i.e. may be neurogenic in nature.-drink more water. Keep a fluids diary. Her wife said not hydrating only drinking several cokes a day. He feels better with increased hydration- orthostatic vitals showed orthostatic vs. neurogenic hypotension.. Could be secondary to medication, dehydration and can also ne seen in PD. IMPROVED WITH MORE WATER INTAKE.- If orthostatic dizziness worsens can consider Northera. Today he feels improved, will  not start now.)    Snoring, daytime somnolence, ESS 12: Sleep eval showed no clinically significant sleep apnea. 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  Mild dementia: Patient has had Parkinson's disease for over a decade, Mini-Mental Status exam is 22/30, suspect Parkinson's disease dementia confirmed on a formal neurocognitive testing. Start Aricept.  Orders Placed This Encounter  Procedures  . Ambulatory referral to Social Work   Meds ordered this encounter  Medications  . donepezil (ARICEPT) 10 MG tablet    Sig: Start with 1/2 tablet for one week and then increase to a whole tablet in the morning    Dispense:  90 tablet    Refill:  6  . carbidopa-levodopa (SINEMET IR) 25-100 MG tablet    Sig: Take 1 tablet by mouth 4 (four) times daily.  Dispense:  120 tablet    Refill:  Garfield, MD  [facility] [address] [place], [place] [address]  Phone [phone] Fax [phone]  A total of 30 minutes was spent face-to-face with this patient. Over half this time was spent on counseling patient on the  1. 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

## 2018-09-14 ENCOUNTER — Encounter: Payer: Self-pay | Admitting: Psychology

## 2018-09-14 NOTE — Progress Notes (Signed)
Received referral from Dr. Jaynee Eagles for patient ands wife to meet with me related to PD resources. I spoke with patient's wife and will be able to meet with them 10/2 at 2pm.

## 2018-09-15 ENCOUNTER — Encounter: Payer: Self-pay | Admitting: Psychology

## 2018-09-16 NOTE — Progress Notes (Signed)
THANK YOU

## 2018-09-16 NOTE — Progress Notes (Signed)
I met the patient and his wife in the clinic.  We talked at length about local resources for dementia and  Parkinson's disease.  The couple has moved from [place] and has lived in [place] for roughly 3 years.  They have a son who lives in [place]. They have advanced care directives healthcare power of attorney, etc. that we talked about with symptoms of dementia and long-term care resources/needs that it may be beneficial to speak with a eldercare attorney.  I provided them with a contact of an eldercare attorney, Delene Ruffini.  I provided both of them with information on [facility] in her upcoming dementia series call the dementia dialogues.  This would be an appropriate program for both the patient and caregiver to attend.  However, the caregiver could attend independently.  The patient currently attends rock steady boxing in [place].  I encouraged them to also attend the power over Parkinson's support group which meets monthly and also provided a brochure on the Parkinson's caregiver support group to the caregiver.  We talked about medication compliance and challenges with remembering to take medication for Parkinson's.  We talked about strategies such as setting an alarm on her phone and I showed them the alarm pillbox.  In addition, we talked about strategies to help with communication and tools to help with any short-term memory issues.  The patient's wife stated that she was planning on getting a white board to help record messages/notes that her husband may need to remember.  When I was inquiring about dementia with the patient, the patient disclosed that he does have visual hallucinations at times.  His wife stated that he has not shared this but with one other person.  She did not think that they have brought this up in the appointment with Dr. Jaynee Eagles.  I told him that I would let Dr. Jaynee Eagles know that this is something he shared in our meeting.  Both the patient and the  caregiver have my contact information shall he have any more questions, needs or resources about the information I provided today.

## 2018-09-22 ENCOUNTER — Other Ambulatory Visit: Payer: Self-pay | Admitting: Neurology

## 2018-10-03 DIAGNOSIS — R350 Frequency of micturition: Secondary | ICD-10-CM | POA: Diagnosis not present

## 2018-10-03 DIAGNOSIS — R42 Dizziness and giddiness: Secondary | ICD-10-CM | POA: Diagnosis not present

## 2018-10-17 DIAGNOSIS — M545 Low back pain: Secondary | ICD-10-CM | POA: Diagnosis not present

## 2018-11-02 DIAGNOSIS — G20 Parkinson's disease: Secondary | ICD-10-CM | POA: Diagnosis not present

## 2018-11-02 DIAGNOSIS — H9193 Unspecified hearing loss, bilateral: Secondary | ICD-10-CM | POA: Diagnosis not present

## 2018-11-02 DIAGNOSIS — M5136 Other intervertebral disc degeneration, lumbar region: Secondary | ICD-10-CM | POA: Diagnosis not present

## 2018-11-02 DIAGNOSIS — M545 Low back pain: Secondary | ICD-10-CM | POA: Diagnosis not present

## 2018-12-02 DIAGNOSIS — H9193 Unspecified hearing loss, bilateral: Secondary | ICD-10-CM | POA: Diagnosis not present

## 2018-12-02 DIAGNOSIS — E669 Obesity, unspecified: Secondary | ICD-10-CM | POA: Diagnosis not present

## 2018-12-02 DIAGNOSIS — M5136 Other intervertebral disc degeneration, lumbar region: Secondary | ICD-10-CM | POA: Diagnosis not present

## 2018-12-02 DIAGNOSIS — M545 Low back pain: Secondary | ICD-10-CM | POA: Diagnosis not present

## 2018-12-02 DIAGNOSIS — G20 Parkinson's disease: Secondary | ICD-10-CM | POA: Diagnosis not present

## 2018-12-02 DIAGNOSIS — B356 Tinea cruris: Secondary | ICD-10-CM | POA: Diagnosis not present

## 2019-02-09 DIAGNOSIS — H04123 Dry eye syndrome of bilateral lacrimal glands: Secondary | ICD-10-CM | POA: Diagnosis not present

## 2019-02-21 DIAGNOSIS — G20 Parkinson's disease: Secondary | ICD-10-CM | POA: Diagnosis not present

## 2019-02-21 DIAGNOSIS — Z125 Encounter for screening for malignant neoplasm of prostate: Secondary | ICD-10-CM | POA: Diagnosis not present

## 2019-02-21 DIAGNOSIS — E669 Obesity, unspecified: Secondary | ICD-10-CM | POA: Diagnosis not present

## 2019-02-25 DIAGNOSIS — E669 Obesity, unspecified: Secondary | ICD-10-CM | POA: Diagnosis not present

## 2019-02-25 DIAGNOSIS — Z0000 Encounter for general adult medical examination without abnormal findings: Secondary | ICD-10-CM | POA: Diagnosis not present

## 2019-02-25 DIAGNOSIS — G20 Parkinson's disease: Secondary | ICD-10-CM | POA: Diagnosis not present

## 2019-03-15 ENCOUNTER — Telehealth: Payer: Self-pay | Admitting: *Deleted

## 2019-03-15 NOTE — Telephone Encounter (Signed)
I called the wife back and LVM (ok per DPR) advising that we can keep pt's same appt time 03/16/2019 @ 2:00 pm but Dr.Ahern would call them to have the appt. I asked for a call back so we could get her official consent for [place] to file this telephone visit to pt's insurance. There may be a patient responsible charge for this however we will not charge a copay. Advised that although there may be limitations with this type of visit, we would do take all precautions to reduce any security or privacy concerns. Also advised that we would need to speak with her to get pt's updated medical information. Pt will also need to be present for the phone visit. Left office number asking for call back.

## 2019-03-15 NOTE — Telephone Encounter (Signed)
Ptt called back in they wanting a tele visit in the afternoon times

## 2019-03-15 NOTE — Telephone Encounter (Signed)
Called pt's wife [name] (on [place]) and LVM (ok per DPR). Advised that we will not have patient come into the office for his appt tomorrow 4/1 d/t covid 19 pandemic. Our office is trying to minimize the risk to our patients and healthcare providers. Advised that we recommend patient convert to a telephone visit or if they prefer not to, they can r/s patient for later date. Asked for a call back to discuss. Left office number in message.

## 2019-03-16 ENCOUNTER — Ambulatory Visit (INDEPENDENT_AMBULATORY_CARE_PROVIDER_SITE_OTHER): Payer: PPO | Admitting: Neurology

## 2019-03-16 ENCOUNTER — Encounter: Payer: Self-pay | Admitting: *Deleted

## 2019-03-16 ENCOUNTER — Ambulatory Visit: Payer: Self-pay | Admitting: Neurology

## 2019-03-16 ENCOUNTER — Other Ambulatory Visit: Payer: Self-pay

## 2019-03-16 DIAGNOSIS — G20 Parkinson's disease: Secondary | ICD-10-CM

## 2019-03-16 DIAGNOSIS — F0280 Dementia in other diseases classified elsewhere without behavioral disturbance: Secondary | ICD-10-CM | POA: Diagnosis not present

## 2019-03-16 MED ORDER — RIVASTIGMINE 4.6 MG/24HR TD PT24: 4.6000 mg | MEDICATED_PATCH | Freq: Every day | TRANSDERMAL | 12 refills | Status: DC

## 2019-03-16 NOTE — Telephone Encounter (Signed)
Noted thank you

## 2019-03-16 NOTE — Telephone Encounter (Signed)
Pt wife(on DPR) has called to inform that they are giving verbal consent to file insurance for the telephone visit @ 2:00 this afternoon.

## 2019-03-16 NOTE — Telephone Encounter (Signed)
Spoke with pt's wife, confirmed pt using two identifiers and reviewed pt's med/sx/soc/fam hx, meds, allergies, and weight/height in preparation for telephone visit today.

## 2019-03-16 NOTE — Progress Notes (Signed)
[facility]    Provider:  Dr Jaynee Eagles Referring Provider: Vernie Shanks, MD Primary Care Physician:  Vernie Shanks, MD CC: PD  Virtual Visit via Video Note  I connected with [name] on 03/17/19 at  2:00 PM EDT by a video enabled telemedicine application and verified that I am speaking with the correct person using two identifiers.   I discussed the limitations of evaluation and management by telemedicine and the availability of in person appointments. The patient expressed understanding and agreed to proceed.  Kyle Beam, MD  Interval history 03/16/2019: Spoke with wife and patient. They are feeling well. They are staying in the house. No falls. Tremor has not worsened. They have a timer on his phone. He has not been dizzy, stopped drinking pepsi totally, no dizziness or lightheadedness. He drinks water and sparkling water. No swallowing problems. He has lost weight but this is good. He is going to rock steady 2x a week. Memory stable. Still driving, no accidents. Aricept causing diarrhea will try an exelon patch.  Interval history 09/13/2018: Lovely gentleman here with his wife for follow up of PD.  He was seen by Dr. Marcos Eke who provided much information on his condition and also recommendations such as activities and exercise. 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 Will request they meet with the social worker at [facility]. He goes ro [employer] 2x a week which is new and excellent.  He has not been dizzy lately, discussed hydration. No recent falls - 5-6 months ago he fell but none since . No swallowing difficulties.  No difficulties with the Sinemet. Tremors are stable, not bothersome. He is stable, does not want to change any management, will keep om the same dose of Sinemet. He is drinking more sprakling water and no pepsi and he likes it. No problems swallowing. Starting to have back problems. He is inactive most days except the boxing now 2x a week. He  was dxed with REM sleep disorder but it doesn't affect him. They bowl every Monday.   Interval history 08/25/2017: Had falls when on Flexeril but none since then because it made him dizzy. Still having dizziness. Memory changes started slowly 3-4 years ago and slowly progressive. Started with short-term memory issues. He would forget sometimes which road to take, little things. But not progressive and wosening. Wife has been managing his medications "for a while". Confusion worsens later in the day.He used to pay the bills but now his wife pays for them. Wife makes out the checks because of the shaking. Wife found bills in the past that were not paid. He is less social. He doesn't remember their anniversary anymore, he used to all the time. He is in a different room because of the snoring, he nods off a lot during the day. Wife is here and provides all information. He gets up and has breakfast, he broses through the paper and then he lays on the couch until 11am nodding off. He sits in the recliner all afternoon. Wife feels that he is developing dementia and has had a change in personality.has changed, he used to be very active, he used to work part time. He does take care of the laundry and cleans up after dinner. They started bowling, wife is trying to get him out more often.He is anxious and worrying a lot.He worries a lot. He has mellowed which is good. No hallucinations.     Interval history: He feels like the  dizziness has improved. Discussed northera. He feels that the dizziness is not too significant. We tried Florinef but his bllod pressure was too elevated. He last took the Sinemet at 7 this morning and he has no tremor, then he takes it at noon and then at 7pm and he goes to sleep at 10pm. He feels under control. No risk of fallin gfrom the dizziness, he has been drinking more water and feels better and less dizzy. His blood pressures from the last month have ranged 120-143/ 77-89 which is  excellent. Hydrating more. One appointment was 101/82.  3-4 glasses of water a day and lemonade as well. No swallowing issues. No falls.   HPI: Kyle Mcguire is a wonderful 78 y.o. male here as a follow up for parkinson's disease. He is doing on Sinemet. 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 He was started on Florinef at last appointment and comes in today with his wife for the first time to discuss.  Interval history 09/25/2016: Patient comes in with wife today, this the first time I'm meeting her. We reviewed the patient's history and the medications we have tried. Patient's tremor responds to Sinemet. In the past we had him on 3 pills a day and patient felt as though the Effexor good but he felt them wearing off during the day. We increased him to 4 pills. Discussed the proper way to take it, without protein as this might interfere with absorption. Also advised he can take the pills every 3-4 hours. I asked his wife to notice when she sees the tremor improved after taking medication and if she notices the medications wearing off. Also had a long discussion about hydration, patient drinks several Cokes daily but does not drink much fluid. I advised that this may be what is causing his dizziness when he stands up. Orthostatic hypotension can also be seen in Parkinson's disease or caused by Sinemet. Asked them to keep a fluids diary. The Florinef is making his blood pressure too high advised him to stop it.  Interval history 08/2016: Tremor is good. He is dizzy all the time. Things are blurry when he looks at them. He feels dizzy even now when sitting. His orthostatics have been normal. He takes the carbidopa/levodopa 4 x a day. He last took the medication at 8am this morning and his tremor is fine. Will go back to 3 pills of Sinemet a day and see if this improved his dizziness. if needed, florinef 0.1, 1/2 pill up to bid for starters  Interval  history 01/2016:He is on 1 tab 4 times a day every 4 hours. His tremor is better. He takes the medication around 8am he feels the tremor about 10 - 10:30. Discussed that can take it every 3 hours instead. He feels the medication wearing off. However very mild tremor right now and he took his pill at 8am. He is having dizziness all the time. That started a month ago. There was no change in medication a month ago. Worse when moving. He is not drinking enough water. In the morning he has coffee. At lunch time he has a sandwhich or half a sandwich and lemonade. And a full glass of water with his supper. No room spinning. He is lightheaded, no CP, no SOB. No falls. No dysphagia. Not with sitting or laying. More upon standing and ambulating.   Interval history 07/2015:He took the sinemet this morning at 7:30am. The tremor is better right now. The sinemet  is helping it, it just wears off. Not hard for him to take it 3x a day. Once in a while he forgets. He doesn't know how long the sinemet lasts. No dizziness when he stands up. Not lightheaded, no symptoms fron the sinemet. Right now at this moment he is happy with his tremor, last took the Sinemet a few hours ago. He gets up at 7:30 amd walks to sheets. He goes to bed between 10 and11pm. Feels like his voice is getting lower. No problems with sleeping or vivid dreams. No falls. Sense of smell and taste is ok. No dysphagia. No memory changes. No hallucinations. No falls. No drooling. Noticed slight tremor in the right hand by the end of the appointment.   Vould try taking the Sinemet every 4 hours. 8am, noon, 4pm and 8pm. In the morning his handwriting is worse but he takes the sinemet and that is fine, he doesn't need a long-acting at night. But right  Interval update 04/26/2015:He stopped the Sinemet 2 weeks ago. Says it didn't help. Explained that we may need to increase it. Discussed in depth the plan to slowly increase the Sinemet, that this is medication  for PD but we need to increase slowly. If it didn't help, dose was too low and we had plans to increase. No side effects.    Visit 3/3: MUSA REWERTS is a 78 y.o. male here as a follow up.   Azilect not helping with the tremor and making him dizzy. Otherwise symptoms are the same.   Reviewed notes, labs and imaging from outside physicians, which showed:  MR [facility] 12/2014: This is a borderline normal MRI of the brain without contrast showing a mild extent of white matter foci that are likely due to age-related microvascular change. There is associated minimal atrophy. There are no acute findings.  Initial visit 12/27/2014: REGGINALD PASK is a 78 y.o. male with no significant PMHx who is here as a consult from Dr. Jacelyn Grip for Tremors. Tremors started 8-9 months ago. Started in the right hand. He noticed it when he tried to write, penmanship was smaller and shaky. Staying about the same. Left arm is fine. Walking fine. Sense of smell ok. Has constipation. He just moved here from [place]. Lives in a town house. Plays golf, lives independently. Can't read his writing anymore. No stiffness. Father had a tremor. Balance is getting poor, if he bends down he may fall over. Vision is changing, "hazy" vision when driving at night. Worked in trucking for 33 years. He can't pick up as much as he used to. He is very forgetful. He will forget recent conversations, his wife will tell him something and a few hours later he may forget. Dad with Parkinson's Disease. Tremor is mostly at rest or when writing. Was walking the dog and the dog went quickly and he couldn't stop himself from falling down, and broke his wrist. Sometimes when he speeds up he can't stop, has to "stutter step" to turn. No neck pain. He takes daily aspirin, no history of stroke or TIA. Denies DM or heart disease. He takes no medication. He does not drink alcohol and never smoked.   Reviewed notes, labs and imaging from outside physicians,  which showed: Was sent from [facility] for evaluation of tremor. CMP wnl, LDL 98,   MRI of the brain: This is a borderline normal MRI of the brain without contrast showing a mild extent of white matter foci that are likely due to  age-related microvascular change. There is associated minimal atrophy. There are no acute findings.  Review of Systems: Patient complains of symptoms per HPI as well as the following symptoms: Tremor, no chest pain, no dizziness, no shortness of breath. Pertinent negatives per HPI. All others negative.    Social History   Socioeconomic History   Marital status: Married    Spouse name: [name]    Number of children: 2   Years of education: 12   Highest education level: Not on file  Occupational History   Occupation: Retired   Scientist, product/process development strain: Not on Training and development officer insecurity:    Worry: Not on file    Inability: Not on Lexicographer needs:    Medical: Not on file    Non-medical: Not on file  Tobacco Use   Smoking status: Never Smoker   Smokeless tobacco: Never Used  Substance and Sexual Activity   Alcohol use: No    Alcohol/week: 0.0 standard drinks   Drug use: No   Sexual activity: Not on file  Lifestyle   Physical activity:    Days per week: Not on file    Minutes per session: Not on file   Stress: Not on file  Relationships   Social connections:    Talks on phone: Not on file    Gets together: Not on file    Attends religious service: Not on file    Active member of club or organization: Not on file    Attends meetings of clubs or organizations: Not on file    Relationship status: Not on file   Intimate partner violence:    Fear of current or ex partner: Not on file    Emotionally abused: Not on file    Physically abused: Not on file    Forced sexual activity: Not on file  Other Topics Concern   Not on file  Social History Narrative   Patient lives at home with wife [name]   Patient  has 2 children    Patient has a high school education    Patient is right handed    Patient is retired    Caffeine use: 1 cup coffee (not daily)    Family History  Problem Relation Age of Onset   Parkinsonism Father     Past Medical History:  Diagnosis Date   Hearing loss    Parkinson's disease ([place])     Past Surgical History:  Procedure Laterality Date   no surgical history per patient     None      Current Outpatient Medications  Medication Sig Dispense Refill   aspirin 81 MG chewable tablet Chew 81 mg by mouth daily.     carbidopa-levodopa (SINEMET IR) 25-100 MG tablet Take 1 tablet by mouth 4 (four) times daily. 120 tablet 11   FIBER PO Take 1 tablet by mouth at bedtime.     rivastigmine (EXELON) 4.6 mg/24hr Place 1 patch (4.6 mg total) onto the skin daily. 30 patch 12   No current facility-administered medications for this visit.     Allergies as of 03/16/2019   (No Known Allergies)    Vitals: There were no vitals taken for this visit. Last Weight:  Wt Readings from Last 1 Encounters:  03/16/19 193 lb (87.5 kg)   Last Height:   Ht Readings from Last 1 Encounters:  03/16/19 5\' 5"  (1.651 m)   MMSE - Mini Mental State Exam 09/13/2018 08/25/2017  Orientation  to time 3 3  Orientation to Place 4 5  Registration 3 3  Attention/ Calculation 5 3  Recall 1 1  Language- name 2 objects 2 2  Language- repeat 0 0  Language- follow 3 step command 3 3  Language- read & follow direction 1 1  Write a sentence 0 1  Copy design 0 0  Total score 22 22    Cranial Nerves: decreased blink, hypomimia  The pupils are equal, round, and reactive to light. Visual fields are full to finger confrontation. Extraocular movements are intact. Trigeminal sensation is intact and the muscles of mastication are normal. The face is symmetric. The palate elevates in the midline. Reduced hearing bilat. Voice is normal. Shoulder shrug is normal. The tongue has normal motion  without fasciculations.   Coordination:  Normal rapid alternating movements. 2-3 steps back with push test  Gait:  Reduced arm swing. Minimally shuffling.   Motor Observation:  Mild resting tremor with Intention and postural components that is improved today since he is on the Sinemet Tone:  cogwheeling uppers with facilitation improved  Posture:  Mildly stooped   Strength:  Strength is V/V in the upper and lower limbs.   MMSE - Mini Mental State Exam 09/13/2018 08/25/2017  Orientation to time 3 3  Orientation to Place 4 5  Registration 3 3  Attention/ Calculation 5 3  Recall 1 1  Language- name 2 objects 2 2  Language- repeat 0 0  Language- follow 3 step command 3 3  Language- read & follow direction 1 1  Write a sentence 0 1  Copy design 0 0  Total score 22 22    Assessment/Plan: 78 year old wonderful male here for evaluation of tremor, worsening penmanship, balance problems, falls, memory changes. Exam shows parkinsonian symptoms, mild shuffling and reduced arm swing, cogwheeling with facilitation, resting tremor(improved with sinemet). MRI of the brain unremarkable. Patient has been orthostatic in the office, Advised to drink more water. We discontinued Florinef because his blood pressure became too elevated. He was here last appointment with his wife who said that patient does not adequately hydrate during the day and only drinks several cokes. Highly encouraged keeping a fluids diary and drinking 6-8 glasses of water a day or until his urine is light clear yellow.  Parkinson's disease:Continue Sinemet. 1 pills four time a day (8am, 11am, 2pm and 5pm for example). Space 3-4 hours apart. Try to separate Sinemet from food (especially protein-rich foods like meat, dairy, eggs) by about 30-60 mins - this will help the absorption of the medication. If you have some nausea with the medication, you can take it with some light food like crackers or ginger ale.  Power over PD group, information given. Document when he noticed the medication wearing off.  Dizziness: Orthostatic hypotension.IMPROVED WITH MORE WATER INTAKE. Denies dizziness today. Started drinking more fluids sparkling water and less pepsi.  (LAST VISIT: Dizziness  can be due to hypovolemia but is also common in Parkinson's disease and with Sinemet i.e. may be neurogenic in nature.-drink more water. Keep a fluids diary. Her wife said not hydrating only drinking several cokes a day. He feels better with increased hydration- orthostatic vitals showed orthostatic vs. neurogenic hypotension.. Could be secondary to medication, dehydration and can also ne seen in PD. IMPROVED WITH MORE WATER INTAKE.- If orthostatic dizziness worsens can consider Northera. Today he feels improved, will not start now.)    Snoring, daytime somnolence, ESS 12: Sleep eval showed no clinically  significant sleep apnea. 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  Mild dementia: Patient has had Parkinson's disease for over a decade, Mini-Mental Status exam is 22/30, suspect Parkinson's disease dementia confirmed on a formal neurocognitive testing. Aricept with diarrhea, start Exelon Patch. Called pharmacy, will be $15 a month and called them back, they are ok with this.  Follow Up Instructions:    I discussed the assessment and treatment plan with the patient. The patient was provided an opportunity to ask questions and all were answered. The patient agreed with the plan and demonstrated an understanding of the instructions.   The patient was advised to call back or seek an in-person evaluation if the symptoms worsen or if the condition fails to improve as anticipated.  I provided 15 minutes of video-face-to-face time during this encounter.  A total of 15 minutes was spent video face-to-face with this patient. Over half this time was spent on counseling patient on the  1. Parkinson's disease ([place])   2. Dementia due to  Parkinson's disease without behavioral disturbance ([place])    diagnosis and different diagnostic and therapeutic options, counseling and coordination of care, risks ans benefits of management, compliance, or risk factor reduction and education.      Sarina Ill, MD  [facility] [address] [place], [place] [address]  Phone [phone] Fax [phone]

## 2019-03-17 ENCOUNTER — Encounter: Payer: Self-pay | Admitting: Neurology

## 2019-03-17 DIAGNOSIS — G20 Parkinson's disease: Secondary | ICD-10-CM

## 2019-03-17 DIAGNOSIS — F0280 Dementia in other diseases classified elsewhere without behavioral disturbance: Secondary | ICD-10-CM | POA: Insufficient documentation

## 2019-04-28 DIAGNOSIS — F0280 Dementia in other diseases classified elsewhere without behavioral disturbance: Secondary | ICD-10-CM | POA: Diagnosis not present

## 2019-04-28 DIAGNOSIS — M545 Low back pain: Secondary | ICD-10-CM | POA: Diagnosis not present

## 2019-04-28 DIAGNOSIS — E669 Obesity, unspecified: Secondary | ICD-10-CM | POA: Diagnosis not present

## 2019-04-28 DIAGNOSIS — G20 Parkinson's disease: Secondary | ICD-10-CM | POA: Diagnosis not present

## 2019-08-19 DIAGNOSIS — R6883 Chills (without fever): Secondary | ICD-10-CM | POA: Diagnosis not present

## 2019-09-17 ENCOUNTER — Other Ambulatory Visit: Payer: Self-pay | Admitting: Neurology

## 2019-11-28 ENCOUNTER — Telehealth: Payer: Self-pay | Admitting: Neurology

## 2019-11-28 NOTE — Telephone Encounter (Signed)
Pts called in and she states they dont know how to use a computer but doesnt feel safe with him in office. She wants to know if his visit can be done as a Telephone visit.Please advise

## 2019-12-13 ENCOUNTER — Encounter: Payer: Self-pay | Admitting: Neurology

## 2019-12-13 ENCOUNTER — Ambulatory Visit (INDEPENDENT_AMBULATORY_CARE_PROVIDER_SITE_OTHER): Payer: PPO | Admitting: Neurology

## 2019-12-13 ENCOUNTER — Other Ambulatory Visit: Payer: Self-pay

## 2019-12-13 DIAGNOSIS — G20 Parkinson's disease: Secondary | ICD-10-CM | POA: Diagnosis not present

## 2019-12-13 NOTE — Progress Notes (Signed)
[id number]    Provider:  Dr Jaynee Eagles Referring Provider: Vernie Shanks, MD Primary Care Physician:  Vernie Shanks, MD CC: PD  Virtual Visit via Telephone Note  I connected with [name] on 12/13/19 at 11:00 AM EST by telephone and verified that I am speaking with the correct person using two identifiers.  Location: Patient: home Provider: office   I discussed the limitations, risks, security and privacy concerns of performing an evaluation and management service by telephone and the availability of in person appointments. I also discussed with the patient that there may be a patient responsible charge related to this service. The patient expressed understanding and agreed to proceed.   Follow Up Instructions:    I discussed the assessment and treatment plan with the patient. The patient was provided an opportunity to ask questions and all were answered. The patient agreed with the plan and demonstrated an understanding of the instructions.   The patient was advised to call back or seek an in-person evaluation if the symptoms worsen or if the condition fails to improve as anticipated.  I provided 25  minutes of non-face-to-face time during this encounter.   [name], MD    Wife says she has to help him with a lot more things. He is very mellow, good mood. He doesn't complain of anything. She hs to give him cues to do things like brush teeth. No pain. He still is not very active. He does however take a short walk every day. He drinks pepsi daily, he also drinks a lot of flavored water. Try to get low cal and pepsi no caffeine. Discussed with wife in detail. He is still dizzy but it is better. He has started falling, balance is deteriorating. We will have PT to his house for walking assistance training and balance    Interval history 03/16/2019: Spoke with wife and patient. They are feeling well. They are staying in the house. No falls. Tremor has  not worsened. They have a timer on his phone. He has not been dizzy, stopped drinking pepsi totally, no dizziness or lightheadedness. He drinks water and sparkling water. No swallowing problems. He has lost weight but this is good. He is going to rock steady 2x a week. Memory stable. Still driving, no accidents. Aricept causing diarrhea will try an exelon patch.  Interval history 09/13/2018: Lovely gentleman here with his wife for follow up of PD.  He was seen by Dr. Marcos Eke who provided much information on his condition and also recommendations such as activities and exercise. 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 Will request they meet with the social worker at [facility]. He goes ro [employer] 2x a week which is new and excellent.  He has not been dizzy lately, discussed hydration. No recent falls - 5-6 months ago he fell but none since . No swallowing difficulties.  No difficulties with the Sinemet. Tremors are stable, not bothersome. He is stable, does not want to change any management, will keep om the same dose of Sinemet. He is drinking more sprakling water and no pepsi and he likes it. No problems swallowing. Starting to have back problems. He is inactive most days except the boxing now 2x a week. He was dxed with REM sleep disorder but it doesn't affect him. They bowl every Monday.   Interval history 08/25/2017: Had falls when on Flexeril but none since then because it made him dizzy. Still having dizziness. Memory changes  started slowly 3-4 years ago and slowly progressive. Started with short-term memory issues. He would forget sometimes which road to take, little things. But not progressive and wosening. Wife has been managing his medications "for a while". Confusion worsens later in the day.He used to pay the bills but now his wife pays for them. Wife makes out the checks because of the shaking. Wife found bills in the past that were not paid. He is less social. He doesn't remember their  anniversary anymore, he used to all the time. He is in a different room because of the snoring, he nods off a lot during the day. Wife is here and provides all information. He gets up and has breakfast, he broses through the paper and then he lays on the couch until 11am nodding off. He sits in the recliner all afternoon. Wife feels that he is developing dementia and has had a change in personality.has changed, he used to be very active, he used to work part time. He does take care of the laundry and cleans up after dinner. They started bowling, wife is trying to get him out more often.He is anxious and worrying a lot.He worries a lot. He has mellowed which is good. No hallucinations.     Interval history: He feels like the dizziness has improved. Discussed northera. He feels that the dizziness is not too significant. We tried Florinef but his bllod pressure was too elevated. He last took the Sinemet at 7 this morning and he has no tremor, then he takes it at noon and then at 7pm and he goes to sleep at 10pm. He feels under control. No risk of fallin gfrom the dizziness, he has been drinking more water and feels better and less dizzy. His blood pressures from the last month have ranged 120-143/ 77-89 which is excellent. Hydrating more. One appointment was 101/82.  3-4 glasses of water a day and lemonade as well. No swallowing issues. No falls.   HPI: Kyle Mcguire is a wonderful 78 y.o. male here as a follow up for parkinson's disease. He is doing on Sinemet. 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 He was started on Florinef at last appointment and comes in today with his wife for the first time to discuss.  Interval history 09/25/2016: Patient comes in with wife today, this the first time I'm meeting her. We reviewed the patient's history and the medications we have tried. Patient's tremor responds to Sinemet. In the past we had him on 3 pills a day  and patient felt as though the Effexor good but he felt them wearing off during the day. We increased him to 4 pills. Discussed the proper way to take it, without protein as this might interfere with absorption. Also advised he can take the pills every 3-4 hours. I asked his wife to notice when she sees the tremor improved after taking medication and if she notices the medications wearing off. Also had a long discussion about hydration, patient drinks several Cokes daily but does not drink much fluid. I advised that this may be what is causing his dizziness when he stands up. Orthostatic hypotension can also be seen in Parkinson's disease or caused by Sinemet. Asked them to keep a fluids diary. The Florinef is making his blood pressure too high advised him to stop it.  Interval history 08/2016: Tremor is good. He is dizzy all the time. Things are blurry when he looks at them. He feels dizzy  even now when sitting. His orthostatics have been normal. He takes the carbidopa/levodopa 4 x a day. He last took the medication at 8am this morning and his tremor is fine. Will go back to 3 pills of Sinemet a day and see if this improved his dizziness. if needed, florinef 0.1, 1/2 pill up to bid for starters  Interval history 01/2016:He is on 1 tab 4 times a day every 4 hours. His tremor is better. He takes the medication around 8am he feels the tremor about 10 - 10:30. Discussed that can take it every 3 hours instead. He feels the medication wearing off. However very mild tremor right now and he took his pill at 8am. He is having dizziness all the time. That started a month ago. There was no change in medication a month ago. Worse when moving. He is not drinking enough water. In the morning he has coffee. At lunch time he has a sandwhich or half a sandwich and lemonade. And a full glass of water with his supper. No room spinning. He is lightheaded, no CP, no SOB. No falls. No dysphagia. Not with sitting or laying. More  upon standing and ambulating.   Interval history 07/2015:He took the sinemet this morning at 7:30am. The tremor is better right now. The sinemet is helping it, it just wears off. Not hard for him to take it 3x a day. Once in a while he forgets. He doesn't know how long the sinemet lasts. No dizziness when he stands up. Not lightheaded, no symptoms fron the sinemet. Right now at this moment he is happy with his tremor, last took the Sinemet a few hours ago. He gets up at 7:30 amd walks to sheets. He goes to bed between 10 and11pm. Feels like his voice is getting lower. No problems with sleeping or vivid dreams. No falls. Sense of smell and taste is ok. No dysphagia. No memory changes. No hallucinations. No falls. No drooling. Noticed slight tremor in the right hand by the end of the appointment.   Vould try taking the Sinemet every 4 hours. 8am, noon, 4pm and 8pm. In the morning his handwriting is worse but he takes the sinemet and that is fine, he doesn't need a long-acting at night. But right  Interval update 04/26/2015:He stopped the Sinemet 2 weeks ago. Says it didn't help. Explained that we may need to increase it. Discussed in depth the plan to slowly increase the Sinemet, that this is medication for PD but we need to increase slowly. If it didn't help, dose was too low and we had plans to increase. No side effects.    Visit 3/3: JACQUELYN VILLELLA is a 78 y.o. male here as a follow up.   Azilect not helping with the tremor and making him dizzy. Otherwise symptoms are the same.   Reviewed notes, labs and imaging from outside physicians, which showed:  MR Brsain 12/2014: This is a borderline normal MRI of the brain without contrast showing a mild extent of white matter foci that are likely due to age-related microvascular change. There is associated minimal atrophy. There are no acute findings.  Initial visit 12/27/2014: BURCHELL GONYA is a 78 y.o. male with no significant PMHx who  is here as a consult from Dr. Jacelyn Grip for Tremors. Tremors started 8-9 months ago. Started in the right hand. He noticed it when he tried to write, penmanship was smaller and shaky. Staying about the same. Left arm is fine. Walking fine. Sense  of smell ok. Has constipation. He just moved here from [place]. Lives in a town house. Plays golf, lives independently. Can't read his writing anymore. No stiffness. Father had a tremor. Balance is getting poor, if he bends down he may fall over. Vision is changing, "hazy" vision when driving at night. Worked in trucking for 33 years. He can't pick up as much as he used to. He is very forgetful. He will forget recent conversations, his wife will tell him something and a few hours later he may forget. Dad with Parkinson's Disease. Tremor is mostly at rest or when writing. Was walking the dog and the dog went quickly and he couldn't stop himself from falling down, and broke his wrist. Sometimes when he speeds up he can't stop, has to "stutter step" to turn. No neck pain. He takes daily aspirin, no history of stroke or TIA. Denies DM or heart disease. He takes no medication. He does not drink alcohol and never smoked.   Reviewed notes, labs and imaging from outside physicians, which showed: Was sent from [facility] for evaluation of tremor. CMP wnl, LDL 98,   MRI of the brain: This is a borderline normal MRI of the brain without contrast showing a mild extent of white matter foci that are likely due to age-related microvascular change. There is associated minimal atrophy. There are no acute findings.  Review of Systems: Patient complains of symptoms per HPI as well as the following symptoms: Tremor, no chest pain, no dizziness, no shortness of breath. Pertinent negatives per HPI. All others negative.    Social History   Socioeconomic History   Marital status: Married    Spouse name: [name]    Number of children: 2   Years of education: 12   Highest education  level: Not on file  Occupational History   Occupation: Retired   Tobacco Use   Smoking status: Never Smoker   Smokeless tobacco: Never Used  Substance and Sexual Activity   Alcohol use: No    Alcohol/week: 0.0 standard drinks   Drug use: No   Sexual activity: Not on file  Other Topics Concern   Not on file  Social History Narrative   Patient lives at home with wife [name]   Patient has 2 children    Patient has a high school education    Patient is right handed    Patient is retired    Caffeine use: 1 cup coffee (not daily)   Social Determinants of Radio broadcast assistant Strain:    Difficulty of Paying Living Expenses: Not on file  Food Insecurity:    Worried About Charity fundraiser in the Last Year: Not on file   [employer] of Food in the Last Year: Not on file  Transportation Needs:    Lack of Transportation (Medical): Not on file   Lack of Transportation (Non-Medical): Not on file  Physical Activity:    Days of Exercise per Week: Not on file   Minutes of Exercise per Session: Not on file  Stress:    Feeling of Stress : Not on file  Social Connections:    Frequency of Communication with Friends and Family: Not on file   Frequency of Social Gatherings with Friends and Family: Not on file   Attends Religious Services: Not on file   Active Member of Clubs or Organizations: Not on file   Attends Archivist Meetings: Not on file   Marital Status: Not on file  Intimate Partner Violence:    Fear of Current or Ex-Partner: Not on file   Emotionally Abused: Not on file   Physically Abused: Not on file   Sexually Abused: Not on file    Family History  Problem Relation Age of Onset   Parkinsonism Father     Past Medical History:  Diagnosis Date   Hearing loss    Parkinson's disease [facility])     Past Surgical History:  Procedure Laterality Date   no surgical history per patient     None      Current Outpatient  Medications  Medication Sig Dispense Refill   aspirin 81 MG chewable tablet Chew 81 mg by mouth daily.     carbidopa-levodopa (SINEMET IR) 25-100 MG tablet TAKE 1 TABLET BY MOUTH FOUR TIMES DAILY 120 tablet 11   FIBER PO Take 1 tablet by mouth at bedtime.     rivastigmine (EXELON) 4.6 mg/24hr Place 1 patch (4.6 mg total) onto the skin daily. 30 patch 12   No current facility-administered medications for this visit.    Allergies as of 12/13/2019   (No Known Allergies)    Vitals: There were no vitals taken for this visit. Last Weight:  Wt Readings from Last 1 Encounters:  03/16/19 193 lb (87.5 kg)   Last Height:   Ht Readings from Last 1 Encounters:  03/16/19 5\' 5"  (1.651 m)   MMSE - Mini Mental State Exam 09/13/2018 08/25/2017  Orientation to time 3 3  Orientation to Place 4 5  Registration 3 3  Attention/ Calculation 5 3  Recall 1 1  Language- name 2 objects 2 2  Language- repeat 0 0  Language- follow 3 step command 3 3  Language- read & follow direction 1 1  Write a sentence 0 1  Copy design 0 0  Total score 22 22    Cranial Nerves: decreased blink, hypomimia  The pupils are equal, round, and reactive to light. Visual fields are full to finger confrontation. Extraocular movements are intact. Trigeminal sensation is intact and the muscles of mastication are normal. The face is symmetric. The palate elevates in the midline. Reduced hearing bilat. Voice is normal. Shoulder shrug is normal. The tongue has normal motion without fasciculations.   Coordination:  Normal rapid alternating movements. 2-3 steps back with push test  Gait:  Reduced arm swing. Minimally shuffling.   Motor Observation:  Mild resting tremor with Intention and postural components that is improved today since he is on the Sinemet Tone:  cogwheeling uppers with facilitation improved  Posture:  Mildly stooped   Strength:  Strength is V/V in the upper and lower limbs.    MMSE - Mini Mental State Exam 09/13/2018 08/25/2017  Orientation to time 3 3  Orientation to Place 4 5  Registration 3 3  Attention/ Calculation 5 3  Recall 1 1  Language- name 2 objects 2 2  Language- repeat 0 0  Language- follow 3 step command 3 3  Language- read & follow direction 1 1  Write a sentence 0 1  Copy design 0 0  Total score 22 22    Assessment/Plan: 78 year old wonderful male here for evaluation of tremor, worsening penmanship, balance problems, falls, memory changes. Exam shows parkinsonian symptoms, mild shuffling and reduced arm swing, cogwheeling with facilitation, resting tremor(improved with sinemet). MRI of the brain unremarkable. Patient has been orthostatic in the office, Advised to drink more water. We discontinued Florinef because his blood pressure became too elevated.  He was here last appointment with his wife who said that patient does not adequately hydrate during the day and only drinks several cokes. Highly encouraged keeping a fluids diary and drinking 6-8 glasses of water a day or until his urine is light clear yellow.  Parkinson's disease:Stable. Continue Sinemet. 1 pills four time a day (8am, 11am, 2pm and 5pm for example). Space 3-4 hours apart. Try to separate Sinemet from food (especially protein-rich foods like meat, dairy, eggs) by about 30-60 mins - this will help the absorption of the medication. If you have some nausea with the medication, you can take it with some light food like crackers or ginger ale. Power over PD group, information given. Document when he noticed the medication wearing off.  Dizziness: Orthostatic hypotension.IMPROVED WITH MORE WATER INTAKE. Denies dizziness today. Started drinking more fluids sparkling water and less pepsi.stable.  (LAST VISIT: Dizziness  can be due to hypovolemia but is also common in Parkinson's disease and with Sinemet i.e. may be neurogenic in nature.-drink more water. Keep a fluids diary. Her wife  said not hydrating only drinking several cokes a day. He feels better with increased hydration- orthostatic vitals showed orthostatic vs. neurogenic hypotension.. Could be secondary to medication, dehydration and can also ne seen in PD. IMPROVED WITH MORE WATER INTAKE.- If orthostatic dizziness worsens can consider Northera. Today he feels improved, will not start now.)    Snoring, daytime somnolence, ESS 12: Sleep eval showed no clinically significant sleep apnea. 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 Aricept with diarrhea, start Exelon Patch. Called pharmacy, will be $15 a month and called them back, they are ok with this. Continue. Stable.   Sarina Ill, MD  [facility] [address] [place], [place] [address]  Phone [phone] Fax [phone]

## 2020-01-11 ENCOUNTER — Ambulatory Visit: Payer: PPO

## 2020-01-17 ENCOUNTER — Telehealth: Payer: Self-pay

## 2020-01-17 DIAGNOSIS — Z9181 History of falling: Secondary | ICD-10-CM

## 2020-01-17 DIAGNOSIS — G20 Parkinson's disease: Secondary | ICD-10-CM

## 2020-01-17 DIAGNOSIS — R269 Unspecified abnormalities of gait and mobility: Secondary | ICD-10-CM

## 2020-01-17 NOTE — Telephone Encounter (Signed)
Patient wife called stating patient is needing a prescription for a walker and cane since is having difficulty with balance issues.  Please follow up

## 2020-01-18 NOTE — Telephone Encounter (Signed)
I really don't know what kind of walker or assistive devices he needs, we can send referral to PT and have him evaluated so he can get the right one which is what I recommend thanks.

## 2020-01-18 NOTE — Telephone Encounter (Signed)
I spoke with Dr. Jaynee Eagles. Order placed for outpatient PT. Spoke with pt's wife and Dr. Jaynee Eagles. Pt to have PT & also be evaluated for walking aids.

## 2020-01-18 NOTE — Telephone Encounter (Signed)
Patient wife called stating they would no longer want at home PT and would prefer for patient to go in clinic.  Please follow up

## 2020-01-18 NOTE — Telephone Encounter (Signed)
Wiechman,[name](wife) has called because she has not heard a response to her request.  The response from Dr Jaynee Eagles was read to her, wife stated if possible she'd like PT at their home for pt.

## 2020-01-18 NOTE — Addendum Note (Signed)
Addended by: Gildardo Griffes on: 01/18/2020 05:10 PM   Modules accepted: Orders

## 2020-01-19 NOTE — Addendum Note (Signed)
Addended by: Gildardo Griffes on: 01/19/2020 07:15 AM   Modules accepted: Orders

## 2020-01-20 ENCOUNTER — Ambulatory Visit: Payer: PPO | Attending: Internal Medicine

## 2020-01-20 DIAGNOSIS — Z23 Encounter for immunization: Secondary | ICD-10-CM | POA: Insufficient documentation

## 2020-01-20 NOTE — Progress Notes (Signed)
   Covid-19 Vaccination Clinic  Name:  Kyle Mcguire    MRN: [id number] DOB: [birth year]  01/20/2020  [name] was observed post Covid-19 immunization for 15 minutes without incidence. He was provided with Vaccine Information Sheet and instruction to access the V-Safe system.   [name] was instructed to call 911 with any severe reactions post vaccine: [name] Difficulty breathing  . Swelling of your face and throat  . A fast heartbeat  . A bad rash all over your body  . Dizziness and weakness    Immunizations Administered    Name Date Dose VIS Date Route   Pfizer COVID-19 Vaccine 01/20/2020 10:37 AM 0.3 mL 11/25/2019 Intramuscular   Manufacturer: [place]   Lot: [id number]   [place]: [id number]

## 2020-01-31 ENCOUNTER — Ambulatory Visit: Payer: PPO | Admitting: Physical Therapy

## 2020-02-08 ENCOUNTER — Encounter: Payer: Self-pay | Admitting: Physical Therapy

## 2020-02-08 ENCOUNTER — Ambulatory Visit: Payer: PPO | Attending: Neurology | Admitting: Physical Therapy

## 2020-02-08 ENCOUNTER — Other Ambulatory Visit: Payer: Self-pay

## 2020-02-08 DIAGNOSIS — M25652 Stiffness of left hip, not elsewhere classified: Secondary | ICD-10-CM | POA: Diagnosis not present

## 2020-02-08 DIAGNOSIS — M25651 Stiffness of right hip, not elsewhere classified: Secondary | ICD-10-CM | POA: Diagnosis not present

## 2020-02-08 DIAGNOSIS — R2689 Other abnormalities of gait and mobility: Secondary | ICD-10-CM | POA: Insufficient documentation

## 2020-02-08 DIAGNOSIS — M25671 Stiffness of right ankle, not elsewhere classified: Secondary | ICD-10-CM | POA: Diagnosis not present

## 2020-02-08 DIAGNOSIS — M25672 Stiffness of left ankle, not elsewhere classified: Secondary | ICD-10-CM | POA: Diagnosis not present

## 2020-02-08 NOTE — Therapy (Signed)
[address] [place], [place], [address] Phone: [phone]   Fax:  [phone]  Physical Therapy Evaluation  Patient Details  Name: Kyle Mcguire MRN: [id number] Date of Birth: 02-Aug-1941 Referring Provider (PT): Dr Sarina Ill   Encounter Date: 02/08/2020  PT End of Session - 02/08/20 0931    Visit Number  1    Number of Visits  12    Date for PT Re-Evaluation  03/21/20    PT Start Time  0931    PT Stop Time  1014    PT Time Calculation (min)  43 min    Activity Tolerance  Patient tolerated treatment well    Behavior During Therapy  [facility] for tasks assessed/performed       Past Medical History:  Diagnosis Date  . Dementia ([place])   . Hearing loss   . Parkinson's disease [facility])     Past Surgical History:  Procedure Laterality Date  . no surgical history per patient    . None      There were no vitals filed for this visit.   Subjective Assessment - 02/08/20 0931    Subjective  Pt reports he has been falling alot over the last month so he asked MD for a referral to PT.  Most of them are going down hill when he is walking - picks up speed and then cant stop. He walks everyday about 1/2 mile by himself. He has been able to get up by himself.    Pertinent History  Parkinsons, orthostatic hypotension    Patient Stated Goals  walk better and not fall.    Currently in Pain?  No/denies         [facility] PT Assessment - 02/08/20 0001      Assessment   Medical Diagnosis  Gait abnormality - Parkinsons    Referring Provider (PT)  Dr Sarina Ill    Onset Date/Surgical Date  01/08/20    Hand Dominance  Right    Next MD Visit  PRN    Prior Therapy  none      Precautions   Precautions  None      Balance Screen   Has the patient fallen in the past 6 months  Yes    How many times?  multiple    Has the patient had a decrease in activity level because of a fear of falling?   No    Is the patient  reluctant to leave their home because of a fear of falling?   No      Home Environment   Living Environment  Private residence    Living Arrangements  Spouse/significant other    Home Access  Level entry    [place]  One level      Prior Function   Level of [place]  Retired    Leisure  walking, other sedentary Warehouse manager Tests   Functional tests  Squat;Single leg stance      Squat   Comments  WNL      Single Leg Stance   Comments  Rt 8 sec, Lt 10 sec       Posture/Postural Control   Posture/Postural Control  Postural limitations    Postural Limitations  Rounded Shoulders;Forward head;Increased thoracic kyphosis      ROM / Strength   AROM / PROM / Strength  AROM;Strength  AROM   AROM Assessment Site  Hip;Ankle;Lumbar    Right/Left Hip  Right;Left    Right Hip Extension  --   limited d/t tight hip flexors   Right Hip External Rotation   30    Right Hip Internal Rotation   18    Left Hip External Rotation   40    Left Hip Internal Rotation   5    Right/Left Ankle  --   DF Rt 9, Lt 5   Lumbar Flexion  to knees    Lumbar - Right Rotation  25% present    Lumbar - Left Rotation  50% present       Strength   Strength Assessment Site  Hip;Knee;Ankle    Right/Left Hip  --   WNL   Right/Left Knee  --   WNL   Right/Left Ankle  --   WNL     Flexibility   Soft Tissue Assessment /Muscle Length  yes   bilat  hip flexors tight    Hamstrings  supine SLR Rt 50, Lt 45    Quadriceps  prone knee flex Rt 80, Lt 78      Standardized Balance Assessment   Standardized Balance Assessment  Timed Up and Go Test;Dynamic Gait Index      Dynamic Gait Index   Level Surface  Normal    Change in Gait Speed  Mild Impairment    Gait with Horizontal Head Turns  Normal    Gait with Vertical Head Turns  Mild Impairment    Gait and Pivot Turn  Normal    Step Over Obstacle  Normal    Step Around Obstacles  Mild Impairment    Steps  Normal     Total Score  21      Timed Up and Go Test   Normal TUG (seconds)  14                Objective measurements completed on examination: See above findings.      [facility] Adult PT Treatment/Exercise - 02/08/20 0001      Balance   Balance Assessed  Yes      Dynamic Standing Balance   Dynamic Standing - Balance Support  No upper extremity supported    Dynamic Standing - Comments  side stepping and BWD gait next to wall for safety.       Exercises   Exercises  Knee/Hip      Knee/Hip Exercises: Stretches   Passive Hamstring Stretch  Both;60 seconds    Passive Hamstring Stretch Limitations  seated    Hip Flexor Stretch  Both;60 seconds    Hip Flexor Stretch Limitations  seated    Gastroc Stretch  Both;60 seconds    Gastroc Stretch Limitations  at wall                  PT Long Term Goals - 02/08/20 1159      PT LONG TERM GOAL #1   Title  I with advanced HEP to include balance and flexibility ( 03/21/2020)    Time  6    Period  Weeks    Status  New    Target Date  03/21/20      PT LONG TERM GOAL #2   Title  improve bilat hip flexibility to WNL to allow him to use hip strategies for balance ( 03/21/2020)    Time  6    Period  Weeks    Status  New  Target Date  03/21/20      PT LONG TERM GOAL #3   Title  increased bilat ankle dorsiflexion =/> 12 to allow him to use ankle strategies for balance ( 03/21/2020)    Time  6    Period  Weeks    Status  New    Target Date  03/21/20      PT LONG TERM GOAL #4   Title  imporve TUG =/< 12 sec to show decreased risk of falls ( 03/21/2020)    Time  6    Period  Weeks    Status  New    Target Date  03/21/20      PT LONG TERM GOAL #5   Title  pt will reports no loss of control of gait speed when ambulating down inclines during his walking ( 03/21/2020)    Time  6    Period  Weeks    Status  New    Target Date  03/21/20             Plan - 02/08/20 1149    Clinical Impression Statement  79 yo male with ~ 1  yr dx of Parkinsons.  He has begun falling over the last month.  The falls are occuring with his walks outside and mostly when he is going down hill , stated he builds up speed and cant' stop.  He is very tight in bilat hips and ankle dorsiflexion.  OVerall strength is good.  He has slight impairement in TUG and DGI.  Most of his difficulty comes with turning and walking out of his normal gait.  He was not assessed on inclines today.  He has not had any therapy since his dx.  Would benefit from PT to address his tightness and gait to prevent further falls and injury.    Personal Factors and Comorbidities  Age;Comorbidity 1    Comorbidities  orthostatic hypotension, Parkinsons dx    Examination-Activity Limitations  Locomotion Level    Stability/Clinical Decision Making  Stable/Uncomplicated    Clinical Decision Making  Low    Rehab Potential  Excellent    PT Frequency  2x / week    PT Duration  6 weeks    PT Treatment/Interventions  Gait training;Stair training;Functional mobility training;Patient/family education;Taping;Therapeutic activities;Passive range of motion;Therapeutic exercise;Balance training;Neuromuscular re-education;Manual techniques;Moist Heat;Cryotherapy;Electrical Stimulation    PT Next Visit Plan  hip flexibility and balance training    Consulted and Agree with Plan of Care  Patient       Patient will benefit from skilled therapeutic intervention in order to improve the following deficits and impairments:  Decreased range of motion, Difficulty walking, Impaired flexibility, Decreased balance  Visit Diagnosis: Other abnormalities of gait and mobility - Plan: PT plan of care cert/re-cert  Stiffness of right hip, not elsewhere classified - Plan: PT plan of care cert/re-cert  Stiffness of left hip, not elsewhere classified - Plan: PT plan of care cert/re-cert  Stiffness of left ankle, not elsewhere classified - Plan: PT plan of care cert/re-cert  Stiffness of right ankle, not  elsewhere classified - Plan: PT plan of care cert/re-cert     Problem List Patient Active Problem List   Diagnosis Date Noted  . 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 Snoring 09/21/2017  . Memory loss 08/26/2017  . 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  . Parkinson's disease ([place]) 12/31/2014    Jeral Pinch PT  02/08/2020, 12:05 PM  [place]  [address] [place], [place], [address] Phone: [phone]   Fax:  [phone]  Name: Kyle Mcguire MRN: [id number] Date of Birth: [birth year]

## 2020-02-08 NOTE — Patient Instructions (Signed)
Access Code: [email or web address]  URL: [email or web address]  Date: 02/08/2020  Prepared by: Jeral Pinch   Exercises Gastroc Stretch on Wall - 2 reps - 45 hold - 1-2x daily Seated Hip Flexor Stretch - 2 reps - 45 hold - 1-2x daily Seated Hamstring Stretch - 2 reps - 45 hold - 1-2x daily Sideways Walking - 10 reps - 1-2x daily Forward and Backward Walking with Half Turns - Slow - 10 reps - 1x daily

## 2020-02-14 ENCOUNTER — Ambulatory Visit: Payer: PPO | Attending: Internal Medicine

## 2020-02-14 DIAGNOSIS — Z23 Encounter for immunization: Secondary | ICD-10-CM | POA: Insufficient documentation

## 2020-02-14 NOTE — Progress Notes (Signed)
   Covid-19 Vaccination Clinic  Name:  Kyle Mcguire    MRN: [id number] DOB: 31-May-1941  02/14/2020  [name] was observed post Covid-19 immunization for 15 minutes without incident. He was provided with Vaccine Information Sheet and instruction to access the V-Safe system.   [name] was instructed to call 911 with any severe reactions post vaccine: [name] Difficulty breathing  . Swelling of face and throat  . A fast heartbeat  . A bad rash all over body  . Dizziness and weakness   Immunizations Administered    Name Date Dose VIS Date Route   Pfizer COVID-19 Vaccine 02/14/2020 11:32 AM 0.3 mL 11/25/2019 Intramuscular   Manufacturer: [place]   Lot: [id number]   [place]: [id number]

## 2020-02-15 ENCOUNTER — Ambulatory Visit: Payer: PPO | Attending: Neurology | Admitting: Physical Therapy

## 2020-02-15 ENCOUNTER — Other Ambulatory Visit: Payer: Self-pay

## 2020-02-15 ENCOUNTER — Encounter: Payer: Self-pay | Admitting: Physical Therapy

## 2020-02-15 DIAGNOSIS — M25672 Stiffness of left ankle, not elsewhere classified: Secondary | ICD-10-CM | POA: Diagnosis not present

## 2020-02-15 DIAGNOSIS — M25671 Stiffness of right ankle, not elsewhere classified: Secondary | ICD-10-CM | POA: Insufficient documentation

## 2020-02-15 DIAGNOSIS — R2689 Other abnormalities of gait and mobility: Secondary | ICD-10-CM | POA: Diagnosis not present

## 2020-02-15 DIAGNOSIS — M25652 Stiffness of left hip, not elsewhere classified: Secondary | ICD-10-CM | POA: Insufficient documentation

## 2020-02-15 DIAGNOSIS — M25651 Stiffness of right hip, not elsewhere classified: Secondary | ICD-10-CM | POA: Insufficient documentation

## 2020-02-15 NOTE — Therapy (Signed)
[address] [place], [place], [address] Phone: [phone]   Fax:  [phone]  Physical Therapy Treatment  Patient Details  Name: Kyle Mcguire MRN: [id number] Date of Birth: [birth year] Referring Provider (PT): Dr Sarina Ill   Encounter Date: 02/15/2020  PT End of Session - 02/15/20 0920    Visit Number  2    Date for PT Re-Evaluation  03/21/20    PT Start Time  0830    PT Stop Time  0919    PT Time Calculation (min)  49 min    Activity Tolerance  Patient tolerated treatment well    Behavior During Therapy  [facility] for tasks assessed/performed       Past Medical History:  Diagnosis Date  . Dementia ([place])   . Hearing loss   . Parkinson's disease [facility])     Past Surgical History:  Procedure Laterality Date  . no surgical history per patient    . None      There were no vitals filed for this visit.  Subjective Assessment - 02/15/20 0832    Subjective  Patient reports no issues with the exercises, no falls                       [facility] Adult PT Treatment/Exercise - 02/15/20 0001      Ambulation/Gait   Gait Comments  went outside and had him walk down a slope, some cues for controlled slow, he did well.      High Level Balance   High Level Balance Activities  Side stepping;Backward walking;Negotiating over obstacles;Direction changes    High Level Balance Comments  ball toss, ball kicks, walking ball toss      Knee/Hip Exercises: Stretches   Passive Hamstring Stretch  Both;60 seconds    Passive Hamstring Stretch Limitations  seated    Gastroc Stretch  Both;20 seconds;3 reps      Knee/Hip Exercises: Aerobic   Nustep  Level 4 x 6 minutes      Knee/Hip Exercises: Machines for Strengthening   Cybex Knee Extension  5# 2x10 needed a lot of cues to get full TKE and to go slow and control the motions    Cybex Knee Flexion  25# 2x10    Cybex Leg Press  30# 2x10                   PT Long Term Goals - 02/15/20 0277      PT LONG TERM GOAL #1   Title  I with advanced HEP to include balance and flexibility ( 03/21/2020)    Status  Partially Met            Plan - 02/15/20 0920    Clinical Impression Statement  Patient overall did very well with the exercises and the balance, his biggest issue with the exercises was needing cues to go slow and get full ROM, his balance issues seemed to mostly be with turning, he would shuffle at these times, he lost his balance once with walking and tossing ball    PT Next Visit Plan  hip flexibility and balance training    Consulted and Agree with Plan of Care  Patient       Patient will benefit from skilled therapeutic intervention in order to improve the following deficits and impairments:  Decreased range of motion, Difficulty walking, Impaired flexibility, Decreased balance  Visit Diagnosis: Other abnormalities of gait  and mobility  Stiffness of right hip, not elsewhere classified  Stiffness of left hip, not elsewhere classified  Stiffness of left ankle, not elsewhere classified  Stiffness of right ankle, not elsewhere classified     Problem List Patient Active Problem List   Diagnosis Date Noted  . 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 Snoring 09/21/2017  . Memory loss 08/26/2017  . 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  . Parkinson's disease ([place]) 12/31/2014    Sumner Boast., PT 02/15/2020, 9:22 AM  [address] [place], [place], [address] Phone: [phone]   Fax:  [phone]  Name: Kyle Mcguire MRN: [id number] Date of Birth: Jun 06, 1941

## 2020-02-17 ENCOUNTER — Ambulatory Visit: Payer: PPO | Admitting: Physical Therapy

## 2020-02-17 ENCOUNTER — Other Ambulatory Visit: Payer: Self-pay

## 2020-02-17 ENCOUNTER — Encounter: Payer: Self-pay | Admitting: Physical Therapy

## 2020-02-17 DIAGNOSIS — R2689 Other abnormalities of gait and mobility: Secondary | ICD-10-CM | POA: Diagnosis not present

## 2020-02-17 DIAGNOSIS — M25672 Stiffness of left ankle, not elsewhere classified: Secondary | ICD-10-CM

## 2020-02-17 DIAGNOSIS — M25671 Stiffness of right ankle, not elsewhere classified: Secondary | ICD-10-CM

## 2020-02-17 DIAGNOSIS — M25652 Stiffness of left hip, not elsewhere classified: Secondary | ICD-10-CM

## 2020-02-17 DIAGNOSIS — M25651 Stiffness of right hip, not elsewhere classified: Secondary | ICD-10-CM

## 2020-02-17 NOTE — Therapy (Signed)
[address] [address], [place], [address] Phone: [phone]   Fax:  [phone]  Physical Therapy Treatment  Patient Details  Name: Kyle Mcguire MRN: [id number] Date of Birth: 1941-10-10 Referring Provider (PT): Dr Sarina Ill   Encounter Date: 02/17/2020  PT End of Session - 02/17/20 1011    Visit Number  3    Date for PT Re-Evaluation  03/21/20    PT Start Time  0930    PT Stop Time  1010    PT Time Calculation (min)  40 min    Activity Tolerance  Patient tolerated treatment well    Behavior During Therapy  [facility] for tasks assessed/performed       Past Medical History:  Diagnosis Date  . Dementia ([place])   . Hearing loss   . Parkinson's disease [facility])     Past Surgical History:  Procedure Laterality Date  . no surgical history per patient    . None      There were no vitals filed for this visit.  Subjective Assessment - 02/17/20 0930    Subjective  Dizzy this morning. Pt reports that he has a fall this morning walking into therapy. Had help getting up. Reports no pain or injuries.    Currently in Pain?  No/denies                       [facility] Adult PT Treatment/Exercise - 02/17/20 0001      High Level Balance   High Level Balance Activities  Side stepping;Backward walking;Direction changes;Figure 8 turns;Negotitating around obstacles;Negotiating over obstacles    High Level Balance Comments  curb negotiation      Knee/Hip Exercises: Aerobic   Nustep  Level 4 x 6 minutes      Knee/Hip Exercises: Machines for Strengthening   Cybex Knee Extension  5# 2x10 needed a lot of cues to get full TKE and to go slow and control the motions    Cybex Knee Flexion  25# 2x10    Cybex Leg Press  30# 2x10                  PT Long Term Goals - 02/15/20 3220      PT LONG TERM GOAL #1   Title  I with advanced HEP to include balance and flexibility ( 03/21/2020)    Status   Partially Met            Plan - 02/17/20 1011    Clinical Impression Statement  Pt reports a fall in the parking lot on hi sway into therapy session. He reports falling over curb. No injuries or pain reported. He does well in a straight line but has difficulty changing directions. Some shuffling steps with figure 8 turns. Some decrease anterior lean with curb negotiation    Personal Factors and Comorbidities  Age;Comorbidity 1    Comorbidities  orthostatic hypotension, Parkinsons dx    Examination-Activity Limitations  Locomotion Level    Stability/Clinical Decision Making  Stable/Uncomplicated    Rehab Potential  Excellent    PT Treatment/Interventions  Gait training;Stair training;Functional mobility training;Patient/family education;Taping;Therapeutic activities;Passive range of motion;Therapeutic exercise;Balance training;Neuromuscular re-education;Manual techniques;Moist Heat;Cryotherapy;Electrical Stimulation    PT Next Visit Plan  hip flexibility and balance training       Patient will benefit from skilled therapeutic intervention in order to improve the following deficits and impairments:  Decreased range of motion, Difficulty walking, Impaired  flexibility, Decreased balance  Visit Diagnosis: Other abnormalities of gait and mobility  Stiffness of right hip, not elsewhere classified  Stiffness of left hip, not elsewhere classified  Stiffness of left ankle, not elsewhere classified  Stiffness of right ankle, not elsewhere classified     Problem List Patient Active Problem List   Diagnosis Date Noted  . 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 Snoring 09/21/2017  . Memory loss 08/26/2017  . 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  . Parkinson's disease ([place]) 12/31/2014    Scot Jun, PTA 02/17/2020, 10:24 AM  [address]  [place], [place], [address] Phone: [phone]   Fax:  [phone]  Name: JOSEMARIA BRINING MRN: [id number] Date of Birth: 1941-09-02

## 2020-02-22 ENCOUNTER — Encounter: Payer: Self-pay | Admitting: Physical Therapy

## 2020-02-22 ENCOUNTER — Ambulatory Visit: Payer: PPO | Admitting: Physical Therapy

## 2020-02-22 ENCOUNTER — Other Ambulatory Visit: Payer: Self-pay

## 2020-02-22 DIAGNOSIS — R2689 Other abnormalities of gait and mobility: Secondary | ICD-10-CM | POA: Diagnosis not present

## 2020-02-22 DIAGNOSIS — M25652 Stiffness of left hip, not elsewhere classified: Secondary | ICD-10-CM

## 2020-02-22 DIAGNOSIS — M25672 Stiffness of left ankle, not elsewhere classified: Secondary | ICD-10-CM

## 2020-02-22 DIAGNOSIS — M25651 Stiffness of right hip, not elsewhere classified: Secondary | ICD-10-CM

## 2020-02-22 NOTE — Therapy (Signed)
[place] [address] [place], [place], [address] Phone: [phone]   Fax:  [phone]  Physical Therapy Treatment  Patient Details  Name: Kyle Mcguire MRN: [id number] Date of Birth: Dec 06, 1941 Referring Provider (PT): Dr Sarina Ill   Encounter Date: 02/22/2020  PT End of Session - 02/22/20 1011    Visit Number  4    Date for PT Re-Evaluation  03/21/20    PT Start Time  0930    PT Stop Time  1013    PT Time Calculation (min)  43 min    Activity Tolerance  Patient tolerated treatment well    Behavior During Therapy  [facility] for tasks assessed/performed       Past Medical History:  Diagnosis Date  . Dementia ([place])   . Hearing loss   . Parkinson's disease [facility])     Past Surgical History:  Procedure Laterality Date  . no surgical history per patient    . None      There were no vitals filed for this visit.  Subjective Assessment - 02/22/20 0932    Subjective  Pt reports that he is doing good. No falls    Currently in Pain?  No/denies         [facility] PT Assessment - 02/22/20 0001      Dynamic Gait Index   Level Surface  Normal    Change in Gait Speed  Mild Impairment    Gait with Horizontal Head Turns  Normal    Gait with Vertical Head Turns  Normal    Gait and Pivot Turn  Normal    Step Over Obstacle  Normal    Step Around Obstacles  Mild Impairment    Steps  Normal    Total Score  22      Timed Up and Go Test   Normal TUG (seconds)  10.23                   [facility] Adult PT Treatment/Exercise - 02/22/20 0001      Ambulation/Gait   Stairs  Yes    Stairs Assistance  5: Supervision;6: Modified independent (Device/Increase time)    Stair Management Technique  One rail Right;Alternating pattern    Number of Stairs  48    Height of Stairs  6    Gait Comments  does resort to step to pattern, can correct with cues. Pt did miss the last step going down on the first step able to  matain balance with one rail on hand      Knee/Hip Exercises: Stretches   Passive Hamstring Stretch  Both;4 reps;20 seconds      Knee/Hip Exercises: Aerobic   Nustep  Level 4 x 6 minutes      Knee/Hip Exercises: Machines for Strengthening   Cybex Leg Press  30# 2x10      Knee/Hip Exercises: Standing   Other Standing Knee Exercises  Alt box taps 4 and 6 inch x 10       Resisted side step 30lb x 3 each difficulty during the eccentric phase             PT Long Term Goals - 02/22/20 0946      PT LONG TERM GOAL #4   Title  imporve TUG =/< 12 sec to show decreased risk of falls ( 03/21/2020)    Status  Achieved            Plan -  02/22/20 1012    Clinical Impression Statement  Pt has progressed meeting TUG goal. He has also increased his DGI slightly. He continues to do well in a straight line but some unsteadiness with turns. Cues to slow down and concentrate with alternating box taps. Instability noted with resisted side steps. Bilateral HS tightness noted with stretching.    Personal Factors and Comorbidities  Age;Comorbidity 1    Comorbidities  orthostatic hypotension, Parkinsons dx    Stability/Clinical Decision Making  Stable/Uncomplicated    Rehab Potential  Excellent    PT Frequency  2x / week    PT Treatment/Interventions  Gait training;Stair training;Functional mobility training;Patient/family education;Taping;Therapeutic activities;Passive range of motion;Therapeutic exercise;Balance training;Neuromuscular re-education;Manual techniques;Moist Heat;Cryotherapy;Electrical Stimulation    PT Next Visit Plan  hip flexibility and balance training       Patient will benefit from skilled therapeutic intervention in order to improve the following deficits and impairments:  Decreased range of motion, Difficulty walking, Impaired flexibility, Decreased balance  Visit Diagnosis: Other abnormalities of gait and mobility  Stiffness of right hip, not elsewhere  classified  Stiffness of left hip, not elsewhere classified  Stiffness of left ankle, not elsewhere classified     Problem List Patient Active Problem List   Diagnosis Date Noted  . 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 Snoring 09/21/2017  . Memory loss 08/26/2017  . 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  . Parkinson's disease ([place]) 12/31/2014    Scot Jun, PTA 02/22/2020, 10:14 AM  [place], [place], [address] Phone: [phone]   Fax:  [phone]  Name: Kyle Mcguire MRN: [id number] Date of Birth: [birth year]

## 2020-02-24 ENCOUNTER — Other Ambulatory Visit: Payer: Self-pay

## 2020-02-24 ENCOUNTER — Ambulatory Visit: Payer: PPO | Admitting: Physical Therapy

## 2020-02-24 DIAGNOSIS — R2689 Other abnormalities of gait and mobility: Secondary | ICD-10-CM | POA: Diagnosis not present

## 2020-02-24 DIAGNOSIS — M25652 Stiffness of left hip, not elsewhere classified: Secondary | ICD-10-CM

## 2020-02-24 DIAGNOSIS — M25651 Stiffness of right hip, not elsewhere classified: Secondary | ICD-10-CM

## 2020-02-24 NOTE — Therapy (Signed)
[address] [place], [place], [address] Phone: [phone]   Fax:  [phone]  Physical Therapy Treatment  Patient Details  Name: Kyle Mcguire MRN: [id number] Date of Birth: [birth year] Referring Provider (PT): Dr Sarina Ill   Encounter Date: 02/24/2020  PT End of Session - 02/24/20 1007    Visit Number  5    Number of Visits  12    Date for PT Re-Evaluation  03/21/20    PT Start Time  0930    PT Stop Time  1010    PT Time Calculation (min)  40 min       Past Medical History:  Diagnosis Date  . Dementia ([place])   . Hearing loss   . Parkinson's disease [facility])     Past Surgical History:  Procedure Laterality Date  . no surgical history per patient    . None      There were no vitals filed for this visit.  Subjective Assessment - 02/24/20 0936    Subjective  doing okay- denies falls. pt amb in with short shuffled gait pattern    Currently in Pain?  No/denies                       [facility] Adult PT Treatment/Exercise - 02/24/20 0001      Dynamic Standing Balance   Dynamic Standing - Comments  worked on gait taking increased stride- asked pt to work on this and explained it will take him really reminding himslef      High Level Balance   High Level Balance Activities  Negotitating around obstacles;Negotiating over obstacles    High Level Balance Comments  ball toss on airex- posterior LOB min A needed, decreased righting reactions   agility ladder to increase stride     Knee/Hip Exercises: Aerobic   Nustep  Level 5 x 6 minutes      Knee/Hip Exercises: Machines for Strengthening   Cybex Knee Extension  5# 2x10 needed a lot of cues to get full TKE and to go slow and control the motions    Cybex Knee Flexion  25# 2x10   cuing needed   Cybex Leg Press  30# 2x10      Knee/Hip Exercises: Seated   Sit to Sand  20 reps;without UE support   wt ball 10x, on airex 10x                  PT Long Term Goals - 02/22/20 0946      PT LONG TERM GOAL #4   Title  imporve TUG =/< 12 sec to show decreased risk of falls ( 03/21/2020)    Status  Achieved            Plan - 02/24/20 1008    Clinical Impression Statement  pt with short shuffled steps so worked on increasing stride with gait and asked pt to really concentrate on this over the weekend. pt had several episodes during tx of poor saftey and tried ot educ on moving slwer and being safer. decreased motor processing noted. pt needed modaerate cuing with all activities and min A with higher level balance activities with several posterior LOB    PT Treatment/Interventions  Gait training;Stair training;Functional mobility training;Patient/family education;Taping;Therapeutic activities;Passive range of motion;Therapeutic exercise;Balance training;Neuromuscular re-education;Manual techniques;Moist Heat;Cryotherapy;Electrical Stimulation    PT Next Visit Plan  hip flexibility and balance training  Patient will benefit from skilled therapeutic intervention in order to improve the following deficits and impairments:  Decreased range of motion, Difficulty walking, Impaired flexibility, Decreased balance  Visit Diagnosis: Other abnormalities of gait and mobility  Stiffness of right hip, not elsewhere classified  Stiffness of left hip, not elsewhere classified     Problem List Patient Active Problem List   Diagnosis Date Noted  . 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 Snoring 09/21/2017  . Memory loss 08/26/2017  . 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  . Parkinson's disease ([place]) 12/31/2014    Katura Eatherly,ANGIE PTA 02/24/2020, 10:10 AM  [address] [place], [place], [address] Phone: [phone]   Fax:  [phone]  Name: Kyle Mcguire MRN: [id number] Date of Birth:  09-10-1941

## 2020-02-29 ENCOUNTER — Ambulatory Visit: Payer: PPO | Admitting: Physical Therapy

## 2020-02-29 ENCOUNTER — Encounter: Payer: Self-pay | Admitting: Physical Therapy

## 2020-02-29 ENCOUNTER — Other Ambulatory Visit: Payer: Self-pay

## 2020-02-29 DIAGNOSIS — M25671 Stiffness of right ankle, not elsewhere classified: Secondary | ICD-10-CM

## 2020-02-29 DIAGNOSIS — M25672 Stiffness of left ankle, not elsewhere classified: Secondary | ICD-10-CM

## 2020-02-29 DIAGNOSIS — R2689 Other abnormalities of gait and mobility: Secondary | ICD-10-CM

## 2020-02-29 DIAGNOSIS — M25652 Stiffness of left hip, not elsewhere classified: Secondary | ICD-10-CM

## 2020-02-29 DIAGNOSIS — M25651 Stiffness of right hip, not elsewhere classified: Secondary | ICD-10-CM

## 2020-02-29 NOTE — Therapy (Signed)
[address] [place], [place], [address] Phone: [phone]   Fax:  [phone]  Physical Therapy Treatment  Patient Details  Name: Kyle Mcguire MRN: [id number] Date of Birth: Feb 10, 1941 Referring Provider (PT): Dr Sarina Ill   Encounter Date: 02/29/2020  PT End of Session - 02/29/20 0924    Visit Number  6    Number of Visits  12    Date for PT Re-Evaluation  03/21/20    PT Start Time  0925    PT Stop Time  1010    PT Time Calculation (min)  45 min    Activity Tolerance  Patient tolerated treatment well    Behavior During Therapy  [facility] for tasks assessed/performed       Past Medical History:  Diagnosis Date  . Dementia ([place])   . Hearing loss   . Parkinson's disease [facility])     Past Surgical History:  Procedure Laterality Date  . no surgical history per patient    . None      There were no vitals filed for this visit.  Subjective Assessment - 02/29/20 0925    Subjective  Pt reports no falls and he feels like he is making progress.  Reports not feeling as shaky as he used to be.    Patient Stated Goals  walk better and not fall.         [facility] PT Assessment - 02/29/20 0001      AROM   Right/Left Ankle  --   Lt 9, rt 12                  [facility] Adult PT Treatment/Exercise - 02/29/20 0001      Knee/Hip Exercises: Stretches   Passive Hamstring Stretch  Both;2 reps;30 seconds   seated   Piriformis Stretch  Both;2 reps;30 seconds    Piriformis Stretch Limitations  modified figure 4 - foot on 8" step     Gastroc Stretch  Both;2 reps;30 seconds   at wall    Other Knee/Hip Stretches  seated hip circles with UE assist each direction, 10 reps shoulders and upper back stretching bringing dowel up and behind head.       Knee/Hip Exercises: Aerobic   Nustep  Level 5 x 6 minutes      Knee/Hip Exercises: Machines for Strengthening   Cybex Leg Press  40# 2x10, ball between knees     Other Machine  lat pull down and seated rows, 20# 2x10      Knee/Hip Exercises: Standing   Other Standing Knee Exercises  high kneel on edge of mat chopping holding yellow ball  then with  one foot on mat and one on floor      Knee/Hip Exercises: Seated   Other Seated Knee/Hip Exercises  core work - rotation upper body holding yellow ball.     Sit to Sand  without UE support   3x10 holding yellow wt'ed ball                 PT Long Term Goals - 02/29/20 1003      PT LONG TERM GOAL #1   Title  I with advanced HEP to include balance and flexibility ( 03/21/2020)    Status  Partially Met      PT LONG TERM GOAL #2   Title  improve bilat hip flexibility to WNL to allow him to use hip strategies for balance (  03/21/2020)    Baseline  still very tight in hips    Status  On-going      PT LONG TERM GOAL #3   Title  increased bilat ankle dorsiflexion =/> 12 to allow him to use ankle strategies for balance ( 03/21/2020)    Baseline  Lt 9, R t12    Status  Partially Met      PT LONG TERM GOAL #4   Title  imporve TUG =/< 12 sec to show decreased risk of falls ( 03/21/2020)    Status  Achieved      PT LONG TERM GOAL #5   Title  pt will reports no loss of control of gait speed when ambulating down inclines during his walking ( 03/21/2020)    Baseline  Pt reports he is having no problems walking down hill now, has not falled or loss his balance lately    Status  Achieved            Plan - 02/29/20 1011    Clinical Impression Statement  Pt is making progress to his goals, partially met.  He is still very stiff in his hips, improved ankle DF.  He would benefit from continued treatment to improve hip mobility, strength and balance    Rehab Potential  Excellent    PT Frequency  2x / week    PT Duration  6 weeks    PT Treatment/Interventions  Gait training;Stair training;Functional mobility training;Patient/family education;Taping;Therapeutic activities;Passive range of  motion;Therapeutic exercise;Balance training;Neuromuscular re-education;Manual techniques;Moist Heat;Cryotherapy;Electrical Stimulation    PT Next Visit Plan  hip flexibility and balance training    Consulted and Agree with Plan of Care  Patient       Patient will benefit from skilled therapeutic intervention in order to improve the following deficits and impairments:  Decreased range of motion, Difficulty walking, Impaired flexibility, Decreased balance  Visit Diagnosis: Other abnormalities of gait and mobility  Stiffness of right hip, not elsewhere classified  Stiffness of left hip, not elsewhere classified  Stiffness of left ankle, not elsewhere classified  Stiffness of right ankle, not elsewhere classified     Problem List Patient Active Problem List   Diagnosis Date Noted  . 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 Snoring 09/21/2017  . Memory loss 08/26/2017  . 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  . Parkinson's disease ([place]) 12/31/2014    Boneta Lucks RPT  02/29/2020, 10:12 AM  [address] [place], [place], [address] Phone: [phone]   Fax:  [phone]  Name: Kyle Mcguire MRN: [id number] Date of Birth: 01/05/1941

## 2020-03-02 ENCOUNTER — Encounter: Payer: Self-pay | Admitting: Physical Therapy

## 2020-03-02 ENCOUNTER — Other Ambulatory Visit: Payer: Self-pay

## 2020-03-02 ENCOUNTER — Ambulatory Visit: Payer: PPO | Admitting: Physical Therapy

## 2020-03-02 DIAGNOSIS — R2689 Other abnormalities of gait and mobility: Secondary | ICD-10-CM

## 2020-03-02 DIAGNOSIS — M25672 Stiffness of left ankle, not elsewhere classified: Secondary | ICD-10-CM

## 2020-03-02 DIAGNOSIS — M25652 Stiffness of left hip, not elsewhere classified: Secondary | ICD-10-CM

## 2020-03-02 DIAGNOSIS — M25671 Stiffness of right ankle, not elsewhere classified: Secondary | ICD-10-CM

## 2020-03-02 DIAGNOSIS — M25651 Stiffness of right hip, not elsewhere classified: Secondary | ICD-10-CM

## 2020-03-02 NOTE — Therapy (Signed)
[address] [place], [place], [address] Phone: [phone]   Fax:  [phone]  Physical Therapy Treatment  Patient Details  Name: Kyle Mcguire MRN: [id number] Date of Birth: 1941/09/25 Referring Provider (PT): Dr Sarina Ill   Encounter Date: 03/02/2020  PT End of Session - 03/02/20 1005    Visit Number  7    Date for PT Re-Evaluation  03/21/20    PT Start Time  0928    PT Stop Time  1005    PT Time Calculation (min)  37 min       Past Medical History:  Diagnosis Date  . Dementia ([place])   . Hearing loss   . Parkinson's disease [facility])     Past Surgical History:  Procedure Laterality Date  . no surgical history per patient    . None      There were no vitals filed for this visit.  Subjective Assessment - 03/02/20 0928    Subjective  "Good" no falls or stumbles    Currently in Pain?  No/denies                       [facility] Adult PT Treatment/Exercise - 03/02/20 0001      Ambulation/Gait   Stairs  Yes    Stairs Assistance  5: Supervision;6: Modified independent (Device/Increase time)    Stair Management Technique  One rail Right;Alternating pattern    Number of Stairs  48    Height of Stairs  6    Gait Comments  heavy posterior lean when descending LOB x1 mod assist to regain balance      Knee/Hip Exercises: Stretches   Passive Hamstring Stretch  Both;4 reps;10 seconds    Piriformis Stretch  Both;2 reps;30 seconds    Other Knee/Hip Stretches  single knee to chest stretch      Knee/Hip Exercises: Aerobic   Nustep  Level 5 x 7 minutes      Knee/Hip Exercises: Machines for Strengthening   Other Machine  lat pull down and seated rows, 20# 2x10      Knee/Hip Exercises: Standing   Other Standing Knee Exercises  Alt box taps 4 and 6 inch x 10     Other Standing Knee Exercises  Overhead back Ext yellow ball 2x10       Knee/Hip Exercises: Seated   Sit to Sand  10 reps;2  sets;without UE support   from blue chair holding yellow ball      Resisted gait forward and backwards x 3 each 30LB, cue needed to increase stp length during backwards walking.             PT Long Term Goals - 02/29/20 1003      PT LONG TERM GOAL #1   Title  I with advanced HEP to include balance and flexibility ( 03/21/2020)    Status  Partially Met      PT LONG TERM GOAL #2   Title  improve bilat hip flexibility to WNL to allow him to use hip strategies for balance ( 03/21/2020)    Baseline  still very tight in hips    Status  On-going      PT LONG TERM GOAL #3   Title  increased bilat ankle dorsiflexion =/> 12 to allow him to use ankle strategies for balance ( 03/21/2020)    Baseline  Lt 9, R t12    Status  Partially  Met      PT LONG TERM GOAL #4   Title  imporve TUG =/< 12 sec to show decreased risk of falls ( 03/21/2020)    Status  Achieved      PT LONG TERM GOAL #5   Title  pt will reports no loss of control of gait speed when ambulating down inclines during his walking ( 03/21/2020)    Baseline  Pt reports he is having no problems walking down hill now, has not falled or loss his balance lately    Status  Achieved            Plan - 03/02/20 1006    Clinical Impression Statement  Pt remains very stiff in the hips noted with passive stretching. Some posterior LOB when descending stairs mod assist needed to correct. Cues throughout to show down and concentrate. Often times pt will start out exercises well but with start doing them fast.    Personal Factors and Comorbidities  Age;Comorbidity 1    Comorbidities  orthostatic hypotension, Parkinsons dx    Stability/Clinical Decision Making  Stable/Uncomplicated    Rehab Potential  Excellent    PT Frequency  2x / week    PT Duration  6 weeks    PT Treatment/Interventions  Gait training;Stair training;Functional mobility training;Patient/family education;Taping;Therapeutic activities;Passive range of motion;Therapeutic  exercise;Balance training;Neuromuscular re-education;Manual techniques;Moist Heat;Cryotherapy;Electrical Stimulation    PT Next Visit Plan  hip flexibility and balance training       Patient will benefit from skilled therapeutic intervention in order to improve the following deficits and impairments:  Decreased range of motion, Difficulty walking, Impaired flexibility, Decreased balance  Visit Diagnosis: Other abnormalities of gait and mobility  Stiffness of right hip, not elsewhere classified  Stiffness of left ankle, not elsewhere classified  Stiffness of left hip, not elsewhere classified  Stiffness of right ankle, not elsewhere classified     Problem List Patient Active Problem List   Diagnosis Date Noted  . 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 Snoring 09/21/2017  . Memory loss 08/26/2017  . 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  . Parkinson's disease ([place]) 12/31/2014    Scot Jun 03/02/2020, 10:08 AM  [address] [place], [place], [address] Phone: [phone]   Fax:  [phone]  Name: Kyle Mcguire MRN: [id number] Date of Birth: 13-Mar-1941

## 2020-03-07 ENCOUNTER — Ambulatory Visit: Payer: PPO | Admitting: Physical Therapy

## 2020-03-07 ENCOUNTER — Other Ambulatory Visit: Payer: Self-pay

## 2020-03-07 ENCOUNTER — Encounter: Payer: Self-pay | Admitting: Physical Therapy

## 2020-03-07 DIAGNOSIS — R2689 Other abnormalities of gait and mobility: Secondary | ICD-10-CM

## 2020-03-07 DIAGNOSIS — M25651 Stiffness of right hip, not elsewhere classified: Secondary | ICD-10-CM

## 2020-03-07 NOTE — Therapy (Signed)
[address] [place], [place], [address] Phone: [phone]   Fax:  [phone]  Physical Therapy Treatment  Patient Details  Name: YOSHITO GAZA MRN: [id number] Date of Birth: 1941-04-17 Referring Provider (PT): Dr Sarina Ill   Encounter Date: 03/07/2020  PT End of Session - 03/07/20 0948    Visit Number  8    Number of Visits  12    Date for PT Re-Evaluation  03/21/20    PT Start Time  0930    PT Stop Time  0951    PT Time Calculation (min)  21 min    Activity Tolerance  Patient tolerated treatment well    Behavior During Therapy  [facility] for tasks assessed/performed       Past Medical History:  Diagnosis Date  . Dementia ([place])   . Hearing loss   . Parkinson's disease [facility])     Past Surgical History:  Procedure Laterality Date  . no surgical history per patient    . None      There were no vitals filed for this visit.  Subjective Assessment - 03/07/20 0934    Subjective  No falls, feeling dizzy today. Had breakfast.    Currently in Pain?  No/denies                       [facility] Adult PT Treatment/Exercise - 03/07/20 0001      Knee/Hip Exercises: Aerobic   Nustep  Level 5 x 7 minutes      Knee/Hip Exercises: Machines for Strengthening   Cybex Leg Press  30lb 2x10     Other Machine  lat pull down and seated rows, 20# 2x10                  PT Long Term Goals - 02/29/20 1003      PT LONG TERM GOAL #1   Title  I with advanced HEP to include balance and flexibility ( 03/21/2020)    Status  Partially Met      PT LONG TERM GOAL #2   Title  improve bilat hip flexibility to WNL to allow him to use hip strategies for balance ( 03/21/2020)    Baseline  still very tight in hips    Status  On-going      PT LONG TERM GOAL #3   Title  increased bilat ankle dorsiflexion =/> 12 to allow him to use ankle strategies for balance ( 03/21/2020)    Baseline  Lt 9, R t12    Status  Partially Met      PT LONG TERM GOAL #4   Title  imporve TUG =/< 12 sec to show decreased risk of falls ( 03/21/2020)    Status  Achieved      PT LONG TERM GOAL #5   Title  pt will reports no loss of control of gait speed when ambulating down inclines during his walking ( 03/21/2020)    Baseline  Pt reports he is having no problems walking down hill now, has not falled or loss his balance lately    Status  Achieved            Plan - 03/07/20 0949    Clinical Impression Statement  Pt enters clinic reporting that he was dizzy but wanted to participate in therapy. He was able to complete aerobic warm up, seated rows, and seated lat pull downs before he said  he wasn't feeling well. Discontinued therapy, pt declined water, walked pt about to his car. Reports not being diabetic. He report having these spells before    Personal Factors and Comorbidities  Age;Comorbidity 1    Comorbidities  orthostatic hypotension, Parkinsons dx    Examination-Activity Limitations  Locomotion Level    Stability/Clinical Decision Making  Stable/Uncomplicated    Rehab Potential  Excellent    PT Frequency  2x / week    PT Treatment/Interventions  Gait training;Stair training;Functional mobility training;Patient/family education;Taping;Therapeutic activities;Passive range of motion;Therapeutic exercise;Balance training;Neuromuscular re-education;Manual techniques;Moist Heat;Cryotherapy;Electrical Stimulation    PT Next Visit Plan  assess tx,hip flexibility and balance training       Patient will benefit from skilled therapeutic intervention in order to improve the following deficits and impairments:  Decreased range of motion, Difficulty walking, Impaired flexibility, Decreased balance  Visit Diagnosis: Other abnormalities of gait and mobility  Stiffness of right hip, not elsewhere classified     Problem List Patient Active Problem List   Diagnosis Date Noted  . 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 Snoring 09/21/2017  . Memory loss 08/26/2017  . 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  . Parkinson's disease ([place]) 12/31/2014    Scot Jun, PTA 03/07/2020, 9:58 AM  [address] [place], [place], [address] Phone: [phone]   Fax:  [phone]  Name: ARCANGEL MINION MRN: [id number] Date of Birth: 04/12/1941

## 2020-03-09 ENCOUNTER — Encounter: Payer: Self-pay | Admitting: Physical Therapy

## 2020-03-09 ENCOUNTER — Ambulatory Visit: Payer: PPO | Admitting: Physical Therapy

## 2020-03-09 ENCOUNTER — Other Ambulatory Visit: Payer: Self-pay

## 2020-03-09 DIAGNOSIS — R2689 Other abnormalities of gait and mobility: Secondary | ICD-10-CM

## 2020-03-09 DIAGNOSIS — M25672 Stiffness of left ankle, not elsewhere classified: Secondary | ICD-10-CM

## 2020-03-09 DIAGNOSIS — M25651 Stiffness of right hip, not elsewhere classified: Secondary | ICD-10-CM

## 2020-03-09 DIAGNOSIS — M25652 Stiffness of left hip, not elsewhere classified: Secondary | ICD-10-CM

## 2020-03-09 NOTE — Therapy (Signed)
[address] [place], [place], [address] Phone: [phone]   Fax:  [phone]  Physical Therapy Treatment  Patient Details  Name: Kyle Mcguire MRN: [id number] Date of Birth: [birth year] Referring Provider (PT): Dr Sarina Ill   Encounter Date: 03/09/2020  PT End of Session - 03/09/20 1008    Visit Number  9    Number of Visits  12    Date for PT Re-Evaluation  03/21/20    PT Start Time  0925    PT Stop Time  1008    PT Time Calculation (min)  43 min    Activity Tolerance  Patient tolerated treatment well    Behavior During Therapy  [facility] for tasks assessed/performed       Past Medical History:  Diagnosis Date  . Dementia ([place])   . Hearing loss   . Parkinson's disease [facility])     Past Surgical History:  Procedure Laterality Date  . no surgical history per patient    . None      There were no vitals filed for this visit.  Subjective Assessment - 03/09/20 1015    Subjective  Better    Currently in Pain?  No/denies                       [facility] Adult PT Treatment/Exercise - 03/09/20 0001      Knee/Hip Exercises: Aerobic   Recumbent Bike  L0 x 3 min     Nustep  Level 5 x 7 minutes      Knee/Hip Exercises: Machines for Strengthening   Cybex Knee Extension  5# 2x10 needed a lot of cues to get full TKE and to go slow and control the motions    Cybex Knee Flexion  25# 2x10    Other Machine  lat pull 20lb  and seated rows 25# 2x10      Knee/Hip Exercises: Standing   Other Standing Knee Exercises  Resisted backwards amb with eccentric control 30lb x5, 20lb x5     Other Standing Knee Exercises  6 inch step ups x 5 each.                  PT Long Term Goals - 03/09/20 [id number]      PT LONG TERM GOAL #3   Title  increased bilat ankle dorsiflexion =/> 12 to allow him to use ankle strategies for balance ( 03/21/2020)    Baseline  Lt 12, R t15    Status  Achieved             Plan - 03/09/20 1008    Clinical Impression Statement  Pt enters clinic reporting that he feels a lot better today. He has progressed increasing his bilateral ankle dorsi flexion ROM. No reports of pain. Cues needed with the exercises to complete the full available ROM. Cues needed to increase step length with RLE with resisted backwards walking. Used the resistance to the eccentric phase of backwards walking to simulate down hill ambulation.    Personal Factors and Comorbidities  Age;Comorbidity 1    Comorbidities  orthostatic hypotension, Parkinsons dx    Examination-Activity Limitations  Locomotion Level    Stability/Clinical Decision Making  Stable/Uncomplicated    Rehab Potential  Excellent    PT Frequency  2x / week    PT Duration  6 weeks    PT Next Visit Plan  hip flexibility and balance training  Patient will benefit from skilled therapeutic intervention in order to improve the following deficits and impairments:  Decreased range of motion, Difficulty walking, Impaired flexibility, Decreased balance  Visit Diagnosis: Stiffness of right hip, not elsewhere classified  Stiffness of left ankle, not elsewhere classified  Other abnormalities of gait and mobility  Stiffness of left hip, not elsewhere classified     Problem List Patient Active Problem List   Diagnosis Date Noted  . 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 Snoring 09/21/2017  . Memory loss 08/26/2017  . 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  . Parkinson's disease ([place]) 12/31/2014    Scot Jun, PTA 03/09/2020, 10:15 AM  [address] [place], [place], [address] Phone: [phone]   Fax:  [phone]  Name: Kyle Mcguire MRN: [id number] Date of Birth: 31-Dec-1940

## 2020-03-14 ENCOUNTER — Encounter: Payer: Self-pay | Admitting: Physical Therapy

## 2020-03-14 ENCOUNTER — Ambulatory Visit: Payer: PPO | Admitting: Physical Therapy

## 2020-03-14 ENCOUNTER — Other Ambulatory Visit: Payer: Self-pay

## 2020-03-14 DIAGNOSIS — M25671 Stiffness of right ankle, not elsewhere classified: Secondary | ICD-10-CM

## 2020-03-14 DIAGNOSIS — R2689 Other abnormalities of gait and mobility: Secondary | ICD-10-CM | POA: Diagnosis not present

## 2020-03-14 DIAGNOSIS — M25652 Stiffness of left hip, not elsewhere classified: Secondary | ICD-10-CM

## 2020-03-14 DIAGNOSIS — M25651 Stiffness of right hip, not elsewhere classified: Secondary | ICD-10-CM

## 2020-03-14 DIAGNOSIS — M25672 Stiffness of left ankle, not elsewhere classified: Secondary | ICD-10-CM

## 2020-03-14 NOTE — Therapy (Signed)
Progress Note Reporting Period 02/08/2020 to 03/13/2020  See note below for Objective Data and Assessment of Progress/Goals.      [place] [address] [place], [place], [address] Phone: [phone]   Fax:  [phone]  Physical Therapy Treatment  Patient Details  Name: Kyle Mcguire MRN: [id number] Date of Birth: 27-Oct-1941 Referring Provider (PT): Dr Sarina Ill   Encounter Date: 03/14/2020  PT End of Session - 03/14/20 1055    Visit Number  10    Number of Visits  12    Date for PT Re-Evaluation  03/21/20    PT Start Time  1055    PT Stop Time  1140    PT Time Calculation (min)  45 min    Activity Tolerance  Patient tolerated treatment well    Behavior During Therapy  [facility] for tasks assessed/performed       Past Medical History:  Diagnosis Date  . Dementia ([place])   . Hearing loss   . Parkinson's disease [facility])     Past Surgical History:  Procedure Laterality Date  . no surgical history per patient    . None      There were no vitals filed for this visit.  Subjective Assessment - 03/14/20 1102    Subjective  Pt reports he is doing well    Pertinent History  Parkinsons, orthostatic hypotension    Patient Stated Goals  walk better and not fall.    Currently in Pain?  No/denies                       [facility] Adult PT Treatment/Exercise - 03/14/20 0001      High Level Balance   High Level Balance Comments  stepping in/out of agility ladder and side stepping in each square - very challenging for pt.       Knee/Hip Exercises: Stretches   Hip Flexor Stretch  Both;3 reps;30 seconds    Hip Flexor Stretch Limitations  supine with opposite knee to chest pelvis on yoga mat to facilitate increased hip extension    Gastroc Stretch  Both;2 reps;30 seconds    Other Knee/Hip Stretches  side lunges for stretching inner thighs with HHA     Other Knee/Hip Stretches  in hooklying windshield  wiper legs for hip rotation with over pressure      Knee/Hip Exercises: Aerobic   Recumbent Bike  L0x5'    Nustep  Level 5 x 7 minutes      Knee/Hip Exercises: Standing   Other Standing Knee Exercises  resisted gait 40# to work on eccentric control , VC to take bigger steps - he tends to shuffle    Other Standing Knee Exercises  stepping over small bar and 4" step multiple times with turns - SLS with HHA while rolling ball with other foot                  PT Long Term Goals - 03/09/20 [id number]      PT LONG TERM GOAL #3   Title  increased bilat ankle dorsiflexion =/> 12 to allow him to use ankle strategies for balance ( 03/21/2020)    Baseline  Lt 12, R t15    Status  Achieved            Plan - 03/14/20 1058    Clinical Impression Statement  Pt reports he feels like he is doing very well and making  good progressed.  Discussed finalizing his HEP and discharging to HEP next week.  He is in aggreement.  Needs to work on hiop flexor stretching.    Comorbidities  orthostatic hypotension, Parkinsons dx    Rehab Potential  Excellent    PT Frequency  2x / week    PT Duration  6 weeks    PT Treatment/Interventions  Gait training;Stair training;Functional mobility training;Patient/family education;Taping;Therapeutic activities;Passive range of motion;Therapeutic exercise;Balance training;Neuromuscular re-education;Manual techniques;Moist Heat;Cryotherapy;Electrical Stimulation    PT Next Visit Plan  finalize HEP and prepare for discharge next week.    Consulted and Agree with Plan of Care  Patient       Patient will benefit from skilled therapeutic intervention in order to improve the following deficits and impairments:  Decreased range of motion, Difficulty walking, Impaired flexibility, Decreased balance  Visit Diagnosis: Stiffness of right hip, not elsewhere classified  Stiffness of left ankle, not elsewhere classified  Other abnormalities of gait and mobility  Stiffness  of left hip, not elsewhere classified  Stiffness of right ankle, not elsewhere classified     Problem List Patient Active Problem List   Diagnosis Date Noted  . 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 Snoring 09/21/2017  . Memory loss 08/26/2017  . 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  . Parkinson's disease ([place]) 12/31/2014    Jeral Pinch PT  03/14/2020, 11:39 AM  [address] [place], [place], [address] Phone: [phone]   Fax:  [phone]  Name: BRALLAN DRATH MRN: [id number] Date of Birth: [birth year]

## 2020-03-19 ENCOUNTER — Ambulatory Visit: Payer: PPO

## 2020-03-19 ENCOUNTER — Encounter: Payer: Self-pay | Admitting: Physical Therapy

## 2020-03-19 ENCOUNTER — Ambulatory Visit: Payer: PPO | Attending: Neurology | Admitting: Physical Therapy

## 2020-03-19 ENCOUNTER — Other Ambulatory Visit: Payer: Self-pay

## 2020-03-19 DIAGNOSIS — M25671 Stiffness of right ankle, not elsewhere classified: Secondary | ICD-10-CM | POA: Diagnosis not present

## 2020-03-19 DIAGNOSIS — M25651 Stiffness of right hip, not elsewhere classified: Secondary | ICD-10-CM | POA: Insufficient documentation

## 2020-03-19 DIAGNOSIS — R2689 Other abnormalities of gait and mobility: Secondary | ICD-10-CM | POA: Diagnosis not present

## 2020-03-19 DIAGNOSIS — M25652 Stiffness of left hip, not elsewhere classified: Secondary | ICD-10-CM | POA: Insufficient documentation

## 2020-03-19 DIAGNOSIS — M25672 Stiffness of left ankle, not elsewhere classified: Secondary | ICD-10-CM | POA: Diagnosis not present

## 2020-03-19 NOTE — Therapy (Signed)
[address] [place], [place], [address] Phone: [phone]   Fax:  [phone]  Physical Therapy Treatment  Patient Details  Name: Kyle Mcguire MRN: [id number] Date of Birth: 01/12/1941 Referring Provider (PT): Dr Sarina Ill   Encounter Date: 03/19/2020  PT End of Session - 03/19/20 1525    Visit Number  11    Date for PT Re-Evaluation  03/23/20    PT Start Time  1443    PT Stop Time  1525    PT Time Calculation (min)  42 min    Activity Tolerance  Patient tolerated treatment well    Behavior During Therapy  [facility] for tasks assessed/performed       Past Medical History:  Diagnosis Date  . Dementia ([place])   . Hearing loss   . Parkinson's disease [facility]     Past Surgical History:  Procedure Laterality Date  . no surgical history per patient    . None      There were no vitals filed for this visit.  Subjective Assessment - 03/19/20 1452    Subjective  No falls, comes in with a shuffling gait pattern    Currently in Pain?  No/denies                       [facility] Adult PT Treatment/Exercise - 03/19/20 0001      Ambulation/Gait   Gait Comments  around the buiilding, practice curbs, slopes, uneven terrain, including grass and pine needles      High Level Balance   High Level Balance Activities  Side stepping;Negotitating around obstacles;Negotiating over obstacles    High Level Balance Comments  side step ball toss, walking ball toss, standing on airex ball toss, soccer sicks      Knee/Hip Exercises: Aerobic   Nustep  Level 5 x 6 minutes                  PT Long Term Goals - 03/09/20 [id number]      PT LONG TERM GOAL #3   Title  increased bilat ankle dorsiflexion =/> 12 to allow him to use ankle strategies for balance ( 03/21/2020)    Baseline  Lt 12, R t15    Status  Achieved            Plan - 03/19/20 1525    Clinical Impression Statement  Worked a lot on  balance and trying to multitask, he did very well with everything we did today, the biggest issue was on uneven terrain and when he was doing transition movments of turning, he would tend to go into a shuffling pattern, with cues on him walking he could correct this, but really had difficulty with the turns trying to change the shuffling    PT Next Visit Plan  assure good HEP and D/C spoke with him about the summer and he will be active and do well, cautioned him about the fall and winter and not being active and him regressing    Consulted and Agree with Plan of Care  Patient       Patient will benefit from skilled therapeutic intervention in order to improve the following deficits and impairments:  Decreased range of motion, Difficulty walking, Impaired flexibility, Decreased balance  Visit Diagnosis: Stiffness of right hip, not elsewhere classified  Stiffness of left ankle, not elsewhere classified  Other abnormalities of gait and mobility  Stiffness of left  hip, not elsewhere classified  Stiffness of right ankle, not elsewhere classified     Problem List Patient Active Problem List   Diagnosis Date Noted  . 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 Snoring 09/21/2017  . Memory loss 08/26/2017  . 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  . Parkinson's disease ([place]) 12/31/2014    Sumner Boast., PT 03/19/2020, 3:29 PM  [place] [address] [place], [place], [address] Phone: [phone]   Fax:  [phone]  Name: Kyle Mcguire MRN: [id number] Date of Birth: [birth year]

## 2020-03-20 ENCOUNTER — Ambulatory Visit: Payer: PPO | Admitting: Physical Therapy

## 2020-03-22 DIAGNOSIS — Z13220 Encounter for screening for lipoid disorders: Secondary | ICD-10-CM | POA: Diagnosis not present

## 2020-03-22 DIAGNOSIS — Z125 Encounter for screening for malignant neoplasm of prostate: Secondary | ICD-10-CM | POA: Diagnosis not present

## 2020-03-22 DIAGNOSIS — F0280 Dementia in other diseases classified elsewhere without behavioral disturbance: Secondary | ICD-10-CM | POA: Diagnosis not present

## 2020-03-22 DIAGNOSIS — G20 Parkinson's disease: Secondary | ICD-10-CM | POA: Diagnosis not present

## 2020-03-23 ENCOUNTER — Other Ambulatory Visit: Payer: Self-pay

## 2020-03-23 ENCOUNTER — Ambulatory Visit: Payer: PPO | Admitting: Physical Therapy

## 2020-03-23 ENCOUNTER — Encounter: Payer: Self-pay | Admitting: Physical Therapy

## 2020-03-23 DIAGNOSIS — M25652 Stiffness of left hip, not elsewhere classified: Secondary | ICD-10-CM

## 2020-03-23 DIAGNOSIS — M25651 Stiffness of right hip, not elsewhere classified: Secondary | ICD-10-CM

## 2020-03-23 DIAGNOSIS — R2689 Other abnormalities of gait and mobility: Secondary | ICD-10-CM

## 2020-03-23 DIAGNOSIS — M25671 Stiffness of right ankle, not elsewhere classified: Secondary | ICD-10-CM

## 2020-03-23 DIAGNOSIS — M25672 Stiffness of left ankle, not elsewhere classified: Secondary | ICD-10-CM

## 2020-03-23 NOTE — Therapy (Signed)
[place] [address] [place], [place], [address] Phone: [phone]   Fax:  [phone]  Physical Therapy Treatment  Patient Details  Name: Kyle Mcguire MRN: [id number] Date of Birth: 07-31-1941 Referring Provider (PT): Dr Sarina Ill   Encounter Date: 03/23/2020  PT End of Session - 03/23/20 1154    Visit Number  12    Date for PT Re-Evaluation  03/23/20    PT Start Time  1050    PT Stop Time  1133    PT Time Calculation (min)  43 min    Activity Tolerance  Patient tolerated treatment well    Behavior During Therapy  [facility] for tasks assessed/performed       Past Medical History:  Diagnosis Date  . Dementia ([place])   . Hearing loss   . Parkinson's disease [facility])     Past Surgical History:  Procedure Laterality Date  . no surgical history per patient    . None      There were no vitals filed for this visit.  Subjective Assessment - 03/23/20 1105    Subjective  Patient reports a fall, going up stairs at his sons house, reports caught his toe, no injury    Currently in Pain?  No/denies                       [facility] Adult PT Treatment/Exercise - 03/23/20 0001      Ambulation/Gait   Gait Comments  around the buiilding, practice curbs, slopes, uneven terrain, including grass and pine needles      High Level Balance   High Level Balance Activities  Side stepping;Negotitating around obstacles;Negotiating over obstacles    High Level Balance Comments  side step ball toss, walking ball toss, standing on airex ball toss, soccer sicks      Knee/Hip Exercises: Aerobic   Nustep  Level 5 x 6 minutes             PT Education - 03/23/20 1151    Education Details  4 way kicks, side stepping, toe and heel walking.  tandem walking    Person(s) Educated  Patient    Methods  Explanation;Demonstration;Handout    Comprehension  Verbalized understanding;Verbal cues required          PT  Long Term Goals - 03/23/20 1204      PT LONG TERM GOAL #1   Title  I with advanced HEP to include balance and flexibility ( 03/21/2020)    Status  Achieved      PT LONG TERM GOAL #2   Title  improve bilat hip flexibility to WNL to allow him to use hip strategies for balance ( 03/21/2020)    Status  Achieved      PT LONG TERM GOAL #3   Title  increased bilat ankle dorsiflexion =/> 12 to allow him to use ankle strategies for balance ( 03/21/2020)    Status  Achieved      PT LONG TERM GOAL #4   Title  imporve TUG =/< 12 sec to show decreased risk of falls ( 03/21/2020)    Status  Achieved      PT LONG TERM GOAL #5   Title  pt will reports no loss of control of gait speed when ambulating down inclines during his walking ( 03/21/2020)    Status  Achieved            Plan -  03/23/20 1156    Clinical Impression Statement  I really went over with him about the safety aspect of his shuffling steps especially when turning or negotiating around objects.  I gave him some compensatory strategies on counting, thinking big and assuring that he tries to go slow and be deliberate with those activities.  He really is doing well with everything I throw at him, but without cues he will revert back to the shuffles    PT Next Visit Plan  Will D/C with him doing an HEP and trying to watch the turns and limit the shuffling.  Goals met    Consulted and Agree with Plan of Care  Patient       Patient will benefit from skilled therapeutic intervention in order to improve the following deficits and impairments:  Decreased range of motion, Difficulty walking, Impaired flexibility, Decreased balance  Visit Diagnosis: Stiffness of right hip, not elsewhere classified  Stiffness of left ankle, not elsewhere classified  Other abnormalities of gait and mobility  Stiffness of left hip, not elsewhere classified  Stiffness of right ankle, not elsewhere classified     Problem List Patient Active Problem List    Diagnosis Date Noted  . 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 Snoring 09/21/2017  . Memory loss 08/26/2017  . 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  . Parkinson's disease ([place]) 12/31/2014    Sumner Boast., PT 03/23/2020, 12:05 PM  [place] [address] [place], [place], [address] Phone: [phone]   Fax:  [phone]  Name: TRIGO WINTERBOTTOM MRN: [id number] Date of Birth: 1941-02-16

## 2020-03-27 DIAGNOSIS — Z0000 Encounter for general adult medical examination without abnormal findings: Secondary | ICD-10-CM | POA: Diagnosis not present

## 2020-03-27 DIAGNOSIS — G20 Parkinson's disease: Secondary | ICD-10-CM | POA: Diagnosis not present

## 2020-03-27 DIAGNOSIS — F4321 Adjustment disorder with depressed mood: Secondary | ICD-10-CM | POA: Diagnosis not present

## 2020-03-27 DIAGNOSIS — F0280 Dementia in other diseases classified elsewhere without behavioral disturbance: Secondary | ICD-10-CM | POA: Diagnosis not present

## 2020-03-27 DIAGNOSIS — E538 Deficiency of other specified B group vitamins: Secondary | ICD-10-CM | POA: Diagnosis not present

## 2020-03-27 DIAGNOSIS — H9190 Unspecified hearing loss, unspecified ear: Secondary | ICD-10-CM | POA: Diagnosis not present

## 2020-03-31 ENCOUNTER — Other Ambulatory Visit: Payer: Self-pay | Admitting: Neurology

## 2020-03-31 DIAGNOSIS — L02229 Furuncle of trunk, unspecified: Secondary | ICD-10-CM | POA: Diagnosis not present

## 2020-03-31 DIAGNOSIS — R0781 Pleurodynia: Secondary | ICD-10-CM | POA: Diagnosis not present

## 2020-03-31 DIAGNOSIS — S2239XA Fracture of one rib, unspecified side, initial encounter for closed fracture: Secondary | ICD-10-CM | POA: Diagnosis not present

## 2020-04-03 DIAGNOSIS — D510 Vitamin B12 deficiency anemia due to intrinsic factor deficiency: Secondary | ICD-10-CM | POA: Diagnosis not present

## 2020-04-04 DIAGNOSIS — M545 Low back pain: Secondary | ICD-10-CM | POA: Diagnosis not present

## 2020-04-04 DIAGNOSIS — W19XXXD Unspecified fall, subsequent encounter: Secondary | ICD-10-CM | POA: Diagnosis not present

## 2020-04-10 DIAGNOSIS — E538 Deficiency of other specified B group vitamins: Secondary | ICD-10-CM | POA: Diagnosis not present

## 2020-04-13 ENCOUNTER — Other Ambulatory Visit: Payer: Self-pay

## 2020-04-13 ENCOUNTER — Emergency Department (HOSPITAL_COMMUNITY)
Admission: EM | Admit: 2020-04-13 | Discharge: 2020-04-13 | Disposition: A | Discharge status: Home / Self Care | Payer: PPO | Attending: Emergency Medicine | Admitting: Emergency Medicine

## 2020-04-13 ENCOUNTER — Encounter (HOSPITAL_COMMUNITY): Payer: Self-pay | Admitting: Emergency Medicine

## 2020-04-13 ENCOUNTER — Emergency Department (HOSPITAL_COMMUNITY): Payer: PPO

## 2020-04-13 DIAGNOSIS — Z7982 Long term (current) use of aspirin: Secondary | ICD-10-CM | POA: Insufficient documentation

## 2020-04-13 DIAGNOSIS — M545 Low back pain: Secondary | ICD-10-CM | POA: Insufficient documentation

## 2020-04-13 DIAGNOSIS — R109 Unspecified abdominal pain: Secondary | ICD-10-CM | POA: Insufficient documentation

## 2020-04-13 DIAGNOSIS — G20 Parkinson's disease: Secondary | ICD-10-CM | POA: Diagnosis not present

## 2020-04-13 DIAGNOSIS — S2231XA Fracture of one rib, right side, initial encounter for closed fracture: Secondary | ICD-10-CM | POA: Diagnosis not present

## 2020-04-13 LAB — CBC
HCT: 52.1 % — ABNORMAL HIGH (ref 39.0–52.0)
Hemoglobin: 17.4 g/dL — ABNORMAL HIGH (ref 13.0–17.0)
MCH: 31.9 pg (ref 26.0–34.0)
MCHC: 33.4 g/dL (ref 30.0–36.0)
MCV: 95.6 fL (ref 80.0–100.0)
Platelets: 256 10*3/uL (ref 150–400)
RBC: 5.45 MIL/uL (ref 4.22–5.81)
RDW: 13 % (ref 11.5–15.5)
WBC: 7.2 10*3/uL (ref 4.0–10.5)
nRBC: 0 % (ref 0.0–0.2)

## 2020-04-13 LAB — BASIC METABOLIC PANEL
Anion gap: 11 (ref 5–15)
BUN: 19 mg/dL (ref 8–23)
CO2: 25 mmol/L (ref 22–32)
Calcium: 9.4 mg/dL (ref 8.9–10.3)
Chloride: 105 mmol/L (ref 98–111)
Creatinine, Ser: 0.95 mg/dL (ref 0.61–1.24)
GFR calc Af Amer: >60 mL/min (ref >60)
GFR calc non Af Amer: >60 mL/min (ref >60)
Glucose, Bld: 161 mg/dL — ABNORMAL HIGH (ref 70–99)
Potassium: 4.2 mmol/L (ref 3.5–5.1)
Sodium: 141 mmol/L (ref 135–145)

## 2020-04-13 MED ORDER — METHOCARBAMOL 500 MG PO TABS
500.0000 mg | ORAL_TABLET | Freq: Three times a day (TID) | ORAL | 0 refills | Status: DC | PRN
Start: 2020-04-13 — End: 2021-04-11

## 2020-04-13 NOTE — ED Triage Notes (Signed)
Patient reports fall backwards down cement stairs x 2 weeks ago. States seen at that time and dx with broken rib. C/o left hip and lower back pain. Reports worsening pain with movement. Denies head injury and LOC.

## 2020-04-13 NOTE — ED Provider Notes (Signed)
[facility] DEPT Provider Note   CSN: [id number] Arrival date & time: 04/13/20  1053     History Chief Complaint  Patient presents with  . Fall  . Back Pain    Kyle Mcguire is a 79 y.o. male.  HPI Level five caveat due to dementia.  History comes from patient and his wife.  Around 3 weeks ago had a fall on cement stairs related back onto some cement pavers.  Reportedly had left flank pain at the time.  Had been doing well for around a week but then developed pain in the left flank that was severe enough to go to the PCP.  Reportedly had an x-ray that showed some rib fractures.  Also reportedly have low back pain with negative x-ray.  Continued pain over the last 2 weeks.  States was seen at the [place].  Pain worse with movements.  No difficulty breathing.  Unknown which ribs could have been broken.  Has a history of dementia and Parkinson's disease.    Past Medical History:  Diagnosis Date  . Dementia ([place])   . Hearing loss   . Parkinson's disease [facility])     Patient Active Problem List   Diagnosis Date Noted  . 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 Snoring 09/21/2017  . Memory loss 08/26/2017  . 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  . Parkinson's disease ([place]) 12/31/2014    Past Surgical History:  Procedure Laterality Date  . no surgical history per patient    . None         Family History  Problem Relation Age of Onset  . Parkinsonism Father     Social History   Tobacco Use  . Smoking status: Never Smoker  . Smokeless tobacco: Never Used  Substance Use Topics  . Alcohol use: No    Alcohol/week: 0.0 standard drinks  . Drug use: No    Home Medications Prior to Admission medications   Medication Sig Start Date End Date Taking? Authorizing Provider  aspirin 81 MG chewable tablet Chew 81 mg by mouth daily.    [provider]  carbidopa-levodopa (SINEMET IR)  25-100 MG tablet TAKE 1 TABLET BY MOUTH FOUR TIMES DAILY 09/18/19   [name], MD  FIBER PO Take 1 tablet by mouth at bedtime.    [provider]  methocarbamol (ROBAXIN) 500 MG tablet Take 1 tablet (500 mg total) by mouth every 8 (eight) hours as needed for muscle spasms. 04/13/20   Davonna Belling, MD  rivastigmine (EXELON) 4.6 mg/24hr APPLY 1 PATCH(4.6 MG) EXTERNALLY TO THE SKIN DAILY 04/02/20   [name], MD    Allergies    Patient has no known allergies.  Review of Systems   Review of Systems  Unable to perform ROS: Dementia    Physical Exam Updated Vital Signs BP 105/77   Pulse 77   Temp 97.6 F (36.4 C) (Oral)   Resp 18   Ht 5\' 5"  (1.651 m)   Wt 89.4 kg   SpO2 99%   BMI 32.78 kg/m   Physical Exam Vitals and nursing note reviewed.  HENT:     Head: Normocephalic and atraumatic.  Eyes:     General: No scleral icterus. Cardiovascular:     Rate and Rhythm: Normal rate and regular rhythm.  Pulmonary:     Effort: Pulmonary effort is normal.  Abdominal:     Tenderness: There is no abdominal tenderness.  Genitourinary:    Comments: Mild tenderness to left posterior lower ribs.  No deformity.  No subcu emphysema.  No ecchymosis.  No tenderness along lumbar spine. Musculoskeletal:     Cervical back: Neck supple.  Skin:    General: Skin is warm.  Neurological:     Mental Status: He is alert. Mental status is at baseline.     ED Results / Procedures / Treatments   Labs (all labs ordered are listed, but only abnormal results are displayed) Labs Reviewed  CBC - Abnormal; Notable for the following components:      Result Value   Hemoglobin 17.4 (*)    HCT 52.1 (*)    All other components within normal limits  BASIC METABOLIC PANEL - Abnormal; Notable for the following components:   Glucose, Bld 161 (*)    All other components within normal limits  URINALYSIS, ROUTINE W REFLEX MICROSCOPIC    EKG None  Radiology DG Ribs Unilateral  W/Chest Left  Result Date: 04/13/2020 CLINICAL DATA:  [place] backwards down cement stairs 2 weeks ago, persistent LEFT lower back and LEFT hip pain, fractured RIGHT rib EXAM: LEFT RIBS AND CHEST - 3+ VIEW COMPARISON:  04/02/2020 FINDINGS: Normal heart size, mediastinal contours, and pulmonary vascularity. Atherosclerotic calcification and mild tortuosity of thoracic aorta. Lungs clear. No pulmonary infiltrate, pleural effusion, or pneumothorax. Osseous demineralization. Displaced fracture of the posterolateral RIGHT sixth rib. No LEFT rib abnormalities identified. IMPRESSION: No acute LEFT rib abnormalities. Displaced fracture RIGHT sixth rib again seen. Electronically Signed   By: Lavonia Dana M.D.   On: 04/13/2020 12:24    Procedures Procedures (including critical care time)  Medications Ordered in ED Medications - No data to display  ED Course  I have reviewed the triage vital signs and the nursing notes.  Pertinent labs & imaging results that were available during my care of the patient were reviewed by me and considered in my medical decision making (see chart for details).    MDM Rules/Calculators/A&P                      Patient with left flank pain.  Began around 2 weeks ago.  Had x-ray that showed possible contralateral rib fracture.  Has mild hypotension here.  Blood pressure has improved.  Hemoglobin mildly up which likely consistent with some dehydration.  Patient's wife states he has not been drinking much.  No dysuria.  Benign abdominal exam.  No midline tenderness.  Would have like to get a urinalysis to evaluate for other pathology such as kidney stone or kidney injury.  However patient is eager to leave patient's wife states he really does not want to be here.  They will follow-up as an outpatient.  Other pathology such as AAA felt less likely.  Doubt severe bleed from trauma since hemoglobin is elevated.  Discharge home.  Will treat with muscle relaxer Final Clinical Impression(s)  / ED Diagnoses Final diagnoses:  Flank pain    Rx / DC Orders ED Discharge Orders         Ordered    methocarbamol (ROBAXIN) 500 MG tablet  Every 8 hours PRN     04/13/20 1406           Davonna Belling, MD 04/13/20 1414

## 2020-04-13 NOTE — Discharge Instructions (Signed)
Follow-up with his doctor.  We were not able to check the urine today.  Take the muscle relaxer as needed to see if it will help

## 2020-04-13 NOTE — ED Notes (Signed)
ED Provider at bedside. 

## 2020-04-13 NOTE — ED Notes (Addendum)
An After Visit Summary was printed and given to the patient. Discharge instructions given and no further questions at this time.  Pt leaving with wife.  

## 2020-04-17 DIAGNOSIS — M545 Low back pain: Secondary | ICD-10-CM | POA: Diagnosis not present

## 2020-04-17 DIAGNOSIS — M25552 Pain in left hip: Secondary | ICD-10-CM | POA: Diagnosis not present

## 2020-04-17 DIAGNOSIS — M5136 Other intervertebral disc degeneration, lumbar region: Secondary | ICD-10-CM | POA: Diagnosis not present

## 2020-04-17 DIAGNOSIS — S39012A Strain of muscle, fascia and tendon of lower back, initial encounter: Secondary | ICD-10-CM | POA: Diagnosis not present

## 2020-04-17 DIAGNOSIS — D510 Vitamin B12 deficiency anemia due to intrinsic factor deficiency: Secondary | ICD-10-CM | POA: Diagnosis not present

## 2020-04-23 ENCOUNTER — Ambulatory Visit: Payer: PPO | Admitting: Physical Therapy

## 2020-04-23 ENCOUNTER — Encounter: Payer: Self-pay | Admitting: Physical Therapy

## 2020-04-23 ENCOUNTER — Other Ambulatory Visit: Payer: Self-pay

## 2020-04-23 ENCOUNTER — Ambulatory Visit: Payer: PPO | Attending: Orthopedic Surgery | Admitting: Physical Therapy

## 2020-04-23 DIAGNOSIS — R296 Repeated falls: Secondary | ICD-10-CM | POA: Diagnosis not present

## 2020-04-23 DIAGNOSIS — M25651 Stiffness of right hip, not elsewhere classified: Secondary | ICD-10-CM | POA: Insufficient documentation

## 2020-04-23 DIAGNOSIS — M545 Low back pain, unspecified: Secondary | ICD-10-CM

## 2020-04-23 DIAGNOSIS — R2681 Unsteadiness on feet: Secondary | ICD-10-CM | POA: Insufficient documentation

## 2020-04-23 NOTE — Therapy (Signed)
[address] [place], [place], [address] Phone: [phone]   Fax:  [phone]  Physical Therapy Evaluation  Patient Details  Name: Kyle Mcguire MRN: [id number] Date of Birth: 1941/01/01 Referring Provider (PT): Jaynee Eagles   Encounter Date: 04/23/2020  PT End of Session - 04/23/20 0951    Visit Number  1    Date for PT Re-Evaluation  06/23/20    PT Start Time  0925    PT Stop Time  1010    PT Time Calculation (min)  45 min    Activity Tolerance  Patient tolerated treatment well    Behavior During Therapy  [facility] for tasks assessed/performed       Past Medical History:  Diagnosis Date  . Dementia ([place])   . Hearing loss   . Parkinson's disease [facility])     Past Surgical History:  Procedure Laterality Date  . no surgical history per patient    . None      There were no vitals filed for this visit.   Subjective Assessment - 04/23/20 0931    Subjective  Patient was seen here about a month ago, he was doing well and was discharged, her reports that soon after we saw him he was at his son's house and was going up the stairs and he fell backward, he reports that he was hurting, he did not go to the MD until abut 2-3 weeks later when the pain had continued, he then went to the ED, x-rays showed a fracture of the rib, he denies any falls since that time    Pertinent History  Parkinsons, orthostatic hypotension, dementia    Patient Stated Goals  walk better and not fall. have less back pain    Currently in Pain?  Yes    Pain Score  2     Pain Location  Back    Pain Orientation  Lower    Pain Descriptors / Indicators  Aching    Pain Type  Acute pain    Pain Onset  1 to 4 weeks ago    Pain Frequency  Intermittent    Aggravating Factors   walking pain will start after a few feet  pain up to 6/10    Pain Relieving Factors  rest, ibuprofen pain can be 0/10    Effect of Pain on Daily Activities  just limits  walking         [facility] PT Assessment - 04/23/20 0001      Assessment   Medical Diagnosis  repeated falls, LBP    Referring Provider (PT)  Jaynee Eagles    Onset Date/Surgical Date  04/13/20    Prior Therapy  yes a month ago with good results but then a recent fall      Balance Screen   Has the patient fallen in the past 6 months  Yes    How many times?  2    Has the patient had a decrease in activity level because of a fear of falling?   No    Is the patient reluctant to leave their home because of a fear of falling?   No      Home Environment   Additional Comments  one step into the home, does yardwork, some housework      Prior Function   Level of Independence  Independent    Vocation  Retired    Leisure  no exercise  ROM / Strength   AROM / PROM / Strength  Strength      Strength   Overall Strength Comments  LE strength 4/5      Ambulation/Gait   Gait Comments  gait is without device, slow, he does have festenating gait with turns or negotiating obstacles and with transfers      Standardized Balance Assessment   Standardized Balance Assessment  Berg Balance Test      Berg Balance Test   Sit to Stand  Able to stand  independently using hands    Standing Unsupported  Able to stand safely 2 minutes    Sitting with Back Unsupported but Feet Supported on Floor or Stool  Able to sit safely and securely 2 minutes    Stand to Sit  Sits safely with minimal use of hands    Transfers  Able to transfer safely, definite need of hands    Standing Unsupported with Eyes Closed  Able to stand 10 seconds safely    Standing Unsupported with Feet Together  Able to place feet together independently and stand 1 minute safely    From Standing, Reach Forward with Outstretched Arm  Can reach forward >12 cm safely (5")    From Standing Position, Pick up Object from Floor  Able to pick up shoe safely and easily    From Standing Position, Turn to Look Behind Over each Shoulder  Looks behind one  side only/other side shows less weight shift    Turn 360 Degrees  Able to turn 360 degrees safely but slowly    Standing Unsupported, Alternately Place Feet on Step/Stool  Able to stand independently and complete 8 steps >20 seconds    Standing Unsupported, One Foot in Front  Able to plae foot ahead of the other independently and hold 30 seconds    Standing on One Leg  Able to lift leg independently and hold equal to or more than 3 seconds    Total Score  46      Timed Up and Go Test   Normal TUG (seconds)  24                Objective measurements completed on examination: See above findings.                   PT Long Term Goals - 04/23/20 0955      PT LONG TERM GOAL #1   Title  I with advanced HEP to include balance and flexibility    Time  8    Period  Weeks    Status  New      PT LONG TERM GOAL #2   Title  decrease TUG to 14 seconds    Time  8    Period  Weeks    Status  New      PT LONG TERM GOAL #3   Title  increase Berg balance score to 49/56    Time  8    Period  Weeks    Status  New      PT LONG TERM GOAL #4   Title  demonstrate a transfer from one sitting surface to another 10 feet away with no shuffling pattern noted    Time  8    Period  Weeks    Status  New      PT LONG TERM GOAL #5   Title  pt will reports no loss of control of gait speed when ambulating down inclines  during his walking    Baseline  Pt reports he is having no problems walking down hill now, has not fallen or loss his balance lately    Time  8    Period  Weeks    Status  New             Plan - 04/23/20 [id number]    Clinical Impression Statement  Patient was seen here earlier in the year for a fall and unsteady gait, he was discharged from [place] on 03/23/20, he reports a fall not long after that going up stairs, he did not go to the ED until 04/13/20 due to rib and back pain, had a fracture of the rib, Berg balance was 45/56, TUG was 24 seconds, putting him at a higher  risk for falls, he does pretty well with straight movements and with static standing, his issues are with dynamic movements and mostly when transferring or having to negotiate around items.    Personal Factors and Comorbidities  Comorbidity 3+    Comorbidities  orthostatic hypotension, Parkinsons dx, dementia    Examination-Activity Limitations  Locomotion Level    Stability/Clinical Decision Making  Stable/Uncomplicated    Clinical Decision Making  Low    Rehab Potential  Good    PT Frequency  1x / week    PT Duration  8 weeks    PT Treatment/Interventions  Gait training;Stair training;Functional mobility training;Patient/family education;Taping;Therapeutic activities;Passive range of motion;Therapeutic exercise;Balance training;Neuromuscular re-education;Manual techniques;Moist Heat;Cryotherapy;Electrical Stimulation    PT Next Visit Plan  resume PT working on balance, treat pain as needed    Consulted and Agree with Plan of Care  Patient       Patient will benefit from skilled therapeutic intervention in order to improve the following deficits and impairments:  Decreased range of motion, Difficulty walking, Impaired flexibility, Decreased balance, Abnormal gait, Decreased coordination, Pain, Decreased mobility, Decreased strength  Visit Diagnosis: Repeated falls - Plan: PT plan of care cert/re-cert  Unsteady gait - Plan: PT plan of care cert/re-cert  Acute bilateral low back pain without sciatica - Plan: PT plan of care cert/re-cert     Problem List Patient Active Problem List   Diagnosis Date Noted  . 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 Snoring 09/21/2017  . Memory loss 08/26/2017  . 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  . Parkinson's disease ([place]) 12/31/2014    Sumner Boast., PT 04/23/2020, 10:02 AM  [address] [place], [place],  [address] Phone: [phone]   Fax:  [phone]  Name: TAYTE LOREK MRN: [id number] Date of Birth: Aug 03, 1941

## 2020-04-24 DIAGNOSIS — F0280 Dementia in other diseases classified elsewhere without behavioral disturbance: Secondary | ICD-10-CM | POA: Diagnosis not present

## 2020-04-24 DIAGNOSIS — E538 Deficiency of other specified B group vitamins: Secondary | ICD-10-CM | POA: Diagnosis not present

## 2020-04-24 DIAGNOSIS — G20 Parkinson's disease: Secondary | ICD-10-CM | POA: Diagnosis not present

## 2020-04-24 DIAGNOSIS — Z0000 Encounter for general adult medical examination without abnormal findings: Secondary | ICD-10-CM | POA: Diagnosis not present

## 2020-04-24 DIAGNOSIS — H9190 Unspecified hearing loss, unspecified ear: Secondary | ICD-10-CM | POA: Diagnosis not present

## 2020-05-02 ENCOUNTER — Ambulatory Visit: Payer: PPO | Admitting: Physical Therapy

## 2020-05-02 ENCOUNTER — Encounter: Payer: Self-pay | Admitting: Physical Therapy

## 2020-05-02 ENCOUNTER — Other Ambulatory Visit: Payer: Self-pay

## 2020-05-02 DIAGNOSIS — R296 Repeated falls: Secondary | ICD-10-CM | POA: Diagnosis not present

## 2020-05-02 DIAGNOSIS — M25651 Stiffness of right hip, not elsewhere classified: Secondary | ICD-10-CM

## 2020-05-02 DIAGNOSIS — M545 Low back pain, unspecified: Secondary | ICD-10-CM

## 2020-05-02 DIAGNOSIS — R2681 Unsteadiness on feet: Secondary | ICD-10-CM

## 2020-05-02 NOTE — Therapy (Signed)
[address] [place], [place], [address] Phone: [phone]   Fax:  [phone]  Physical Therapy Treatment  Patient Details  Name: Kyle Mcguire MRN: [id number] Date of Birth: 11-16-1941 Referring Provider (PT): Jaynee Eagles   Encounter Date: 05/02/2020  PT End of Session - 05/02/20 0959    Visit Number  2    Date for PT Re-Evaluation  06/23/20    PT Start Time  0919    PT Stop Time  1009    PT Time Calculation (min)  50 min    Activity Tolerance  Patient tolerated treatment well    Behavior During Therapy  [facility] for tasks assessed/performed       Past Medical History:  Diagnosis Date  . Dementia ([place])   . Hearing loss   . Parkinson's disease [facility])     Past Surgical History:  Procedure Laterality Date  . no surgical history per patient    . None      There were no vitals filed for this visit.  Subjective Assessment - 05/02/20 0922    Subjective  Patient reports a fall earlier in the week, reports he got up from sitting and went to walk and he started the shuffling feet pattern and could not get his legs going and fell    Currently in Pain?  No/denies                        [facility] Adult PT Treatment/Exercise - 05/02/20 0001      Ambulation/Gait   Gait Comments  practiced gait wiht a lot of cues for arm swing, stairs down one at a time, needs cues to clear the step, going up, does step over step      High Level Balance   High Level Balance Activities  Side stepping;Backward walking;Direction changes;Sudden stops;Negotitating around obstacles;Negotiating over obstacles    High Level Balance Comments  ball toss, ball kicks, walking ball toss      Knee/Hip Exercises: Aerobic   Recumbent Bike  L0x5'    Nustep  Level 5 x 7 minutes      Knee/Hip Exercises: Standing   Other Standing Knee Exercises  stepping over small bar and 4" step multiple times with turns - SLS with HHA while  rolling ball with other foot                  PT Long Term Goals - 05/02/20 1001      PT LONG TERM GOAL #2   Title  decrease TUG to 14 seconds    Status  On-going            Plan - 05/02/20 0959    Clinical Impression Statement  Patient really needs a lot of safety cues for pausing and taking big steps, he tends to rush and then gets into the shuffling pattern, he also lacks arm swing, did well with cues on the walking ball toss.  Again many times will need cues to slow down and take big steps    PT Next Visit Plan  really focus on balance and safety with walking    Consulted and Agree with Plan of Care  Patient       Patient will benefit from skilled therapeutic intervention in order to improve the following deficits and impairments:  Decreased range of motion, Difficulty walking, Impaired flexibility, Decreased balance, Abnormal gait, Decreased coordination, Pain, Decreased mobility, Decreased  strength  Visit Diagnosis: Repeated falls  Unsteady gait  Acute bilateral low back pain without sciatica  Stiffness of right hip, not elsewhere classified     Problem List Patient Active Problem List   Diagnosis Date Noted  . 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 Snoring 09/21/2017  . Memory loss 08/26/2017  . 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  . Parkinson's disease ([place]) 12/31/2014    Sumner Boast., PT 05/02/2020, 10:02 AM  [address] [place], [place], [address] Phone: [phone]   Fax:  [phone]  Name: Kyle Mcguire MRN: [id number] Date of Birth: 04-Feb-1941

## 2020-05-09 ENCOUNTER — Encounter: Payer: Self-pay | Admitting: Physical Therapy

## 2020-05-09 ENCOUNTER — Other Ambulatory Visit: Payer: Self-pay

## 2020-05-09 ENCOUNTER — Ambulatory Visit: Payer: PPO | Admitting: Physical Therapy

## 2020-05-09 DIAGNOSIS — R296 Repeated falls: Secondary | ICD-10-CM | POA: Diagnosis not present

## 2020-05-09 DIAGNOSIS — R2681 Unsteadiness on feet: Secondary | ICD-10-CM

## 2020-05-09 DIAGNOSIS — M545 Low back pain, unspecified: Secondary | ICD-10-CM

## 2020-05-09 NOTE — Therapy (Signed)
[address] [place], [place], [address] Phone: [phone]   Fax:  [phone]  Physical Therapy Treatment  Patient Details  Name: Kyle Mcguire MRN: [id number] Date of Birth: 23-Sep-1941 Referring Provider (PT): Jaynee Eagles   Encounter Date: 05/09/2020  PT End of Session - 05/09/20 1001    Visit Number  3    Number of Visits  12    Date for PT Re-Evaluation  06/23/20    PT Start Time  0920    PT Stop Time  1003    PT Time Calculation (min)  43 min    Activity Tolerance  Patient tolerated treatment well    Behavior During Therapy  [facility] for tasks assessed/performed       Past Medical History:  Diagnosis Date  . Dementia ([place])   . Hearing loss   . Parkinson's disease [facility])     Past Surgical History:  Procedure Laterality Date  . no surgical history per patient    . None      There were no vitals filed for this visit.  Subjective Assessment - 05/09/20 0925    Subjective  No falls    Currently in Pain?  No/denies                        [facility] Adult PT Treatment/Exercise - 05/09/20 0001      Transfers   Comments  worked on transfers from chair to chair as this is where he really festenates      Ambulation/Gait   Gait Comments  up and down stairs step over step, then outside on uneven terrain      High Level Balance   High Level Balance Activities  Side stepping;Backward walking;Direction changes;Sudden stops;Negotitating around obstacles;Negotiating over obstacles    High Level Balance Comments  ball toss, ball kicks, walking ball toss, 6" toe taps, on airex ball toss, then eyes closed      Knee/Hip Exercises: Stretches   Gastroc Stretch  Both;2 reps;30 seconds      Knee/Hip Exercises: Aerobic   Recumbent Bike  L0x5'    Nustep  Level 5 x 7 minutes                  PT Long Term Goals - 05/09/20 1006      PT LONG TERM GOAL #1   Title  I with advanced HEP to  include balance and flexibility    Status  On-going      PT LONG TERM GOAL #3   Title  increase Berg balance score to 49/56    Status  On-going            Plan - 05/09/20 1002    Clinical Impression Statement  Patient moving better for straight line motions, his issue really is the transfers and the turning to sit part, he continually got stuck festenating on the turnaround.  I tried to get him to count in his head and say big in his head or out loud.    PT Next Visit Plan  really focus on balance and safety with walking    Consulted and Agree with Plan of Care  Patient       Patient will benefit from skilled therapeutic intervention in order to improve the following deficits and impairments:  Decreased range of motion, Difficulty walking, Impaired flexibility, Decreased balance, Abnormal gait, Decreased coordination, Pain, Decreased mobility, Decreased strength  Visit Diagnosis: Repeated falls  Unsteady gait  Acute bilateral low back pain without sciatica     Problem List Patient Active Problem List   Diagnosis Date Noted  . 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 Snoring 09/21/2017  . Memory loss 08/26/2017  . 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  . Parkinson's disease ([place]) 12/31/2014    Sumner Boast., PT 05/09/2020, 10:08 AM  [address] [place], [place], [address] Phone: [phone]   Fax:  [phone]  Name: Kyle Mcguire MRN: [id number] Date of Birth: 1941-11-24

## 2020-05-15 DIAGNOSIS — L03312 Cellulitis of back [any part except buttock]: Secondary | ICD-10-CM | POA: Diagnosis not present

## 2020-05-15 DIAGNOSIS — L02222 Furuncle of back [any part, except buttock]: Secondary | ICD-10-CM | POA: Diagnosis not present

## 2020-05-15 DIAGNOSIS — D510 Vitamin B12 deficiency anemia due to intrinsic factor deficiency: Secondary | ICD-10-CM | POA: Diagnosis not present

## 2020-05-16 ENCOUNTER — Ambulatory Visit: Payer: PPO | Attending: Neurology | Admitting: Physical Therapy

## 2020-05-16 ENCOUNTER — Other Ambulatory Visit: Payer: Self-pay

## 2020-05-16 ENCOUNTER — Encounter: Payer: Self-pay | Admitting: Physical Therapy

## 2020-05-16 ENCOUNTER — Ambulatory Visit (INDEPENDENT_AMBULATORY_CARE_PROVIDER_SITE_OTHER): Payer: PPO | Admitting: Neurology

## 2020-05-16 ENCOUNTER — Telehealth: Payer: Self-pay | Admitting: Neurology

## 2020-05-16 DIAGNOSIS — G20 Parkinson's disease: Secondary | ICD-10-CM | POA: Diagnosis not present

## 2020-05-16 DIAGNOSIS — R296 Repeated falls: Secondary | ICD-10-CM

## 2020-05-16 DIAGNOSIS — R2681 Unsteadiness on feet: Secondary | ICD-10-CM

## 2020-05-16 DIAGNOSIS — M545 Low back pain, unspecified: Secondary | ICD-10-CM

## 2020-05-16 MED ORDER — CARBIDOPA-LEVODOPA 25-100 MG PO TABS: 1.0000 | ORAL_TABLET | Freq: Four times a day (QID) | ORAL | 3 refills | Status: DC

## 2020-05-16 MED ORDER — RIVASTIGMINE 4.6 MG/24HR TD PT24: MEDICATED_PATCH | TRANSDERMAL | 3 refills | Status: DC

## 2020-05-16 NOTE — Telephone Encounter (Signed)
Wife sometimes has a difficult time coming with patient to appointments, so I did call patient and left a message stating that it would be fine to have a phone or video appointment if he was feeling stable and wanted to stay at home with his wife and we can all talk on the phone.  But more than happy to see him in the office  As well.

## 2020-05-16 NOTE — Therapy (Signed)
[address] [place], [place], [address] Phone: [phone]   Fax:  [phone]  Physical Therapy Treatment  Patient Details  Name: Kyle Mcguire MRN: [id number] Date of Birth: 19-Oct-1941 Referring Provider (PT): Jaynee Eagles   Encounter Date: 05/16/2020  PT End of Session - 05/16/20 1145    Visit Number  4    Date for PT Re-Evaluation  06/23/20    PT Start Time  1050    PT Stop Time  1140    PT Time Calculation (min)  50 min    Activity Tolerance  Patient tolerated treatment well    Behavior During Therapy  [facility] for tasks assessed/performed       Past Medical History:  Diagnosis Date  . Dementia ([place])   . Hearing loss   . Parkinson's disease [facility])     Past Surgical History:  Procedure Laterality Date  . no surgical history per patient    . None      There were no vitals filed for this visit.  Subjective Assessment - 05/16/20 1100    Subjective  No falls, reports some stutter stepping at times                        [facility] Adult PT Treatment/Exercise - 05/16/20 0001      High Level Balance   High Level Balance Activities  Side stepping;Backward walking;Direction changes;Sudden stops;Negotitating around obstacles;Negotiating over obstacles    High Level Balance Comments  ball toss, ball kicks, walking ball toss, 8" toe taps, on airex ball toss, then eyes closed      Knee/Hip Exercises: Aerobic   Recumbent Bike  L0x6'    Nustep  Level 5 x 7 minutes      Knee/Hip Exercises: Machines for Strengthening   Cybex Knee Extension  5# 2x10, had him try alternating left and right single legs    Cybex Leg Press  30lb 2x10                   PT Long Term Goals - 05/16/20 1148      PT LONG TERM GOAL #4   Title  demonstrate a transfer from one sitting surface to another 10 feet away with no shuffling pattern noted    Status  Partially Met            Plan - 05/16/20 1146     Clinical Impression Statement  Patient is demonstrating more arm swing with walking without verbal cues.  U tried to add things to obstacle course with stepping around and over items in the same course, he did well with this today.  There were a few times that he had a small stutter step mostly with turning to sit down.  With the ball kicks he struggled with kicking with the left foot but once he got into a rythym he did better.    PT Next Visit Plan  really focus on balance and safety with walking    Consulted and Agree with Plan of Care  Patient       Patient will benefit from skilled therapeutic intervention in order to improve the following deficits and impairments:  Decreased range of motion, Difficulty walking, Impaired flexibility, Decreased balance, Abnormal gait, Decreased coordination, Pain, Decreased mobility, Decreased strength  Visit Diagnosis: Repeated falls  Unsteady gait  Acute bilateral low back pain without sciatica     Problem List Patient  Active Problem List   Diagnosis Date Noted  . 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 Snoring 09/21/2017  . Memory loss 08/26/2017  . 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  . Parkinson's disease ([place]) 12/31/2014    Sumner Boast., PT 05/16/2020, 11:49 AM  [address] [place], [place], [address] Phone: [phone]   Fax:  [phone]  Name: Kyle Mcguire MRN: [id number] Date of Birth: 1941-08-20

## 2020-05-16 NOTE — Progress Notes (Signed)
[id number]    Provider:  Dr Kyle Mcguire Referring Provider: Vernie Shanks, MD Primary Care Physician:  Kyle Shanks, MD CC: PD  Virtual Visit via Telephone Note  I connected with Kyle Mcguire on 05/16/20 at  3:00 PM EDT by telephone and verified that I am speaking with the correct person using two identifiers.  Location: Patient: home Provider: office   I discussed the limitations, risks, security and privacy concerns of performing an evaluation and management service by telephone and the availability of in person appointments. I also discussed with the patient that there may be a patient responsible charge related to this service. The patient expressed understanding and agreed to proceed.   Follow Up Instructions:    I discussed the assessment and treatment plan with the patient. The patient was provided an opportunity to ask questions and all were answered. The patient agreed with the plan and demonstrated an understanding of the instructions.   The patient was advised to call back or seek an in-person evaluation if the symptoms worsen or if the condition fails to improve as anticipated.  I provided 15  minutes of non-face-to-face time during this encounter.   Kyle Beam, MD  05/16/2020: No complaints of dizziness. His wife provided much information. He is going for b12 shots and feeling better. He is getting injections. No falls, new symptoms, stable. He is even behaving himself. He is walking every day now, little walks. He is doing well. Stable with sinemet. Taking the Exelon Patch. He was having back problems, he has degenerative disk disease, he is in physical therapy, he has been to [facility]. He will restart the [facility] after that. He says he is feeling well, he is walking, he is going to PT, he feels stable, no falls. He mows his son's lawn weekly.   12/13/2019; Wife says she has to help him with a lot more things. He is very mellow, good  mood. He doesn't complain of anything. She hs to give him cues to do things like brush teeth. No pain. He still is not very active. He does however take a short walk every day. He drinks pepsi daily, he also drinks a lot of flavored water. Try to get low cal and pepsi no caffeine. Discussed with wife in detail. He is still dizzy but it is better. He has started falling, balance is deteriorating. We will have PT to his house for walking assistance training and balance    Interval history 03/16/2019: Spoke with wife and patient. They are feeling well. They are staying in the house. No falls. Tremor has not worsened. They have a timer on his phone. He has not been dizzy, stopped drinking pepsi totally, no dizziness or lightheadedness. He drinks water and sparkling water. No swallowing problems. He has lost weight but this is good. He is going to rock steady 2x a week. Memory stable. Still driving, no accidents. Aricept causing diarrhea will try an exelon patch.  Interval history 09/13/2018: Lovely gentleman here with his wife for follow up of PD.  He was seen by Dr. Marcos Mcguire who provided much information on his condition and also recommendations such as activities and exercise. 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 Will request they meet with the social worker at [facility]. He goes ro [employer] 2x a week which is new and excellent.  He has not been dizzy lately, discussed hydration. No recent falls - 5-6 months ago he fell but  none since . No swallowing difficulties.  No difficulties with the Sinemet. Tremors are stable, not bothersome. He is stable, does not want to change any management, will keep om the same dose of Sinemet. He is drinking more sprakling water and no pepsi and he likes it. No problems swallowing. Starting to have back problems. He is inactive most days except the boxing now 2x a week. He was dxed with REM sleep disorder but it doesn't affect him. They bowl every Monday.   Interval history  08/25/2017: Had falls when on Flexeril but none since then because it made him dizzy. Still having dizziness. Memory changes started slowly 3-4 years ago and slowly progressive. Started with short-term memory issues. He would forget sometimes which road to take, little things. But not progressive and wosening. Wife has been managing his medications "for a while". Confusion worsens later in the day.He used to pay the bills but now his wife pays for them. Wife makes out the checks because of the shaking. Wife found bills in the past that were not paid. He is less social. He doesn't remember their anniversary anymore, he used to all the time. He is in a different room because of the snoring, he nods off a lot during the day. Wife is here and provides all information. He gets up and has breakfast, he broses through the paper and then he lays on the couch until 11am nodding off. He sits in the recliner all afternoon. Wife feels that he is developing dementia and has had a change in personality.has changed, he used to be very active, he used to work part time. He does take care of the laundry and cleans up after dinner. They started bowling, wife is trying to get him out more often.He is anxious and worrying a lot.He worries a lot. He has mellowed which is good. No hallucinations.     Interval history: He feels like the dizziness has improved. Discussed northera. He feels that the dizziness is not too significant. We tried Florinef but his bllod pressure was too elevated. He last took the Sinemet at 7 this morning and he has no tremor, then he takes it at noon and then at 7pm and he goes to sleep at 10pm. He feels under control. No risk of fallin gfrom the dizziness, he has been drinking more water and feels better and less dizzy. His blood pressures from the last month have ranged 120-143/ 77-89 which is excellent. Hydrating more. One appointment was 101/82.  3-4 glasses of water a day and lemonade as well. No  swallowing issues. No falls.   HPI: Kyle Mcguire is a wonderful 79 y.o. male here as a follow up for parkinson's disease. He is doing on Sinemet. 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 He was started on Florinef at last appointment and comes in today with his wife for the first time to discuss.  Interval history 09/25/2016: Patient comes in with wife today, this the first time I'm meeting her. We reviewed the patient's history and the medications we have tried. Patient's tremor responds to Sinemet. In the past we had him on 3 pills a day and patient felt as though the Effexor good but he felt them wearing off during the day. We increased him to 4 pills. Discussed the proper way to take it, without protein as this might interfere with absorption. Also advised he can take the pills every 3-4 hours. I asked his wife to notice  when she sees the tremor improved after taking medication and if she notices the medications wearing off. Also had a long discussion about hydration, patient drinks several Cokes daily but does not drink much fluid. I advised that this may be what is causing his dizziness when he stands up. Orthostatic hypotension can also be seen in Parkinson's disease or caused by Sinemet. Asked them to keep a fluids diary. The Florinef is making his blood pressure too high advised him to stop it.  Interval history 08/2016: Tremor is good. He is dizzy all the time. Things are blurry when he looks at them. He feels dizzy even now when sitting. His orthostatics have been normal. He takes the carbidopa/levodopa 4 x a day. He last took the medication at 8am this morning and his tremor is fine. Will go back to 3 pills of Sinemet a day and see if this improved his dizziness. if needed, florinef 0.1, 1/2 pill up to bid for starters  Interval history 01/2016:He is on 1 tab 4 times a day every 4 hours. His tremor is better. He takes the medication  around 8am he feels the tremor about 10 - 10:30. Discussed that can take it every 3 hours instead. He feels the medication wearing off. However very mild tremor right now and he took his pill at 8am. He is having dizziness all the time. That started a month ago. There was no change in medication a month ago. Worse when moving. He is not drinking enough water. In the morning he has coffee. At lunch time he has a sandwhich or half a sandwich and lemonade. And a full glass of water with his supper. No room spinning. He is lightheaded, no CP, no SOB. No falls. No dysphagia. Not with sitting or laying. More upon standing and ambulating.   Interval history 07/2015:He took the sinemet this morning at 7:30am. The tremor is better right now. The sinemet is helping it, it just wears off. Not hard for him to take it 3x a day. Once in a while he forgets. He doesn't know how long the sinemet lasts. No dizziness when he stands up. Not lightheaded, no symptoms fron the sinemet. Right now at this moment he is happy with his tremor, last took the Sinemet a few hours ago. He gets up at 7:30 amd walks to sheets. He goes to bed between 10 and11pm. Feels like his voice is getting lower. No problems with sleeping or vivid dreams. No falls. Sense of smell and taste is ok. No dysphagia. No memory changes. No hallucinations. No falls. No drooling. Noticed slight tremor in the right hand by the end of the appointment.   Vould try taking the Sinemet every 4 hours. 8am, noon, 4pm and 8pm. In the morning his handwriting is worse but he takes the sinemet and that is fine, he doesn't need a long-acting at night. But right  Interval update 04/26/2015:He stopped the Sinemet 2 weeks ago. Says it didn't help. Explained that we may need to increase it. Discussed in depth the plan to slowly increase the Sinemet, that this is medication for PD but we need to increase slowly. If it didn't help, dose was too low and we had plans to increase.  No side effects.    Visit 3/3: Kyle Mcguire is a 79 y.o. male here as a follow up.   Azilect not helping with the tremor and making him dizzy. Otherwise symptoms are the same.   Reviewed notes, labs and  imaging from outside physicians, which showed:  MR Brsain 12/2014: This is a borderline normal MRI of the brain without contrast showing a mild extent of white matter foci that are likely due to age-related microvascular change. There is associated minimal atrophy. There are no acute findings.  Initial visit 12/27/2014: Kyle Mcguire is a 79 y.o. male with no significant PMHx who is here as a consult from Dr. Jacelyn Grip for Tremors. Tremors started 8-9 months ago. Started in the right hand. He noticed it when he tried to write, penmanship was smaller and shaky. Staying about the same. Left arm is fine. Walking fine. Sense of smell ok. Has constipation. He just moved here from [place]. Lives in a town house. Plays golf, lives independently. Can't read his writing anymore. No stiffness. Father had a tremor. Balance is getting poor, if he bends down he may fall over. Vision is changing, "hazy" vision when driving at night. Worked in trucking for 33 years. He can't pick up as much as he used to. He is very forgetful. He will forget recent conversations, his wife will tell him something and a few hours later he may forget. Dad with Parkinson's Disease. Tremor is mostly at rest or when writing. Was walking the dog and the dog went quickly and he couldn't stop himself from falling down, and broke his wrist. Sometimes when he speeds up he can't stop, has to "stutter step" to turn. No neck pain. He takes daily aspirin, no history of stroke or TIA. Denies DM or heart disease. He takes no medication. He does not drink alcohol and never smoked.   Reviewed notes, labs and imaging from outside physicians, which showed: Was sent from [facility] for evaluation of tremor. CMP wnl, LDL 98,   MRI of the brain: This is  a borderline normal MRI of the brain without contrast showing a mild extent of white matter foci that are likely due to age-related microvascular change. There is associated minimal atrophy. There are no acute findings.  Review of Systems: Patient complains of symptoms per HPI as well as the following symptoms: back pain, cyst . Pertinent negatives and positives per HPI. All others negative    Social History   Socioeconomic History  . Marital status: Married    Spouse name: [name]   . Number of children: 2  . Years of education: 25  . Highest education level: Not on file  Occupational History  . Occupation: Retired   Tobacco Use  . Smoking status: Never Smoker  . Smokeless tobacco: Never Used  Substance and Sexual Activity  . Alcohol use: No    Alcohol/week: 0.0 standard drinks  . Drug use: No  . Sexual activity: Not on file  Other Topics Concern  . Not on file  Social History Narrative   Patient lives at home with wife [name]   Patient has 2 children    Patient has a high school education    Patient is right handed    Patient is retired    Caffeine use: 1 cup coffee (not daily)   Social Determinants of Radio broadcast assistant Strain:   . Difficulty of Paying Living Expenses:   Food Insecurity:   . Worried About Charity fundraiser in the Last Year:   . Arboriculturist in the Last Year:   Transportation Needs:   . Film/video editor (Medical):   [name] Lack of Transportation (Non-Medical):   Physical Activity:   . Days of  Exercise per Week:   . Minutes of Exercise per Session:   Stress:   . Feeling of Stress :   Social Connections:   . Frequency of Communication with Friends and Family:   . Frequency of Social Gatherings with Friends and Family:   . Attends Religious Services:   . Active Member of Clubs or Organizations:   . Attends Archivist Meetings:   [name] Marital Status:   Intimate Partner Violence:   . Fear of Current or Ex-Partner:   .  Emotionally Abused:   [name] Physically Abused:   . Sexually Abused:     Family History  Problem Relation Age of Onset  . Parkinsonism Father     Past Medical History:  Diagnosis Date  . Dementia ([place])   . Hearing loss   . Parkinson's disease [facility])     Past Surgical History:  Procedure Laterality Date  . no surgical history per patient    . None      Current Outpatient Medications  Medication Sig Dispense Refill  . aspirin 81 MG chewable tablet Chew 81 mg by mouth daily.    . carbidopa-levodopa (SINEMET IR) 25-100 MG tablet TAKE 1 TABLET BY MOUTH FOUR TIMES DAILY 120 tablet 11  . FIBER PO Take 1 tablet by mouth at bedtime.    . methocarbamol (ROBAXIN) 500 MG tablet Take 1 tablet (500 mg total) by mouth every 8 (eight) hours as needed for muscle spasms. 10 tablet 0  . rivastigmine (EXELON) 4.6 mg/24hr APPLY 1 PATCH(4.6 MG) EXTERNALLY TO THE SKIN DAILY 30 patch 2   No current facility-administered medications for this visit.    Allergies as of 05/16/2020  . (No Known Allergies)    Vitals: There were no vitals taken for this visit. Last Weight:  Wt Readings from Last 1 Encounters:  04/13/20 197 lb (89.4 kg)   Last Height:   Ht Readings from Last 1 Encounters:  04/13/20 5\' 5"  (1.651 m)   MMSE - Mini Mental State Exam 09/13/2018 08/25/2017  Orientation to time 3 3  Orientation to Place 4 5  Registration 3 3  Attention/ Calculation 5 3  Recall 1 1  Language- name 2 objects 2 2  Language- repeat 0 0  Language- follow 3 step command 3 3  Language- read & follow direction 1 1  Write a sentence 0 1  Copy design 0 0  Total score 22 22   PRIOR EXAM, today is on the phone  Cranial Nerves: decreased blink, hypomimia  The pupils are equal, round, and reactive to light. Visual fields are full to finger confrontation. Extraocular movements are intact. Trigeminal sensation is intact and the muscles of mastication are normal. The face is symmetric. The palate elevates in the  midline. Reduced hearing bilat. Voice is normal. Shoulder shrug is normal. The tongue has normal motion without fasciculations.   Coordination:  Normal rapid alternating movements. 2-3 steps back with push test  Gait:  Reduced arm swing. Minimally shuffling.   Motor Observation:  Mild resting tremor with Intention and postural components that is improved today since he is on the Sinemet Tone:  cogwheeling uppers with facilitation improved  Posture:  Mildly stooped   Strength:  Strength is V/V in the upper and lower limbs.   MMSE - Mini Mental State Exam 09/13/2018 08/25/2017  Orientation to time 3 3  Orientation to Place 4 5  Registration 3 3  Attention/ Calculation 5 3  Recall 1 1  Language- name  2 objects 2 2  Language- repeat 0 0  Language- follow 3 step command 3 3  Language- read & follow direction 1 1  Write a sentence 0 1  Copy design 0 0  Total score 22 22    Assessment/Plan: 79 year old wonderful male telephone follow up  of tremor, worsening penmanship, balance problems, falls, memory changes. Exam shows parkinsonian symptoms, mild shuffling and reduced arm swing, cogwheeling with facilitation, resting tremor(improved with sinemet). MRI of the brain unremarkable. Patient has been orthostatic in the office, Advised to drink more water. We discontinued Florinef because his blood pressure became too elevated.   Parkinson's disease:Stable. Continue Sinemet. 1 pills four time a day (8am, 11am, 2pm and 5pm for example). Space 3-4 hours apart. Try to separate Sinemet from food (especially protein-rich foods like meat, dairy, eggs) by about 30-60 mins - this will help the absorption of the medication. If you have some nausea with the medication, you can take it with some light food like crackers or ginger ale. Power over PD group, information given. Document when he noticed the medication wearing off.  Dizziness: Orthostatic hypotension.IMPROVED WITH  MORE WATER INTAKE and B12 injections. Denies dizziness today. Started drinking more fluids sparkling water and less pepsi. Improved.  (LAST VISIT: Dizziness  can be due to hypovolemia but is also common in Parkinson's disease and with Sinemet i.e. may be neurogenic in nature.-drink more water. Keep a fluids diary. Her wife said not hydrating only drinking several cokes a day. He feels better with increased hydration- orthostatic vitals showed orthostatic vs. neurogenic hypotension.. Could be secondary to medication, dehydration and can also ne seen in PD. IMPROVED WITH MORE WATER INTAKE.- If orthostatic dizziness worsens can consider Northera. Today he feels improved, will not start now.)    Snoring, daytime somnolence, ESS 12: Sleep eval showed no clinically significant sleep apnea. 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 stable  Mild dementia: Worsening slowly. 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 Aricept with diarrhea, continue Exelon Patch. Called pharmacy, will be $15 a month and called them back, they are ok with this. Continue. Stable.   Sarina Ill, MD  [facility] [address] [place], [place] [address]  Phone [phone] Fax [phone]

## 2020-05-16 NOTE — Telephone Encounter (Signed)
Pt wife called to inform they would like to move forward with a phone apt and provided the best CB# [phone]

## 2020-05-20 DIAGNOSIS — L03115 Cellulitis of right lower limb: Secondary | ICD-10-CM | POA: Diagnosis not present

## 2020-05-20 DIAGNOSIS — R609 Edema, unspecified: Secondary | ICD-10-CM | POA: Diagnosis not present

## 2020-05-20 DIAGNOSIS — Z6831 Body mass index (BMI) 31.0-31.9, adult: Secondary | ICD-10-CM | POA: Diagnosis not present

## 2020-05-23 ENCOUNTER — Other Ambulatory Visit: Payer: Self-pay

## 2020-05-23 ENCOUNTER — Encounter: Payer: Self-pay | Admitting: Physical Therapy

## 2020-05-23 ENCOUNTER — Ambulatory Visit: Payer: PPO | Admitting: Physical Therapy

## 2020-05-23 DIAGNOSIS — R296 Repeated falls: Secondary | ICD-10-CM | POA: Diagnosis not present

## 2020-05-23 DIAGNOSIS — M545 Low back pain, unspecified: Secondary | ICD-10-CM

## 2020-05-23 DIAGNOSIS — R2681 Unsteadiness on feet: Secondary | ICD-10-CM

## 2020-05-23 NOTE — Therapy (Signed)
[place] [address] [place], [place], [address] Phone: [phone]   Fax:  [phone]  Physical Therapy Treatment  Patient Details  Name: Kyle Mcguire MRN: [id number] Date of Birth: Oct 15, 1941 Referring Provider (PT): Jaynee Eagles   Encounter Date: 05/23/2020  PT End of Session - 05/23/20 1055    Visit Number  5    Date for PT Re-Evaluation  06/23/20    PT Start Time  1013    PT Stop Time  1055    PT Time Calculation (min)  42 min    Activity Tolerance  Patient tolerated treatment well    Behavior During Therapy  [facility] for tasks assessed/performed       Past Medical History:  Diagnosis Date  . Dementia ([place])   . Hearing loss   . Parkinson's disease [facility])     Past Surgical History:  Procedure Laterality Date  . no surgical history per patient    . None      There were no vitals filed for this visit.  Subjective Assessment - 05/23/20 1017    Subjective  No falls but has come close.    Currently in Pain?  No/denies                        [facility] Adult PT Treatment/Exercise - 05/23/20 0001      High Level Balance   High Level Balance Activities  Side stepping;Backward walking;Figure 8 turns    High Level Balance Comments  Alt 6 in box taps, alt 8 in box taps from airex,  side step over half pool noodle,       Knee/Hip Exercises: Aerobic   Recumbent Bike  L0 x 5 min     Nustep  Level 5 x 6 minutes      Knee/Hip Exercises: Machines for Strengthening   Cybex Knee Extension  5# 2x10    Cybex Knee Flexion  25lb 2x10     Cybex Leg Press  30lb 3x10                   PT Long Term Goals - 05/16/20 1148      PT LONG TERM GOAL #4   Title  demonstrate a transfer from one sitting surface to another 10 feet away with no shuffling pattern noted    Status  Partially Met            Plan - 05/23/20 1055    Clinical Impression Statement  Small stutter step remains when pt is  required to turn. Cues needed to increase step length with LLE doing backwards walking. Some instability noted today with alt box taps when standing on airex. Added more LE strengthening interventions today, pt unable to extend LE with seated leg extensions despite cues.    Comorbidities  orthostatic hypotension, Parkinsons dx, dementia    Examination-Activity Limitations  Locomotion Level    Stability/Clinical Decision Making  Stable/Uncomplicated    Rehab Potential  Good    PT Frequency  1x / week    PT Duration  8 weeks    PT Treatment/Interventions  Gait training;Stair training;Functional mobility training;Patient/family education;Taping;Therapeutic activities;Passive range of motion;Therapeutic exercise;Balance training;Neuromuscular re-education;Manual techniques;Moist Heat;Cryotherapy;Electrical Stimulation    PT Next Visit Plan  really focus on balance and safety with walking       Patient will benefit from skilled therapeutic intervention in order to improve the following deficits and impairments:  Decreased range of motion, Difficulty walking, Impaired flexibility, Decreased balance, Abnormal gait, Decreased coordination, Pain, Decreased mobility, Decreased strength  Visit Diagnosis: Repeated falls  Unsteady gait  Acute bilateral low back pain without sciatica     Problem List Patient Active Problem List   Diagnosis Date Noted  . Parkinson's disease dementia ([place]) 03/17/2019  . 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 Snoring 09/21/2017  . Memory loss 08/26/2017  . 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  . Parkinson's disease ([place]) 12/31/2014    Scot Jun, PTA 05/23/2020, 10:58 AM  [place] [address] [place], [place], [address] Phone: [phone]   Fax:  [phone]  Name: Kyle Mcguire MRN: [id number] Date of Birth: Jul 19, 1941

## 2020-05-30 ENCOUNTER — Encounter: Payer: Self-pay | Admitting: Physical Therapy

## 2020-05-30 ENCOUNTER — Other Ambulatory Visit: Payer: Self-pay

## 2020-05-30 ENCOUNTER — Ambulatory Visit: Payer: PPO | Admitting: Physical Therapy

## 2020-05-30 DIAGNOSIS — R296 Repeated falls: Secondary | ICD-10-CM

## 2020-05-30 DIAGNOSIS — M545 Low back pain, unspecified: Secondary | ICD-10-CM

## 2020-05-30 DIAGNOSIS — R2681 Unsteadiness on feet: Secondary | ICD-10-CM

## 2020-05-30 NOTE — Therapy (Signed)
[place], [place], [address] Phone: [phone]   Fax:  [phone]  Physical Therapy Treatment  Patient Details  Name: Kyle Mcguire MRN: [id number] Date of Birth: 1941-09-27 Referring Provider (PT): Jaynee Eagles   Encounter Date: 05/30/2020   PT End of Session - 05/30/20 1052    Visit Number 6    Number of Visits 12    Date for PT Re-Evaluation 06/23/20    PT Start Time 1006    PT Stop Time 1053    PT Time Calculation (min) 47 min    Activity Tolerance Patient tolerated treatment well    Behavior During Therapy [facility] for tasks assessed/performed           Past Medical History:  Diagnosis Date  . Dementia ([place])   . Hearing loss   . Parkinson's disease [facility])     Past Surgical History:  Procedure Laterality Date  . no surgical history per patient    . None      There were no vitals filed for this visit.   Subjective Assessment - 05/30/20 1008    Subjective No falls and feeling good today    Currently in Pain? No/denies    Pain Score 0-No pain                             [facility] Adult PT Treatment/Exercise - 05/30/20 0001      High Level Balance   High Level Balance Activities Side stepping;Backward walking;Turns    High Level Balance Comments alt 6 in box taps no HH assist, agility ladder sidestepping/fwd/bkwd      Knee/Hip Exercises: Aerobic   Nustep level 5 x 8 min      Knee/Hip Exercises: Machines for Strengthening   Cybex Knee Extension 5# 2x10    Cybex Knee Flexion 25lb 2x10     Cybex Leg Press 40# 3x10      Knee/Hip Exercises: Seated   Sit to Sand 10 reps;2 sets;without UE support   wt ball with overhead raise                      PT Long Term Goals - 05/16/20 1148      PT LONG TERM GOAL #4   Title demonstrate a transfer from one sitting surface to another 10 feet away with no shuffling pattern noted    Status Partially Met                   Plan - 05/30/20 1052    Clinical Impression Statement Pt required cues for increasing step length with backwards walking and sidestepping. Pt required visual/tactile cues for leg extension/curls. Pt demonstrated some freezing/shuffling steps with turns; able to self correct. CGA for most balance activities.    PT Treatment/Interventions Gait training;Stair training;Functional mobility training;Patient/family education;Taping;Therapeutic activities;Passive range of motion;Therapeutic exercise;Balance training;Neuromuscular re-education;Manual techniques;Moist Heat;Cryotherapy;Electrical Stimulation    PT Next Visit Plan really focus on balance and safety with walking    Consulted and Agree with Plan of Care Patient           Patient will benefit from skilled therapeutic intervention in order to improve the following deficits and impairments:  Decreased range of motion, Difficulty walking, Impaired flexibility, Decreased balance, Abnormal gait, Decreased coordination, Pain, Decreased mobility, Decreased strength  Visit Diagnosis: Repeated falls  Unsteady gait  Acute bilateral low back pain  without sciatica     Problem List Patient Active Problem List   Diagnosis Date Noted  . 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 Snoring 09/21/2017  . Memory loss 08/26/2017  . 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  . Parkinson's disease ([place]) 12/31/2014   Kyle Mcguire, PT, DPT Arshan Prose Evangelia Whitaker 05/30/2020, 10:56 AM  [place] [address] [place], [place], [address] Phone: [phone]   Fax:  [phone]  Name: Kyle Mcguire MRN: [id number] Date of Birth: 23-Nov-1941

## 2020-06-19 DIAGNOSIS — E538 Deficiency of other specified B group vitamins: Secondary | ICD-10-CM | POA: Diagnosis not present

## 2020-07-17 DIAGNOSIS — E538 Deficiency of other specified B group vitamins: Secondary | ICD-10-CM | POA: Diagnosis not present

## 2020-07-23 ENCOUNTER — Other Ambulatory Visit: Payer: Self-pay | Admitting: Family Medicine

## 2020-07-23 ENCOUNTER — Other Ambulatory Visit: Payer: Self-pay

## 2020-07-23 ENCOUNTER — Ambulatory Visit
Admission: RE | Admit: 2020-07-23 | Discharge: 2020-07-23 | Disposition: A | Discharge status: Home / Self Care | Payer: PPO | Source: Ambulatory Visit | Attending: Family Medicine | Admitting: Family Medicine

## 2020-07-23 DIAGNOSIS — M19012 Primary osteoarthritis, left shoulder: Secondary | ICD-10-CM | POA: Diagnosis not present

## 2020-07-23 DIAGNOSIS — S4992XA Unspecified injury of left shoulder and upper arm, initial encounter: Secondary | ICD-10-CM | POA: Diagnosis not present

## 2020-07-23 DIAGNOSIS — S20212A Contusion of left front wall of thorax, initial encounter: Secondary | ICD-10-CM | POA: Diagnosis not present

## 2020-07-23 DIAGNOSIS — S299XXA Unspecified injury of thorax, initial encounter: Secondary | ICD-10-CM | POA: Diagnosis not present

## 2020-07-23 DIAGNOSIS — W19XXXA Unspecified fall, initial encounter: Secondary | ICD-10-CM

## 2020-07-23 DIAGNOSIS — S40012A Contusion of left shoulder, initial encounter: Secondary | ICD-10-CM | POA: Diagnosis not present

## 2020-07-30 ENCOUNTER — Telehealth: Payer: Self-pay | Admitting: Neurology

## 2020-07-30 DIAGNOSIS — G20 Parkinson's disease: Secondary | ICD-10-CM

## 2020-07-30 DIAGNOSIS — R600 Localized edema: Secondary | ICD-10-CM | POA: Diagnosis not present

## 2020-07-30 NOTE — Telephone Encounter (Signed)
That's fine. thanks

## 2020-07-30 NOTE — Telephone Encounter (Signed)
Cane order placed. Will fax to [facility].

## 2020-07-30 NOTE — Telephone Encounter (Signed)
Pt's wife called wanting to know if provider can put in a prescription for a cane to [facility] in [place]. Please advise.

## 2020-07-30 NOTE — Telephone Encounter (Signed)
Cane order faxed to [facility] at [phone]. Received a receipt of confirmation. I called pt's wife back to let her know. She provided me a different fax number so I sent it there as well. [phone]. Received a receipt of confirmation.

## 2020-07-31 DIAGNOSIS — S40012A Contusion of left shoulder, initial encounter: Secondary | ICD-10-CM | POA: Diagnosis not present

## 2020-07-31 DIAGNOSIS — R600 Localized edema: Secondary | ICD-10-CM | POA: Diagnosis not present

## 2020-08-02 DIAGNOSIS — G20 Parkinson's disease: Secondary | ICD-10-CM | POA: Diagnosis not present

## 2020-08-09 DIAGNOSIS — H2513 Age-related nuclear cataract, bilateral: Secondary | ICD-10-CM | POA: Diagnosis not present

## 2020-08-23 DIAGNOSIS — E538 Deficiency of other specified B group vitamins: Secondary | ICD-10-CM | POA: Diagnosis not present

## 2020-08-31 DIAGNOSIS — Z23 Encounter for immunization: Secondary | ICD-10-CM | POA: Diagnosis not present

## 2020-08-31 DIAGNOSIS — R600 Localized edema: Secondary | ICD-10-CM | POA: Diagnosis not present

## 2020-08-31 DIAGNOSIS — E538 Deficiency of other specified B group vitamins: Secondary | ICD-10-CM | POA: Diagnosis not present

## 2020-09-18 ENCOUNTER — Ambulatory Visit: Payer: Self-pay | Attending: Internal Medicine

## 2020-09-18 DIAGNOSIS — Z23 Encounter for immunization: Secondary | ICD-10-CM

## 2020-09-18 NOTE — Progress Notes (Signed)
   Covid-19 Vaccination Clinic  Name:  Kyle Mcguire    MRN: [id number] DOB: [birth year]  09/18/2020  [name] was observed post Covid-19 immunization for 15 minutes without incident. He was provided with Vaccine Information Sheet and instruction to access the V-Safe system.   [name] was instructed to call 911 with any severe reactions post vaccine: [name] Difficulty breathing  . Swelling of face and throat  . A fast heartbeat  . A bad rash all over body  . Dizziness and weakness

## 2020-09-18 NOTE — Progress Notes (Signed)
   Covid-19 Vaccination Clinic  Name:  Kyle Mcguire    MRN: [id number] DOB: September 19, 1941  09/18/2020  Kyle Mcguire was observed post Covid-19 immunization for 15 minutes without incident. He was provided with Vaccine Information Sheet and instruction to access the V-Safe system.   Kyle Mcguire was instructed to call 911 with any severe reactions post vaccine: [name] Difficulty breathing  . Swelling of face and throat  . A fast heartbeat  . A bad rash all over body  . Dizziness and weakness

## 2020-09-21 DIAGNOSIS — E538 Deficiency of other specified B group vitamins: Secondary | ICD-10-CM | POA: Diagnosis not present

## 2020-11-22 DIAGNOSIS — E538 Deficiency of other specified B group vitamins: Secondary | ICD-10-CM | POA: Diagnosis not present

## 2020-12-24 DIAGNOSIS — D510 Vitamin B12 deficiency anemia due to intrinsic factor deficiency: Secondary | ICD-10-CM | POA: Diagnosis not present

## 2021-01-15 ENCOUNTER — Telehealth: Payer: Self-pay | Admitting: Neurology

## 2021-01-15 NOTE — Telephone Encounter (Signed)
Pt's wife would like to know when Dr Jaynee Eagles would like pt to come back for a f/u

## 2021-01-16 NOTE — Telephone Encounter (Signed)
Spoke with pt's wife [name] (on [place]). She said physically patient is doing well (eating, drinking, sleeping, toileting, dresses himself) and his doing well with his parkinson's. However his memory is worse. He was last seen June 2021. I let her know I would see how soon an appt is needed and give her a call back. She was very Patent attorney.

## 2021-01-16 NOTE — Telephone Encounter (Signed)
Pt's wife returned call to [facility].  Wife was asked how pt is doing, she would like a call from RN to discuss.

## 2021-01-16 NOTE — Telephone Encounter (Signed)
Spoke with pt's wife. She prefers to wait until next available with Dr Jaynee Eagles. Pt scheduled for April 27th at 3 pm arrival 230. She verbalized appreciation for the call.

## 2021-01-16 NOTE — Telephone Encounter (Signed)
I called Kyle Mcguire back and LVM asking for a call back to see how pt is doing and then I will check with Dr Jaynee Eagles and call her back.

## 2021-01-16 NOTE — Telephone Encounter (Signed)
Tried to call pt's wife back but it went to VM. I LVM and stated I would try to call her again this afternoon.

## 2021-01-16 NOTE — Telephone Encounter (Signed)
This is not a new problem, he is already on the Exelon Patch.  If Jinny Blossom has anything open he can see her sooner.

## 2021-01-31 DIAGNOSIS — D510 Vitamin B12 deficiency anemia due to intrinsic factor deficiency: Secondary | ICD-10-CM | POA: Diagnosis not present

## 2021-02-28 DIAGNOSIS — E538 Deficiency of other specified B group vitamins: Secondary | ICD-10-CM | POA: Diagnosis not present

## 2021-03-26 ENCOUNTER — Other Ambulatory Visit: Payer: Self-pay | Admitting: *Deleted

## 2021-03-26 MED ORDER — CARBIDOPA-LEVODOPA 25-100 MG PO TABS: 1.0000 | ORAL_TABLET | Freq: Four times a day (QID) | ORAL | 0 refills | Status: DC

## 2021-03-28 DIAGNOSIS — Z1389 Encounter for screening for other disorder: Secondary | ICD-10-CM | POA: Diagnosis not present

## 2021-03-28 DIAGNOSIS — E538 Deficiency of other specified B group vitamins: Secondary | ICD-10-CM | POA: Diagnosis not present

## 2021-03-28 DIAGNOSIS — Z0000 Encounter for general adult medical examination without abnormal findings: Secondary | ICD-10-CM | POA: Diagnosis not present

## 2021-04-01 ENCOUNTER — Telehealth: Payer: Self-pay | Admitting: Neurology

## 2021-04-01 NOTE — Telephone Encounter (Signed)
Thanks, a refill was actually sent to [facility] on 03/26/21.

## 2021-04-01 NOTE — Telephone Encounter (Signed)
Pt's wife, Sigurd Pugh (on [place]) called, changed insurance to [employer]. We would receive medication through mail order for carbidopa-levodopa (SINEMET IR) 25-100 MG tablet.  New insurance info: [employer] Member ID: [id number] Group ID: [id number] BIN: [id number] PCN: [id number]

## 2021-04-08 DIAGNOSIS — G20 Parkinson's disease: Secondary | ICD-10-CM | POA: Diagnosis not present

## 2021-04-08 DIAGNOSIS — Z5181 Encounter for therapeutic drug level monitoring: Secondary | ICD-10-CM | POA: Diagnosis not present

## 2021-04-08 DIAGNOSIS — D510 Vitamin B12 deficiency anemia due to intrinsic factor deficiency: Secondary | ICD-10-CM | POA: Diagnosis not present

## 2021-04-08 DIAGNOSIS — R739 Hyperglycemia, unspecified: Secondary | ICD-10-CM | POA: Diagnosis not present

## 2021-04-10 ENCOUNTER — Ambulatory Visit: Payer: Self-pay | Admitting: Neurology

## 2021-04-11 ENCOUNTER — Encounter: Payer: Self-pay | Admitting: Family Medicine

## 2021-04-11 ENCOUNTER — Ambulatory Visit: Payer: Medicare HMO | Admitting: Family Medicine

## 2021-04-11 VITALS — BP 113/67 | HR 53 | Ht 65.0 in | Wt 188.0 lb

## 2021-04-11 DIAGNOSIS — R2689 Other abnormalities of gait and mobility: Secondary | ICD-10-CM | POA: Diagnosis not present

## 2021-04-11 DIAGNOSIS — G20 Parkinson's disease: Secondary | ICD-10-CM

## 2021-04-11 DIAGNOSIS — F0280 Dementia in other diseases classified elsewhere without behavioral disturbance: Secondary | ICD-10-CM

## 2021-04-11 MED ORDER — RIVASTIGMINE 9.5 MG/24HR TD PT24
9.5000 mg | MEDICATED_PATCH | Freq: Every day | TRANSDERMAL | 12 refills | Status: DC
Start: 2021-04-11 — End: 2021-04-29

## 2021-04-11 NOTE — Progress Notes (Addendum)
Chief Complaint  Patient presents with  . Follow-up    RM 1 with wife (monique) Pt is having worsening memory and parkinson per wife      HISTORY OF PRESENT ILLNESS: 04/11/21 ALL:  Kyle Mcguire is a 80 y.o. male here today for follow up for PD with dementia. He continues Sinemet QID and Exelon 4.6mg  daily. He feels that balance is worsening. He has a cane but does not use. He worked with PT years ago but did not feel it was helpful. No pain. He continues kickboxing twice a week. Memory seems to be declining. He has a harder time finding words. He is able to perform ADLs. He is eating normally. He is sleeping more. He sets reminder on his phone to help with medication administration. Insurance no longer covering Exelon patch. He has tried and failed Aricept. Aricept caused significant diarrhea.    HISTORY (copied from Dr Cathren Laine previous note)  05/16/2020: No complaints of dizziness. His wife provided much information. He is going for b12 shots and feeling better. He is getting injections. No falls, new symptoms, stable. He is even behaving himself. He is walking every day now, little walks. He is doing well. Stable with sinemet. Taking the Exelon Patch. He was having back problems, he has degenerative disk disease, he is in physical therapy, he has been to [facility]. He will restart the [facility] after that. He says he is feeling well, he is walking, he is going to PT, he feels stable, no falls. He mows his son's lawn weekly.    REVIEW OF SYSTEMS: Out of a complete 14 system review of symptoms, the patient complains only of the following symptoms, gait instability, memory loss, and all other reviewed systems are negative.    ALLERGIES: No Known Allergies   HOME MEDICATIONS: Outpatient Medications Prior to Visit  Medication Sig Dispense Refill  . aspirin 81 MG chewable tablet Chew 81 mg by mouth daily.    . carbidopa-levodopa (SINEMET IR) 25-100 MG tablet Take 1 tablet by mouth  4 (four) times daily. 360 tablet 0  . FIBER PO Take 1 tablet by mouth at bedtime.    . rivastigmine (EXELON) 4.6 mg/24hr APPLY 1 PATCH(4.6 MG) EXTERNALLY TO THE SKIN DAILY 90 patch 3  . methocarbamol (ROBAXIN) 500 MG tablet Take 1 tablet (500 mg total) by mouth every 8 (eight) hours as needed for muscle spasms. 10 tablet 0   No facility-administered medications prior to visit.     PAST MEDICAL HISTORY: Past Medical History:  Diagnosis Date  . Dementia ([place])   . Hearing loss   . Parkinson's disease ([place])      PAST SURGICAL HISTORY: Past Surgical History:  Procedure Laterality Date  . no surgical history per patient    . None       FAMILY HISTORY: Family History  Problem Relation Age of Onset  . Parkinsonism Father      SOCIAL HISTORY: Social History   Socioeconomic History  . Marital status: Married    Spouse name: [name]   . Number of children: 2  . Years of education: 24  . Highest education level: Not on file  Occupational History  . Occupation: Retired   Tobacco Use  . Smoking status: Never Smoker  . Smokeless tobacco: Never Used  Vaping Use  . Vaping Use: Never used  Substance and Sexual Activity  . Alcohol use: No    Alcohol/week: 0.0 standard drinks  . Drug use:  No  . Sexual activity: Not on file  Other Topics Concern  . Not on file  Social History Narrative   Patient lives at home with wife [name]   Patient has 2 children    Patient has a high school education    Patient is right handed    Patient is retired    Caffeine use: 1 cup coffee (not daily)   Social Determinants of Radio broadcast assistant Strain: Not on file  Food Insecurity: Not on file  Transportation Needs: Not on file  Physical Activity: Not on file  Stress: Not on file  Social Connections: Not on file  Intimate Partner Violence: Not on file      PHYSICAL EXAM  Vitals:   04/11/21 1535  BP: 113/67  Pulse: (!) 53  Weight: 188 lb (85.3 kg)  Height: 5\' 5"  (1.651  m)   Body mass index is 31.28 kg/m.   Generalized: Well developed, in no acute distress  Cardiology: normal rate and rhythm, no murmur auscultated  Respiratory: clear to auscultation bilaterally    Neurological examination  Mentation: Alert not oriented to time, he is oriented to place and some history taking. Follows all commands speech and language fluent Cranial nerve II-XII: Pupils were equal round reactive to light. Extraocular movements were full, visual field were full on confrontational test. Facial sensation and strength were normal. Head turning and shoulder shrug  were normal and symmetric. Motor: The motor testing reveals 5 over 5 strength of all 4 extremities. Good symmetric motor tone is noted throughout.  Sensory: Sensory testing is intact to soft touch on all 4 extremities. No evidence of extinction is noted.  Coordination: Cerebellar testing reveals good finger-nose-finger and heel-to-shin bilaterally.  Gait and station: Gait is   Reflexes: Deep tendon reflexes are symmetric and normal bilaterally.     DIAGNOSTIC DATA (LABS, IMAGING, TESTING) - I reviewed patient records, labs, notes, testing and imaging myself where available.  Lab Results  Component Value Date   WBC 7.2 04/13/2020   HGB 17.4 (H) 04/13/2020   HCT 52.1 (H) 04/13/2020   MCV 95.6 04/13/2020   PLT 256 04/13/2020      Component Value Date/Time   NA 141 04/13/2020 1203   NA 141 02/18/2016 1303   K 4.2 04/13/2020 1203   CL 105 04/13/2020 1203   CO2 25 04/13/2020 1203   GLUCOSE 161 (H) 04/13/2020 1203   BUN 19 04/13/2020 1203   BUN 15 02/18/2016 1303   CREATININE 0.95 04/13/2020 1203   CALCIUM 9.4 04/13/2020 1203   GFRNONAA >60 04/13/2020 1203   GFRAA >60 04/13/2020 1203   No results found for: CHOL, HDL, LDLCALC, LDLDIRECT, TRIG, CHOLHDL No results found for: HGBA1C No results found for: VITAMINB12 No results found for: TSH  MMSE - Mini Mental State Exam 04/11/2021 09/13/2018 08/25/2017   Orientation to time 1 3 3   Orientation to Place 2 4 5   Registration 3 3 3   Attention/ Calculation 3 5 3   Recall 0 1 1  Language- name 2 objects 2 2 2   Language- repeat 0 0 0  Language- follow 3 step command 3 3 3   Language- read & follow direction 1 1 1   Write a sentence 1 0 1  Copy design 0 0 0  Total score 16 22 22      No flowsheet data found.   ASSESSMENT AND PLAN  80 y.o. year old male  has a past medical history of Dementia ([place]), Hearing loss,  and Parkinson's disease ([place]). 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 He and his wife have noted some progression of memory loss and worsening balance. MMSE 16/30, previously 22/30. He has tolerated Exelon very well. We will increase dose to 9.5mg  daily. He was unable to tolerate Aricept in the past due to significant diarrhea and weight loss. He will continue Sinemet 25-100mg  four times daily. Was unable to tolerate higher doses in the past due to low blood pressures. We will order home health for eval and treatment. I would like for him to work with PT, OT and [place]. I have encouraged him to continue [employer]. Regular exercise is so beneficial. He will continue close follow up with PCP. His wife requests follow up with Dr Jaynee Eagles. I recommend 6 months.    Orders Placed This Encounter  Procedures  . Ambulatory referral to Home Health    Referral Priority:   Routine    Referral Type:   Home Health Care    Referral Reason:   Specialty Services Required    Requested Specialty:   [place]    Number of Visits Requested:   1     Meds ordered this encounter  Medications  . rivastigmine (EXELON) 9.5 mg/24hr    Sig: Place 1 patch (9.5 mg total) onto the skin daily.    Dispense:  30 patch    Refill:   12    Order Specific Question:   Supervising Provider    Answer:   [name] [id number]      I spent 30 minutes of face-to-face and non-face-to-face time with patient.  This included previsit chart review, lab review, study review, order entry, electronic health record documentation, patient education.    Debbora Presto, MSN, FNP-C 04/11/2021, 4:18 PM  [facility] [address], [place], [place] [address] [phone]  agree with assessment and plan as stated.     Sarina Ill, MD [facility]

## 2021-04-11 NOTE — Patient Instructions (Signed)
Below is our plan:  We will continue Sinemet 25-100mg  four times daily. I will increase Exelon patch to 9.5mg  daily. If not covered, we will consider Namenda. I will also order home health for evaluation. I would like for you to work with PT and speech therapy for cognitive support. Please continue regular activity.   Please make sure you are staying well hydrated. I recommend 50-60 ounces daily. Well balanced diet and regular exercise encouraged. Consistent sleep schedule with 6-8 hours recommended.   Please continue follow up with care team as directed.   Follow up with Dr Jaynee Eagles in 6 months   You may receive a survey regarding today's visit. I encourage you to leave honest feed back as I do use this information to improve patient care. Thank you for seeing me today!      Management of Memory Problems   There are some general things you can do to help manage your memory problems.  Your memory may not in fact recover, but by using techniques and strategies you will be able to manage your memory difficulties better.   1)  Establish a routine. ? Try to establish and then stick to a regular routine.  By doing this, you will get used to what to expect and you will reduce the need to rely on your memory.  Also, try to do things at the same time of day, such as taking your medication or checking your calendar first thing in the morning. ? Think about think that you can do as a part of a regular routine and make a list.  Then enter them into a daily planner to remind you.  This will help you establish a routine.   2)  Organize your environment. ? Organize your environment so that it is uncluttered.  Decrease visual stimulation.  Place everyday items such as keys or cell phone in the same place every day (ie.  Basket next to front door) ? Use post it notes with a brief message to yourself (ie. Turn off light, lock the door) ? Use labels to indicate where things go (ie. Which cupboards are for  food, dishes, etc.) ? Keep a notepad and pen by the telephone to take messages   3)  Memory Aids ? A diary or journal/notebook/daily planner ? Making a list (shopping list, chore list, to do list that needs to be done) ? Using an alarm as a reminder (kitchen timer or cell phone alarm) ? Using cell phone to store information (Notes, Calendar, Reminders) ? Calendar/White board placed in a prominent position ? Post-it notes   In order for memory aids to be useful, you need to have good habits.  It's no good remembering to make a note in your journal if you don't remember to look in it.  Try setting aside a certain time of day to look in journal.   4)  Improving mood and managing fatigue. 1. There may be other factors that contribute to memory difficulties.  Factors, such as anxiety, depression and tiredness can affect memory.  Regular gentle exercise can help improve your mood and give you more energy.  Simple relaxation techniques may help relieve symptoms of anxiety  Try to get back to completing activities or hobbies you enjoyed doing in the past.  Learn to pace yourself through activities to decrease fatigue.  Find out about some local support groups where you can share experiences with others.  Try and achieve 7-8 hours of sleep at night.  Parkinson's Disease Parkinson's disease causes problems with movements. It is a long-term condition. It gets worse over time (is progressive). It affects each person in different ways. It makes it harder for you to:  Control how your body moves.  Move your body normally. The condition can range from mild to very bad (advanced). What are the causes? This condition results from a loss of brain cells called neurons. These brain cells make a chemical called dopamine, which is needed to control body movement. As the condition gets worse, the brain cells make less dopamine. This makes it hard to move or control your movements. The exact cause of  this condition is not known. What increases the risk?  Being male.  Being age 40 or older.  Having family members who had Parkinson's disease.  Having had an injury to the brain.  Being very sad (depressed).  Being around things that are harmful or poisonous. What are the signs or symptoms? Symptoms of this condition can vary. The main symptoms have to do with movement. These include:  A tremor or shaking while you are resting that you cannot control.  Stiffness in your neck, arms, and legs.  Slowing of movement. This may include: ? Losing expressions of the face. ? Having trouble making small movements that are needed to button your clothing or brush your teeth.  Walking in a way that is not normal. You may walk with short, shuffling steps.  Loss of balance when standing. You may sway, fall backward, or have trouble making turns. Other symptoms include:  Being very sad, worried, or confused.  Seeing or hearing things that are not real.  Losing thinking abilities (dementia).  Trouble speaking or swallowing.  Having a hard time pooping (constipation).  Needing to pee right away, peeing often, or not being able to control when you pee or poop.  Sleep problems.   How is this treated? There is no cure. The goal of treatment is to manage your symptoms. Treatment may include:  Medicines.  Therapy to help with talking or movement.  Surgery to reduce shaking and other movements that you cannot control. Follow these instructions at home: Medicines  Take over-the-counter and prescription medicines only as told by your doctor.  Avoid taking pain or sleeping medicines. Eating and drinking  Follow instructions from your doctor about what you cannot eat or drink.  Do not drink alcohol. Activity  Talk with your doctor about if it is safe for you to drive.  Do exercises as told by your doctor. Lifestyle  Put in grab bars and railings in your home. These help to  prevent falls.  Do not use any products that contain nicotine or tobacco, such as cigarettes, e-cigarettes, and chewing tobacco. If you need help quitting, ask your doctor.  Join a support group.      General instructions  Talk with your doctor about what you need help with and what your safety needs are.  Keep all follow-up visits as told by your doctor, including any therapy visits to help with talking or moving. This is important. Contact a doctor if:  Medicines do not help your symptoms.  You feel off-balance.  You fall at home.  You need more help at home.  You have trouble swallowing.  You have a very hard time pooping.  You have a lot of side effects from your medicines.  You feel very sad, worried, or confused. Get help right away if:  You were hurt in a  fall.  You see or hear things that are not real.  You cannot swallow without choking.  You have chest pain or trouble breathing.  You do not feel safe at home.  You have thoughts about hurting yourself or others. If you ever feel like you may hurt yourself or others, or have thoughts about taking your own life, get help right away. You can go to your nearest emergency department or call:  Your local emergency services (911 in the [place]  A suicide crisis helpline, such as the [place] at 228-158-4149. This is open 24 hours a day. Summary  This condition causes problems with movements.  It is a long-term condition. It gets worse over time.  There is no cure. Treatment focuses on managing your symptoms.  Talk with your doctor about what you need help with and what your safety needs are.  Keep all follow-up visits as told by your doctor. This is important. This information is not intended to replace advice given to you by your health care provider. Make sure you discuss any questions you have with your health care provider. Document Revised: 02/17/2019 Document Reviewed:  02/17/2019 Elsevier Patient Education  [place].

## 2021-04-15 ENCOUNTER — Telehealth: Payer: Self-pay | Admitting: Family Medicine

## 2021-04-15 NOTE — Telephone Encounter (Signed)
Pt's wife(on [place]) is asking for a call from Dr Jaynee Eagles to discuss pt's medications

## 2021-04-15 NOTE — Telephone Encounter (Signed)
I called the wife back.  When asking the wife could not help, she states she wanted Dr. Jaynee Eagles to call her personally.  I instructed the wife that Dr. Jaynee Eagles is not in the office and is not expected to return to work for few days. I informed the patient's wife that Dr Jaynee Eagles likes for [place] nurses to help trouble troubleshoot if we possibly can. The wife again stated that she would prefer to have Dr Jaynee Eagles personally call her. She asked when she would be in the office and I informed that I am unsure at this time when her return would be. For the third time asked if I could at least take a message of he rquestions and concerns so that if I needed to I could discuss with our work in [place] or at least have the messaged taken upon Dr Cathren Laine arrival and she declined to discuss further with me. I advised I would let Dr Jaynee Eagles know but that I am unsure when a return call would be completed. She verbalized understanding.

## 2021-04-16 NOTE — Telephone Encounter (Signed)
Please try and call her Kyle Mcguire. Tell her it is unclear when I will return because based on what work at health tells me I may not be back tomorrow. Or we can schedule a phone appointment next week. Thank you.

## 2021-04-16 NOTE — Telephone Encounter (Signed)
I called the patient's wife and kindly introduced myself as Dr. Cathren Laine nurse.  I advised that Dr. Jaynee Eagles was still out of the office but that she had checked her messages and has asked for me to please try to call and gather some information.  I let her know that when Dr. Jaynee Eagles returns to the office she will be with patients all day and there is no guarantee she would be able to call her back and wanted me to either get information over the phone or pt/wife is welcome to set up a MyChart account and send her concerns in the message.  The wife replied "I get it" and then I went on to ask her what she preferred but I noticed the call had ended. Either there was a drop in connection or she hung up.

## 2021-04-22 ENCOUNTER — Telehealth: Payer: Self-pay | Admitting: Family Medicine

## 2021-04-22 NOTE — Telephone Encounter (Addendum)
PA initated for Exelon 9.5mg  patches .Contact plan to follow up on TIER REDUCTION [id number] determination 5 days.

## 2021-04-22 NOTE — Telephone Encounter (Signed)
Adela Lank with [facility] states they are unable to take this patient.   Here is an additional message from Melody Hill with [facility] regarding his [place] referral:   "I don't know if any [place] agency will accept this payer. We don't have a contract so we cannot. Can the patient do outpatient? This payer is difficult to get anyone to accept without a good payer to pair with it.I'm sorry. That's how home health is these days. Let me know if patient can do outpatient. Otherwise, I can wait for a good payer and try to pair it"  Is it possible for patient to be seen at an outpatient facility?

## 2021-04-22 NOTE — Telephone Encounter (Signed)
Late entry:  5/5- Sent message to [place] at [facility] to see if they are able to take this patient.   5/9- Spoke with Hoyle Sauer at [facility]. They are not able to take this patient. I contacted Adela Lank with [facility] to see if they can take patient. Waiting to hear back.

## 2021-04-22 NOTE — Telephone Encounter (Signed)
Pt's wife has called to report an increase in rivastigmine (EXELON) 9.5 mg/24hr will cause diarrhea, wife has been told Dr Jaynee Eagles needs to call insurance to request this be changed to a tier 2.  If this can not be done wife wants pt to stay on current dose.  Wife also states pt will soon need a refill.  [employer] told wife that provider can call [employer] @ [phone] to get the medication discounted or even for free for pt.  Please call

## 2021-04-22 NOTE — Telephone Encounter (Signed)
Please let family know that [facility] is going to be hard to get covered. If they want we can reorder outpatient PT/OT/ST.

## 2021-04-23 NOTE — Telephone Encounter (Signed)
Spoke to wife.  She wanted to stay on the rivastigmine 4.6mg  patch for now.  Needs Tier reduction exception if can get this $300 is too much, if not will try PAP by [facility] # on other message.  I sent urgent for the 9.5mg  patch but will redo for the 4.6 mg patch.

## 2021-04-23 NOTE — Telephone Encounter (Signed)
Spoke with patient's wife about the situation. She understands that it will be difficult to find a home health agency that will take patient on. She does not wish to have him referred for outpatient PT at this time. She states that patient goes to classes weekly for Parkinson's and the exercises seem to help him and keep him busy. I let her know to reach out to [place] if she changes her mind.

## 2021-04-23 NOTE — Telephone Encounter (Signed)
Received denial for tier reduction on rivastigmine 4.6/ 24 hour patch if there is another option at a lower cost for treating same condition.  There is no other lower cost option.  [employer] clinical review.   I called Lennette Bihari with [facility] for PAP on Rivastigamine , LMVM for him to return call.

## 2021-04-24 NOTE — Telephone Encounter (Signed)
I called wife.  No response from [facility] # she gave me, she stated was the wrong #.  Gave me [phone] [facility].

## 2021-04-25 NOTE — Telephone Encounter (Signed)
Attempted to call other # and was on hold for 10 min.  To you want to change to something else?? Oral as cost is too much and Im not having too much luck with getting anyone on phone about PCP.

## 2021-04-25 NOTE — Telephone Encounter (Signed)
He can not tolerate oral options due to diarrhea. Please try to get patch covered.

## 2021-04-29 MED ORDER — RIVASTIGMINE 4.6 MG/24HR TD PT24: 4.6000 mg | MEDICATED_PATCH | Freq: Every day | TRANSDERMAL | 12 refills | Status: DC

## 2021-04-29 NOTE — Telephone Encounter (Signed)
I placed new order for the the rivastigmine 4.6mg  24 hour patch as wife wants for pt to continue.  She is afraid the increase may cause diarrhea. I have tried to contact [facility] for getting asistance but have not been able to do this.  I did do a tier reduction exception and was not approved.  I did place new order for pts patch that he was on hopefully will be able to afford as he was on previously.

## 2021-04-29 NOTE — Addendum Note (Signed)
Addended by: Brandon Melnick on: 04/29/2021 08:46 AM   Modules accepted: Orders

## 2021-04-30 DIAGNOSIS — E538 Deficiency of other specified B group vitamins: Secondary | ICD-10-CM | POA: Diagnosis not present

## 2021-04-30 NOTE — Telephone Encounter (Signed)
Pt's wife returned the call. Please call back when available.

## 2021-04-30 NOTE — Telephone Encounter (Signed)
I called and spoke to [facility] for cost of 4.6mg  patch rivastimine 90 day supply is $275/month. I called and LMVM for wife If not able to get at [facility] where had gotten previously at lower cost, option may be to change to exelon brand name and see if can get PAP from that manufacturer.  She is to call back pending her decision.  The tier exception was not approved.

## 2021-05-01 MED ORDER — RIVASTIGMINE TARTRATE 1.5 MG PO CAPS: 1.5000 mg | ORAL_CAPSULE | Freq: Two times a day (BID) | ORAL | 3 refills | Status: DC

## 2021-05-01 NOTE — Telephone Encounter (Signed)
I called and spoke to wife concerning rivastigmine patches.  No PAP that they would qualify for she stated after on phone for 3 hours.  They did mention to her about exleon capsules and she willing to try this if you are ok.  Please advise.   If so send to [facility].  (new insurance is why cost increase).  She was getting $30 / mon when on other insurance, but since change now more $$$.  I did mention that donepezil did cause diarrhea and this may be issue with other oral products.  Since tier exception denied can do appeal.

## 2021-05-01 NOTE — Telephone Encounter (Signed)
Kirby like the patch is going to be too expensive. Let's switch him to the capsule form. I am going to start low and increase slowly. I have called in 1.5mg  capsules ordered twice daily. Have him take once daily for 2-3 weeks. As long as he is tolerating medication well, I will have him increase to 1.5mg  twice daily. If doing well and he would like to increase we can but I will have him call once doing well on 1.5mg  twice daily for at least 4-6 weeks. Discontinue patch.

## 2021-05-01 NOTE — Addendum Note (Signed)
Addended byDebbora Presto L on: 05/01/2021 03:22 PM   Modules accepted: Orders

## 2021-05-01 NOTE — Telephone Encounter (Signed)
I called wife. Rivastigmine 1.5mg  capsule po daily for 2-3 wks then if tolerating increase to one capsule twice daily.  She verbalized understanding. Sent to [employer] order.  To call if questions.   She appreciated call.

## 2021-05-14 ENCOUNTER — Telehealth: Payer: Self-pay | Admitting: Family Medicine

## 2021-05-14 NOTE — Telephone Encounter (Signed)
I called wife.  She relates that patient presented with dizziness fogginess. He was at his son's home after leaving his exercise class.   She feels that he was having the symptoms due to being in an overheated truck for a little while.  He is better now resting, hydrating at home. No complaints at this time.   She was made aware of S/S stroke and if needs urgent care or ED call 911.  She verbalized understanding.

## 2021-05-14 NOTE — Telephone Encounter (Signed)
Pt's wife, Kyle Mcguire (on [place]) called, he feeling foggy, dizziness. He does not want to call EMS at this time. Would like a call from the nurse. I do not know what to do.

## 2021-05-20 ENCOUNTER — Other Ambulatory Visit: Payer: Self-pay | Admitting: Neurology

## 2021-05-22 DIAGNOSIS — W57XXXA Bitten or stung by nonvenomous insect and other nonvenomous arthropods, initial encounter: Secondary | ICD-10-CM | POA: Diagnosis not present

## 2021-05-22 DIAGNOSIS — D229 Melanocytic nevi, unspecified: Secondary | ICD-10-CM | POA: Diagnosis not present

## 2021-05-22 DIAGNOSIS — S1096XA Insect bite of unspecified part of neck, initial encounter: Secondary | ICD-10-CM | POA: Diagnosis not present

## 2021-05-29 DIAGNOSIS — E538 Deficiency of other specified B group vitamins: Secondary | ICD-10-CM | POA: Diagnosis not present

## 2021-06-20 DIAGNOSIS — D225 Melanocytic nevi of trunk: Secondary | ICD-10-CM | POA: Diagnosis not present

## 2021-06-20 DIAGNOSIS — X32XXXA Exposure to sunlight, initial encounter: Secondary | ICD-10-CM | POA: Diagnosis not present

## 2021-06-20 DIAGNOSIS — L821 Other seborrheic keratosis: Secondary | ICD-10-CM | POA: Diagnosis not present

## 2021-06-20 DIAGNOSIS — L570 Actinic keratosis: Secondary | ICD-10-CM | POA: Diagnosis not present

## 2021-06-26 DIAGNOSIS — E538 Deficiency of other specified B group vitamins: Secondary | ICD-10-CM | POA: Diagnosis not present

## 2021-07-15 DIAGNOSIS — Z6832 Body mass index (BMI) 32.0-32.9, adult: Secondary | ICD-10-CM | POA: Diagnosis not present

## 2021-07-15 DIAGNOSIS — M7989 Other specified soft tissue disorders: Secondary | ICD-10-CM | POA: Diagnosis not present

## 2021-07-15 DIAGNOSIS — B351 Tinea unguium: Secondary | ICD-10-CM | POA: Diagnosis not present

## 2021-07-17 ENCOUNTER — Telehealth: Payer: Self-pay | Admitting: Family Medicine

## 2021-07-17 DIAGNOSIS — Z6832 Body mass index (BMI) 32.0-32.9, adult: Secondary | ICD-10-CM | POA: Diagnosis not present

## 2021-07-17 DIAGNOSIS — H6120 Impacted cerumen, unspecified ear: Secondary | ICD-10-CM | POA: Diagnosis not present

## 2021-07-17 DIAGNOSIS — R42 Dizziness and giddiness: Secondary | ICD-10-CM | POA: Diagnosis not present

## 2021-07-17 DIAGNOSIS — E538 Deficiency of other specified B group vitamins: Secondary | ICD-10-CM | POA: Diagnosis not present

## 2021-07-17 NOTE — Telephone Encounter (Signed)
Pt's wife called, is with her husband at the doctors office now, doctor informed her to call and make an appt with [place] due to his dizziness becoming severely worse. Pt's wife requesting a call back.

## 2021-07-17 NOTE — Telephone Encounter (Signed)
Called the patient's wife and was able to get the patient to work in on Monday at 3 pm with Amy. She was appreciative for the work in

## 2021-07-18 ENCOUNTER — Emergency Department (HOSPITAL_COMMUNITY): Payer: Medicare HMO

## 2021-07-18 ENCOUNTER — Encounter (HOSPITAL_COMMUNITY): Payer: Self-pay

## 2021-07-18 ENCOUNTER — Other Ambulatory Visit: Payer: Self-pay

## 2021-07-18 ENCOUNTER — Emergency Department (HOSPITAL_COMMUNITY)
Admission: EM | Admit: 2021-07-18 | Discharge: 2021-07-19 | Disposition: A | Discharge status: Home / Self Care | Payer: Medicare HMO | Attending: Emergency Medicine | Admitting: Emergency Medicine

## 2021-07-18 DIAGNOSIS — Z20822 Contact with and (suspected) exposure to covid-19: Secondary | ICD-10-CM | POA: Insufficient documentation

## 2021-07-18 DIAGNOSIS — R531 Weakness: Secondary | ICD-10-CM | POA: Diagnosis not present

## 2021-07-18 DIAGNOSIS — Z7982 Long term (current) use of aspirin: Secondary | ICD-10-CM | POA: Diagnosis not present

## 2021-07-18 DIAGNOSIS — R42 Dizziness and giddiness: Secondary | ICD-10-CM | POA: Insufficient documentation

## 2021-07-18 DIAGNOSIS — Z79899 Other long term (current) drug therapy: Secondary | ICD-10-CM | POA: Insufficient documentation

## 2021-07-18 DIAGNOSIS — G20 Parkinson's disease: Secondary | ICD-10-CM | POA: Diagnosis not present

## 2021-07-18 DIAGNOSIS — F0281 Dementia in other diseases classified elsewhere with behavioral disturbance: Secondary | ICD-10-CM | POA: Diagnosis not present

## 2021-07-18 DIAGNOSIS — R000 Tachycardia, unspecified: Secondary | ICD-10-CM | POA: Diagnosis not present

## 2021-07-18 DIAGNOSIS — R404 Transient alteration of awareness: Secondary | ICD-10-CM | POA: Diagnosis not present

## 2021-07-18 LAB — COMPREHENSIVE METABOLIC PANEL
ALT: 18 U/L (ref 0–44)
AST: 18 U/L (ref 15–41)
Albumin: 4.1 g/dL (ref 3.5–5.0)
Alkaline Phosphatase: 66 U/L (ref 38–126)
Anion gap: 8 (ref 5–15)
BUN: 18 mg/dL (ref 8–23)
CO2: 28 mmol/L (ref 22–32)
Calcium: 9.3 mg/dL (ref 8.9–10.3)
Chloride: 103 mmol/L (ref 98–111)
Creatinine, Ser: 0.78 mg/dL (ref 0.61–1.24)
GFR, Estimated: >60 mL/min (ref >60)
Glucose, Bld: 98 mg/dL (ref 70–99)
Potassium: 3.7 mmol/L (ref 3.5–5.1)
Sodium: 139 mmol/L (ref 135–145)
Total Bilirubin: 0.8 mg/dL (ref 0.3–1.2)
Total Protein: 6.5 g/dL (ref 6.5–8.1)

## 2021-07-18 LAB — CBC WITH DIFFERENTIAL/PLATELET
Abs Immature Granulocytes: 0.06 10*3/uL (ref 0.00–0.07)
Basophils Absolute: 0.1 10*3/uL (ref 0.0–0.1)
Basophils Relative: 1 %
Eosinophils Absolute: 0.1 10*3/uL (ref 0.0–0.5)
Eosinophils Relative: 1 %
HCT: 46.7 % (ref 39.0–52.0)
Hemoglobin: 15.8 g/dL (ref 13.0–17.0)
Immature Granulocytes: 1 %
Lymphocytes Relative: 9 %
Lymphs Abs: 0.8 10*3/uL (ref 0.7–4.0)
MCH: 31.9 pg (ref 26.0–34.0)
MCHC: 33.8 g/dL (ref 30.0–36.0)
MCV: 94.2 fL (ref 80.0–100.0)
Monocytes Absolute: 0.9 10*3/uL (ref 0.1–1.0)
Monocytes Relative: 10 %
Neutro Abs: 6.9 10*3/uL (ref 1.7–7.7)
Neutrophils Relative %: 78 %
Platelets: 235 10*3/uL (ref 150–400)
RBC: 4.96 MIL/uL (ref 4.22–5.81)
RDW: 13.1 % (ref 11.5–15.5)
WBC: 8.8 10*3/uL (ref 4.0–10.5)
nRBC: 0 % (ref 0.0–0.2)

## 2021-07-18 LAB — URINALYSIS, ROUTINE W REFLEX MICROSCOPIC
Bacteria, UA: NONE SEEN
Bilirubin Urine: NEGATIVE
Glucose, UA: NEGATIVE mg/dL
Ketones, ur: NEGATIVE mg/dL
Leukocytes,Ua: NEGATIVE
Nitrite: NEGATIVE
Protein, ur: NEGATIVE mg/dL
Specific Gravity, Urine: 1.001 — ABNORMAL LOW (ref 1.005–1.030)
pH: 8 (ref 5.0–8.0)

## 2021-07-18 LAB — RESP PANEL BY RT-PCR (FLU A&B, COVID) ARPGX2
Influenza A by PCR: NEGATIVE
Influenza B by PCR: NEGATIVE
SARS Coronavirus 2 by RT PCR: NEGATIVE

## 2021-07-18 LAB — TROPONIN I (HIGH SENSITIVITY)
Troponin I (High Sensitivity): 7 ng/L (ref <18)
Troponin I (High Sensitivity): 8 ng/L (ref <18)

## 2021-07-18 LAB — TSH: TSH: 2.187 u[IU]/mL (ref 0.350–4.500)

## 2021-07-18 MED ORDER — LACTATED RINGERS IV BOLUS
1000.0000 mL | Freq: Once | INTRAVENOUS | Status: AC
  Administered 2021-07-18: 1000 mL via INTRAVENOUS

## 2021-07-18 NOTE — ED Provider Notes (Signed)
Care assumed from Dr. Maryan Rued, patient with recent dizziness, pending MRI of brain.  MRI shows no evidence of stroke.  He is discharged home.  Results for orders placed or performed during the hospital encounter of 07/18/21  Resp Panel by RT-PCR (Flu A&B, Covid) Nasopharyngeal Swab   Specimen: Nasopharyngeal Swab; Nasopharyngeal(NP) swabs in vial transport medium  Result Value Ref Range   SARS Coronavirus 2 by RT PCR NEGATIVE NEGATIVE   Influenza A by PCR NEGATIVE NEGATIVE   Influenza B by PCR NEGATIVE NEGATIVE  Urinalysis, Routine w reflex microscopic Urine, Clean Catch  Result Value Ref Range   Color, Urine COLORLESS (A) YELLOW   APPearance CLEAR CLEAR   Specific Gravity, Urine 1.001 (L) 1.005 - 1.030   pH 8.0 5.0 - 8.0   Glucose, UA NEGATIVE NEGATIVE mg/dL   Hgb urine dipstick MODERATE (A) NEGATIVE   Bilirubin Urine NEGATIVE NEGATIVE   Ketones, ur NEGATIVE NEGATIVE mg/dL   Protein, ur NEGATIVE NEGATIVE mg/dL   Nitrite NEGATIVE NEGATIVE   Leukocytes,Ua NEGATIVE NEGATIVE   Bacteria, UA NONE SEEN NONE SEEN  CBC with Differential/Platelet  Result Value Ref Range   WBC 8.8 4.0 - 10.5 K/uL   RBC 4.96 4.22 - 5.81 MIL/uL   Hemoglobin 15.8 13.0 - 17.0 g/dL   HCT 46.7 39.0 - 52.0 %   MCV 94.2 80.0 - 100.0 fL   MCH 31.9 26.0 - 34.0 pg   MCHC 33.8 30.0 - 36.0 g/dL   RDW 13.1 11.5 - 15.5 %   Platelets 235 150 - 400 K/uL   nRBC 0.0 0.0 - 0.2 %   Neutrophils Relative % 78 %   Neutro Abs 6.9 1.7 - 7.7 K/uL   Lymphocytes Relative 9 %   Lymphs Abs 0.8 0.7 - 4.0 K/uL   Monocytes Relative 10 %   Monocytes Absolute 0.9 0.1 - 1.0 K/uL   Eosinophils Relative 1 %   Eosinophils Absolute 0.1 0.0 - 0.5 K/uL   Basophils Relative 1 %   Basophils Absolute 0.1 0.0 - 0.1 K/uL   Immature Granulocytes 1 %   Abs Immature Granulocytes 0.06 0.00 - 0.07 K/uL  Comprehensive metabolic panel  Result Value Ref Range   Sodium 139 135 - 145 mmol/L   Potassium 3.7 3.5 - 5.1 mmol/L   Chloride 103 98 - 111  mmol/L   CO2 28 22 - 32 mmol/L   Glucose, Bld 98 70 - 99 mg/dL   BUN 18 8 - 23 mg/dL   Creatinine, Ser 0.78 0.61 - 1.24 mg/dL   Calcium 9.3 8.9 - 10.3 mg/dL   Total Protein 6.5 6.5 - 8.1 g/dL   Albumin 4.1 3.5 - 5.0 g/dL   AST 18 15 - 41 U/L   ALT 18 0 - 44 U/L   Alkaline Phosphatase 66 38 - 126 U/L   Total Bilirubin 0.8 0.3 - 1.2 mg/dL   GFR, Estimated >60 >60 mL/min   Anion gap 8 5 - 15  TSH  Result Value Ref Range   TSH 2.187 0.350 - 4.500 uIU/mL  Troponin I (High Sensitivity)  Result Value Ref Range   Troponin I (High Sensitivity) 7 <18 ng/L  Troponin I (High Sensitivity)  Result Value Ref Range   Troponin I (High Sensitivity) 8 <18 ng/L   MR BRAIN WO CONTRAST  Result Date: 07/19/2021 CLINICAL DATA:  Dizziness EXAM: MRI HEAD WITHOUT CONTRAST TECHNIQUE: Multiplanar, multiecho pulse sequences of the brain and surrounding structures were obtained without intravenous contrast. COMPARISON:  None.  FINDINGS: Brain: No acute infarct, mass effect or extra-axial collection. No acute or chronic hemorrhage. There is multifocal hyperintense T2-weighted signal within the white matter. Generalized volume loss without a clear lobar predilection. The midline structures are normal. Vascular: Major flow voids are preserved. Skull and upper cervical spine: Normal calvarium and skull base. Visualized upper cervical spine and soft tissues are normal. Sinuses/Orbits:No paranasal sinus fluid levels or advanced mucosal thickening. No mastoid or middle ear effusion. Normal orbits. IMPRESSION: 1. No acute intracranial abnormality. 2. Findings of chronic small vessel ischemia and generalized volume loss. Electronically Signed   By: Ulyses Jarred M.D.   On: 07/19/2021 00:41   DG Chest Port 1 View  Result Date: 07/18/2021 CLINICAL DATA:  Dizziness EXAM: PORTABLE CHEST 1 VIEW COMPARISON:  07/23/2020 FINDINGS: The heart size and mediastinal contours are within normal limits. Aortic atherosclerosis. Both lungs are  clear. The visualized skeletal structures are unremarkable. Old right-sided rib fracture. IMPRESSION: No active disease. Electronically Signed   By: Donavan Foil M.D.   On: 07/18/2021 19:21    Images viewed by me.    Delora Fuel, MD [id number] [phone]

## 2021-07-18 NOTE — ED Provider Notes (Signed)
[place] DEPT Provider Note   CSN: [id number] Arrival date & time: 07/18/21  1806     History Chief Complaint  Patient presents with   Weakness    Kyle Mcguire is a 80 y.o. male.  Patient is a 80 year old male with a history of dementia and Parkinson's disease who is presenting today with complaints of dizziness and weakness.  Patient and the wife give history.  For the last 1 week to maybe 10 days he has had gradually worsening dizziness, unsteady gait and generalized weakness.  Patient reports he feels dizzy like he is on a boat when he tries to sit up or stand to walk.  He feels off balance like he is going to fall when he walks even with his walker.  Wife reports that this is accurate and he is complained of dizziness in the past but is always seem to pass on its own.  She denies him complaining about any type of pain he has not had any fever, cough, nausea, vomiting, diarrhea.  He has not complained of chest pain or shortness of breath.  He has eaten less than usual but is still eating.  He has not had any recent medication changes.  She does report that today she had to help him get up from a sitting position which is unusual and he seems more weak in general.  He did see his doctor yesterday and had blood work drawn and they called to get an appointment with the neurologist which was set for Monday but they called their doctor again today and because he was not doing any better they recommended they come here for further evaluation.  Patient states that it is difficult sometimes to urinate but denies burning.  The history is provided by the patient, medical records and the spouse.  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 Authorizing Provider  aspirin 81 MG chewable tablet Chew 81 mg by mouth daily.    [provider]  carbidopa-levodopa (SINEMET IR) 25-100 MG tablet TAKE 1 TABLET FOUR TIMES DAILY 05/21/21   [name], MD  FIBER PO Take 1 tablet by mouth at bedtime.    [provider]  rivastigmine (EXELON) 1.5 MG capsule Take 1 capsule (1.5 mg total) by mouth 2 (two) times daily. 05/01/21   Debbora Presto, NP    Allergies    Patient has no known allergies.  Review of Systems   Review of  Systems  Neurological:  Positive for weakness.  All other systems reviewed and are negative.  Physical Exam Updated Vital Signs BP (!) 158/114 (BP Location: Left Arm)   Pulse (!) 54   Temp 97.8 F (36.6 C) (Oral)   Resp 18   Ht '5\' 5"'$  (1.651 m)   Wt 85 kg   SpO2 97%   BMI 31.18 kg/m   Physical Exam Vitals and nursing note reviewed.  Constitutional:      General: He is not in acute distress.    Appearance: Normal appearance. He is well-developed and normal weight.  HENT:     Head: Normocephalic and atraumatic.     Mouth/Throat:     Mouth: Mucous membranes are moist.  Eyes:     Conjunctiva/sclera: Conjunctivae normal.     Pupils: Pupils are equal, round, and reactive to light.  Cardiovascular:     Rate and Rhythm: Normal rate and regular rhythm.     Heart sounds: No  murmur heard. Pulmonary:     Effort: Pulmonary effort is normal. No respiratory distress.     Breath sounds: Normal breath sounds. No wheezing or rales.  Abdominal:     General: There is no distension.     Palpations: Abdomen is soft.     Tenderness: There is no abdominal tenderness. There is no guarding or rebound.  Musculoskeletal:        General: No tenderness. Normal range of motion.     Cervical back: Normal range of motion and neck supple.  Skin:    General: Skin is warm and dry.     Findings: No erythema or rash.  Neurological:     Mental Status: He is alert.     Comments: Resting tremor in bilateral upper extremities no significant on the right.  Normal finger-to-nose testing.  Normal heel-to-shin.  5 out of 5 strength in the upper and lower extremities.  No sensation deficit.  No notable nystagmus but dizziness reproduced with sitting the patient up.  Oriented to person and place  Psychiatric:        Mood and Affect: Mood normal.        Behavior: Behavior normal.    ED Results / Procedures / Treatments   Labs (all labs ordered are listed, but only abnormal results are displayed) Labs Reviewed  URINALYSIS, ROUTINE W REFLEX MICROSCOPIC - Abnormal; Notable for the following components:      Result Value   Color, Urine COLORLESS (*)    Specific Gravity, Urine 1.001 (*)    Hgb urine dipstick MODERATE (*)    All other components within normal limits  RESP PANEL BY RT-PCR (FLU A&B, COVID) ARPGX2  CBC WITH DIFFERENTIAL/PLATELET  COMPREHENSIVE METABOLIC PANEL  TSH  TROPONIN I (HIGH SENSITIVITY)  TROPONIN I (HIGH SENSITIVITY)    EKG EKG Interpretation  Date/Time:  Thursday July 18 2021 18:55:45 EDT Ventricular Rate:  54 PR Interval:  178 QRS Duration: 80 QT Interval:  463 QTC Calculation: 439 R Axis:   -9 Text Interpretation: Sinus rhythm Posterior infarct, old Baseline wander in lead(s) V1 No previous tracing Confirmed by Blanchie Dessert [phone]) on 07/18/2021  7:44:56 PM  Radiology DG Chest Port 1 View  Result Date: 07/18/2021 CLINICAL DATA:  Dizziness EXAM: PORTABLE CHEST 1 VIEW COMPARISON:  07/23/2020 FINDINGS: The heart size and mediastinal contours are within normal limits. Aortic atherosclerosis. Both lungs are clear. The visualized skeletal structures are unremarkable. Old right-sided rib fracture. IMPRESSION: No active disease. Electronically Signed   By:  Donavan Foil M.D.   On: 07/18/2021 19:21    Procedures Procedures   Medications Ordered in ED Medications  lactated ringers bolus 1,000 mL (has no administration in time range)    ED Course  I have reviewed the triage vital signs and the nursing notes.  Pertinent labs & imaging results that were available during my care of the patient were reviewed by me and considered in my medical decision making (see chart for details).    MDM Rules/Calculators/A&P                           80 year old male presenting today with complaints of nonspecific dizziness and weakness.  This has been present for approximately a week to 10 days.  Wife denies any infectious symptoms, mild decreased intake.  Concern for possible atypical cardiac etiology, peripheral vertigo, stroke, dehydration, AKI, electrolyte abnormality, hypothyroidism.  He has not had any recent medication changes.  Patient given a bolus of fluids, labs and imaging pending.  11:17 PM All labs wnl.  Still waiting on MRI.  EKG and CXR wnl.  Orthostatics neg today.  Pt checked out to Dr. Roxanne Mins.  MDM   Amount and/or Complexity of Data Reviewed Clinical lab tests: ordered and reviewed Tests in the radiology section of CPT: ordered and reviewed Tests in the medicine section of CPT: ordered and reviewed Decide to obtain previous medical records or to obtain history from someone other than the patient: yes Review and summarize past medical records: yes Independent visualization of images, tracings, or specimens: yes     Final  Clinical Impression(s) / ED Diagnoses Final diagnoses:  None    Rx / DC Orders ED Discharge Orders     None        Blanchie Dessert, MD 07/18/21 2319

## 2021-07-18 NOTE — ED Notes (Signed)
MRI at bedside to transport pt for scan.

## 2021-07-18 NOTE — ED Triage Notes (Signed)
Pt comes from home- hx of dementia, wife called today for increased weakness. Pt seen recently by PCP and had blood work done, awaiting results. Pt c/o dizziness. Pt A&O to person, place and event

## 2021-07-19 DIAGNOSIS — R42 Dizziness and giddiness: Secondary | ICD-10-CM | POA: Diagnosis not present

## 2021-07-19 NOTE — ED Notes (Signed)
Pt wife, [name] updated about pt status

## 2021-07-19 NOTE — Discharge Instructions (Addendum)
Here evaluation in the emergency department, including MRI scan, did not show a clear cause for your symptoms.  Please follow-up with your primary care provider and your neurologist.  Return if there are any new or concerning symptoms.

## 2021-07-19 NOTE — ED Notes (Signed)
Zael Koeck would like an update on her husband call back number is [phone]

## 2021-07-22 ENCOUNTER — Other Ambulatory Visit: Payer: Self-pay | Admitting: Neurology

## 2021-07-22 ENCOUNTER — Encounter: Payer: Self-pay | Admitting: Family Medicine

## 2021-07-22 ENCOUNTER — Telehealth: Payer: Self-pay | Admitting: Family Medicine

## 2021-07-22 ENCOUNTER — Emergency Department (HOSPITAL_COMMUNITY)
Admission: EM | Admit: 2021-07-22 | Discharge: 2021-07-22 | Disposition: A | Discharge status: Home / Self Care | Payer: Medicare HMO | Attending: Emergency Medicine | Admitting: Emergency Medicine

## 2021-07-22 ENCOUNTER — Other Ambulatory Visit: Payer: Self-pay

## 2021-07-22 ENCOUNTER — Ambulatory Visit: Payer: Medicare HMO | Admitting: Family Medicine

## 2021-07-22 VITALS — BP 107/68 | HR 54 | Ht 65.0 in

## 2021-07-22 DIAGNOSIS — Z7982 Long term (current) use of aspirin: Secondary | ICD-10-CM | POA: Diagnosis not present

## 2021-07-22 DIAGNOSIS — Z743 Need for continuous supervision: Secondary | ICD-10-CM | POA: Diagnosis not present

## 2021-07-22 DIAGNOSIS — R531 Weakness: Secondary | ICD-10-CM

## 2021-07-22 DIAGNOSIS — Z5321 Procedure and treatment not carried out due to patient leaving prior to being seen by health care provider: Secondary | ICD-10-CM | POA: Insufficient documentation

## 2021-07-22 DIAGNOSIS — G20 Parkinson's disease: Secondary | ICD-10-CM

## 2021-07-22 DIAGNOSIS — B351 Tinea unguium: Secondary | ICD-10-CM | POA: Diagnosis not present

## 2021-07-22 DIAGNOSIS — R600 Localized edema: Secondary | ICD-10-CM | POA: Diagnosis not present

## 2021-07-22 DIAGNOSIS — F0390 Unspecified dementia without behavioral disturbance: Secondary | ICD-10-CM | POA: Diagnosis not present

## 2021-07-22 DIAGNOSIS — R2689 Other abnormalities of gait and mobility: Secondary | ICD-10-CM | POA: Diagnosis not present

## 2021-07-22 DIAGNOSIS — R52 Pain, unspecified: Secondary | ICD-10-CM | POA: Diagnosis not present

## 2021-07-22 DIAGNOSIS — Z66 Do not resuscitate: Secondary | ICD-10-CM | POA: Diagnosis not present

## 2021-07-22 DIAGNOSIS — Z79899 Other long term (current) drug therapy: Secondary | ICD-10-CM | POA: Diagnosis not present

## 2021-07-22 DIAGNOSIS — R42 Dizziness and giddiness: Secondary | ICD-10-CM

## 2021-07-22 DIAGNOSIS — Z20822 Contact with and (suspected) exposure to covid-19: Secondary | ICD-10-CM | POA: Diagnosis not present

## 2021-07-22 DIAGNOSIS — R41841 Cognitive communication deficit: Secondary | ICD-10-CM | POA: Diagnosis not present

## 2021-07-22 DIAGNOSIS — R001 Bradycardia, unspecified: Secondary | ICD-10-CM | POA: Diagnosis not present

## 2021-07-22 DIAGNOSIS — F0280 Dementia in other diseases classified elsewhere without behavioral disturbance: Secondary | ICD-10-CM

## 2021-07-22 DIAGNOSIS — M6281 Muscle weakness (generalized): Secondary | ICD-10-CM | POA: Diagnosis not present

## 2021-07-22 DIAGNOSIS — R296 Repeated falls: Secondary | ICD-10-CM | POA: Diagnosis not present

## 2021-07-22 DIAGNOSIS — I959 Hypotension, unspecified: Secondary | ICD-10-CM | POA: Diagnosis not present

## 2021-07-22 DIAGNOSIS — R279 Unspecified lack of coordination: Secondary | ICD-10-CM | POA: Diagnosis not present

## 2021-07-22 DIAGNOSIS — Z7409 Other reduced mobility: Secondary | ICD-10-CM | POA: Diagnosis not present

## 2021-07-22 DIAGNOSIS — E860 Dehydration: Secondary | ICD-10-CM | POA: Diagnosis not present

## 2021-07-22 NOTE — Telephone Encounter (Signed)
Pt's wife Kyle Mcguire (on [place]) called, 8/4 called EMS due to dizziness, vision difficulty; was discharge due to all test coming back normal. Called EMS 8/6; they said his vital signs were normal and did not take him to the hospital. Would like a call from the nurse. He has appt today at 3pm, will need a wheelchair when we get there.   Informed [name] to call when arrive and someone can bring you a wheelchair. Nurse cannot transfer from car to wheelchair.

## 2021-07-22 NOTE — ED Notes (Addendum)
Patient's son, Altamese Dilling, would like to speak with EDP regarding care. [phone]

## 2021-07-22 NOTE — Telephone Encounter (Signed)
Called and spoke w/ wife again. Verified EMS came on 07/20/21, and he could not walk then, having more weakness. EMS told wife he would just get d/c'd from ED again if brought in again since work up negative. I recommended she bring him back to ED d/t rapid change. They should call EMS since she cannot get him in car alone. She is hesitant to do this since she was told nothing would be done if brought back to ED. I placed on hold and AL,NP took over call. She reported the following to Amy:  When d/c'd on 07/18/21 from ED, he was able to walk out w/ 2 nurses assisting him out to the car. Wife was able to assist him when they got home into house. Reports he is 50% worse from cognitive standpoint. Pt standing up while on the phone w/ AL,NP. However, feet will not move. Unable to get him to the bathroom. Able to talk. No issues breathing. No covid sx. Dr. Felecia Shelling work in [place] this afternoon, Dr. Jaynee Eagles out of office. AL,NP will discuss w/ Dr. Felecia Shelling on how to proceed.

## 2021-07-22 NOTE — Progress Notes (Addendum)
Chief Complaint  Patient presents with   Dizziness    Room 12. Pt here with his wife. Patient's wife states that he is very weak, his legs "don't want to work." This has been going on for the past week since being d/c from the hospital.     HISTORY OF PRESENT ILLNESS: 07/22/21 ALL:  Kyle Mcguire presents, today, for an acute care visit following ER evaluation 07/18/2021 for weakness. Kyle Mcguire is seen by [place] for PD with associated dementia. He has continued carb/levo 1 tablet QID. He was prescribed rivastigmine 1.'5mg'$  tablet BID (switched from Exelon patch in 03/2021 due to expense). Wife reports she was unable to afford oral tablet and restarted him on donepezil (used old bottle, we were unaware). Up until two weeks ago, he was going to kick boxing with [employer] twice a week.   He has been experiencing waxing and waning cognition and dizziness for the past 6-12 months. His wife states that he was seen by PCP on 07/15/2021 for worsening swelling of lower extremities. PCP started him on Lasix '20mg'$  daily and updated blood work. They advised she call us for work in appt due to dizziness. On 07/18/2021, he was no better prompting her to call EMS. ER evaluation with chest xray, MRI and labs was unremarkable. He was discharged home after IV fluids. He was assisted to car by 2 nurses and his wife, per her report. She was able to get him home but reported continued worsening 8/5 and 8/6. She called EMS for evaluation 8/6 and was advised that vital signs were normal and ER evaluation not recommended.   Today, she called the office to report that she could not get him out of the bed. At 5:30am she went to answer his call to use the restroom and he had a very difficult time getting out of the bed. It took her 10 minutes to get him up and to the restroom. He is much more sleepy than normal. He denies pain. Mrs Kyle Mcguire reports that he complains of being cold. He has not had a temperature. She performed home Covid test at some  point over the past week and it was negative. She has not repeated test. No known exposure. He is vaccinated. He seems to be eating fairly normally but she is having to spoon feed him. He can hold a sandwich but unable to operate utensils. He is not drinking much fluids. He usually has about 4 ounces with medications 4 times daily and at best has about 8 ounces with dinner. He is not as sharp from cognitive standpoint. He is more confused than normal. No specific falls but she reports that he has stumbled and fell against furniture many times over the past week. She has to reposition him in a chair.   He continues carb/levo 8am, 12pm, 4pm, 8pm. There is no correlation with timing of medication and symptoms. She reports that weakness if generalized and present all day. Weakness is progressively and significantly worse over the past week. She does not feel she can care for him at home. She called the fire department to get him to his appt, today.   04/11/2021 ALL:  Kyle Mcguire is a 80 y.o. male here today for follow up for PD with dementia. He continues Sinemet QID and Exelon 4.'6mg'$  daily. He feels that balance is worsening. He has a cane but does not use. He worked with PT years ago but did not feel it was helpful. No  pain. He continues kickboxing twice a week. Memory seems to be declining. He has a harder time finding words. He is able to perform ADLs. He is eating normally. He is sleeping more. He sets reminder on his phone to help with medication administration. Insurance no longer covering Exelon patch. He has tried and failed Aricept. Aricept caused significant diarrhea.   HISTORY (copied from Dr Cathren Laine previous note)  05/16/2020: No complaints of dizziness. His wife provided much information. He is going for b12 shots and feeling better. He is getting injections. No falls, new symptoms, stable. He is even behaving himself. He is walking every day now, little walks. He is doing well. Stable with  sinemet. Taking the Exelon Patch. He was having back problems, he has degenerative disk disease, he is in physical therapy, he has been to [facility]. He will restart the [facility] after that. He says he is feeling well, he is walking, he is going to PT, he feels stable, no falls. He mows his son's lawn weekly.    REVIEW OF SYSTEMS: Out of a complete 14 system review of symptoms, the patient complains only of the following symptoms, gait instability, memory loss, acute weakness, cold sensation and all other reviewed systems are negative.  ALLERGIES: No Known Allergies   HOME MEDICATIONS: Outpatient Medications Prior to Visit  Medication Sig Dispense Refill   aspirin 81 MG chewable tablet Chew 81 mg by mouth daily.     carbidopa-levodopa (SINEMET IR) 25-100 MG tablet TAKE 1 TABLET FOUR TIMES DAILY 360 tablet 0   donepezil (ARICEPT) 10 MG tablet Take 5 mg by mouth at bedtime.     furosemide (LASIX) 20 MG tablet Take 20 mg by mouth.     FIBER PO Take 1 tablet by mouth at bedtime. (Patient not taking: Reported on 07/22/2021)     rivastigmine (EXELON) 1.5 MG capsule Take 1 capsule (1.5 mg total) by mouth 2 (two) times daily. (Patient not taking: Reported on 07/22/2021) 180 capsule 3   No facility-administered medications prior to visit.     PAST MEDICAL HISTORY: Past Medical History:  Diagnosis Date   Dementia ([place])    Hearing loss    Parkinson's disease ([place])      PAST SURGICAL HISTORY: Past Surgical History:  Procedure Laterality Date   no surgical history per patient     None       FAMILY HISTORY: Family History  Problem Relation Age of Onset   Parkinsonism Father      SOCIAL HISTORY: Social History   Socioeconomic History   Marital status: Married    Spouse name: Kyle Mcguire    Number of children: 2   Years of education: 12   Highest education level: Not on file  Occupational History   Occupation: Retired   Tobacco Use   Smoking status: Never   Smokeless tobacco:  Never  Vaping Use   Vaping Use: Never used  Substance and Sexual Activity   Alcohol use: No    Alcohol/week: 0.0 standard drinks   Drug use: No   Sexual activity: Not on file  Other Topics Concern   Not on file  Social History Narrative   Patient lives at home with wife Kyle Mcguire   Patient has 2 children    Patient has a high school education    Patient is right handed    Patient is retired    Caffeine use: 1 cup coffee (not daily)   Social Determinants of Engineer, drilling  Resource Strain: Not on file  Food Insecurity: Not on file  Transportation Needs: Not on file  Physical Activity: Not on file  Stress: Not on file  Social Connections: Not on file  Intimate Partner Violence: Not on file     PHYSICAL EXAM  Vitals:   07/22/21 1509  BP: 107/68  Pulse: (!) 54  Height: '5\' 5"'$  (1.651 m)    Body mass index is 31.18 kg/m.   Generalized: Well developed, in no acute distress  Cardiology: normal rate and rhythm, no murmur auscultated  Respiratory: clear to auscultation bilaterally    Neurological examination  Mentation: Alert not oriented to time, he is oriented to place and some history taking. Follows all commands speech and language fluent Cranial nerve II-XII: Pupils were equal round reactive to light. Extraocular movements were full, visual field were full on confrontational test. Facial sensation and strength were normal. Head turning and shoulder shrug  were normal and symmetric. Motor: The motor testing reveals 5 over 5 strength of bilateral upper extremities, 4-4+ of bilateral lower extremities, patient refuses to lift leg to my command but can lift both lower extremities off foot rest of wheel chair.  Sensory: Sensory testing is intact to soft touch on all 4 extremities. No evidence of extinction is noted.  Coordination: Cerebellar testing reveals slow finger to nose bilaterally, unable to perform heel to shin.  Gait and station: Unable to assess, patient reports  being too weak to stand, he was able to stand and urinate with assistance of his wife while I was out of the room.  Reflexes: Deep tendon reflexes are symmetric and normal bilaterally.    DIAGNOSTIC DATA (LABS, IMAGING, TESTING) - I reviewed patient records, labs, notes, testing and imaging myself where available.  Lab Results  Component Value Date   WBC 8.8 07/18/2021   HGB 15.8 07/18/2021   HCT 46.7 07/18/2021   MCV 94.2 07/18/2021   PLT 235 07/18/2021      Component Value Date/Time   NA 139 07/18/2021 1903   NA 141 02/18/2016 1303   K 3.7 07/18/2021 1903   CL 103 07/18/2021 1903   CO2 28 07/18/2021 1903   GLUCOSE 98 07/18/2021 1903   BUN 18 07/18/2021 1903   BUN 15 02/18/2016 1303   CREATININE 0.78 07/18/2021 1903   CALCIUM 9.3 07/18/2021 1903   PROT 6.5 07/18/2021 1903   ALBUMIN 4.1 07/18/2021 1903   AST 18 07/18/2021 1903   ALT 18 07/18/2021 1903   ALKPHOS 66 07/18/2021 1903   BILITOT 0.8 07/18/2021 1903   GFRNONAA >60 07/18/2021 1903   GFRAA >60 04/13/2020 1203   No results found for: CHOL, HDL, LDLCALC, LDLDIRECT, TRIG, CHOLHDL No results found for: HGBA1C No results found for: VITAMINB12 Lab Results  Component Value Date   TSH 2.187 07/18/2021    MMSE - Mini Mental State Exam 04/11/2021 09/13/2018 08/25/2017  Orientation to time '1 3 3  '$ Orientation to Place '2 4 5  '$ Registration '3 3 3  '$ Attention/ Calculation '3 5 3  '$ Recall 0 1 1  Language- name 2 objects '2 2 2  '$ Language- repeat 0 0 0  Language- follow 3 step command '3 3 3  '$ Language- read & follow direction '1 1 1  '$ Write a sentence 1 0 1  Copy design 0 0 0  Total score '16 22 22     '$ No flowsheet data found.   ASSESSMENT AND PLAN  80 y.o. year old male  has a past medical  history of Dementia ([place]), Hearing loss, and Parkinson's disease ([place]). 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 He was started on Lasix for lower extremity swelling on 8/1. Eval in ER 8/4 was unremarkable. MRI showed no acute findings, chest xray clear and labs unremarkable. Since returning home, Mrs Steitz reports continued worsening. She reports that he is unable to ambulate through the home. She states that he has had numerous near falls and she does not feel she can care for him at home. Kyle Mcguire denies pain but states he feels terribly and wishes to be evaluated urgently. He required public safety assistance to get to my office, today. Patient was doing quite well two weeks ago and participating in kick boxing twice weekly. This is a significant change from his baseline. Neuro exam does demonstrate some weakness of bilateral lower extremities but otherwise non focal. Cognitive status seems fairly stable from previous visit. We have discussed potential concerns for dehydration related to restarting Lasix on 8/1. After conversation with his wife, she requested that we call EMS for transport to the ER, today. 911 dispatcher stated there were more calls than units available for transport. After waiting an hour and 15 minutes, patient's wife stated she would transport him to ER via private vehicle. Patient was stable for transportation and assisted to car by myself and our registered nurse. Attempted to notify ER of case but no answer with multiple tries. Mrs Oelke was advised to pull to the front of the ER entrance and request help from staff to get him into the ER. She verbalizes understanding and agreement with this plan. Patient had provided urine sample upon arrival. UA and culture sent, today. We will call with results. On call MD notified of case. I will discuss case with Dr Jaynee Eagles once she returns to the  office. We will follow up pending ER eval.    Orders Placed This Encounter  Procedures   Culture, Urine   Urinalysis      No orders of the defined types were placed in this encounter.   I spent 55 minutes of face-to-face and non-face-to-face time with patient.  This included previsit chart review, lab review, study review, order entry, electronic health record documentation, patient education.   Debbora Presto, MSN, FNP-C 07/22/2021, 5:16 PM  [facility] [address], [place], [place] [address] [phone]    I have read the note, and I agree with the clinical assessment and plan.  Richard A. Felecia Shelling, MD, PhD, [facility] Certified in Neurology, Clinical Neurophysiology, Sleep Medicine, Pain Medicine and Neuroimaging  [facility] [address], [place],  [address] [phone]

## 2021-07-22 NOTE — ED Notes (Signed)
Patient leaving, wife states that she has someone elsewhere who is willing to help him.

## 2021-07-22 NOTE — Telephone Encounter (Signed)
Called wife back. Advised per AL,NP that he can keep scheduled appt w/ Amy at 3p, check in 230p. They discussed plan for him. He will need assistance to get into car and out of car once at our office and into building. She will get this set up.

## 2021-07-22 NOTE — Telephone Encounter (Signed)
Called and spoke w/ wife. He is unable to stand/help w/ transfer/dead weight. Advised to keep him safe and others, he needs to be able to ambulate into office or have help w/ transfer. She is going to call EMS to see if they can set him up to be transferred to appt today at 3pm. She will call if anything needs to be changed.

## 2021-07-22 NOTE — ED Notes (Signed)
Patient meets ALONE criteria due to history of dementia, pt's wife given green arm band.

## 2021-07-23 DIAGNOSIS — G20 Parkinson's disease: Secondary | ICD-10-CM | POA: Diagnosis not present

## 2021-07-23 DIAGNOSIS — F0280 Dementia in other diseases classified elsewhere without behavioral disturbance: Secondary | ICD-10-CM | POA: Diagnosis not present

## 2021-07-23 DIAGNOSIS — R531 Weakness: Secondary | ICD-10-CM | POA: Diagnosis not present

## 2021-07-23 DIAGNOSIS — E860 Dehydration: Secondary | ICD-10-CM | POA: Diagnosis not present

## 2021-07-23 LAB — URINALYSIS
Bilirubin, UA: NEGATIVE
Glucose, UA: NEGATIVE
Ketones, UA: NEGATIVE
Leukocytes,UA: NEGATIVE
Nitrite, UA: NEGATIVE
RBC, UA: NEGATIVE
Specific Gravity, UA: 1.026 (ref 1.005–1.030)
Urobilinogen, Ur: 1 mg/dL (ref 0.2–1.0)
pH, UA: 6.5 (ref 5.0–7.5)

## 2021-07-24 ENCOUNTER — Telehealth: Payer: Self-pay | Admitting: *Deleted

## 2021-07-24 DIAGNOSIS — R531 Weakness: Secondary | ICD-10-CM | POA: Diagnosis not present

## 2021-07-24 DIAGNOSIS — F0280 Dementia in other diseases classified elsewhere without behavioral disturbance: Secondary | ICD-10-CM | POA: Diagnosis not present

## 2021-07-24 DIAGNOSIS — G20 Parkinson's disease: Secondary | ICD-10-CM | POA: Diagnosis not present

## 2021-07-24 LAB — URINE CULTURE

## 2021-07-24 NOTE — Telephone Encounter (Signed)
-----   Message from Debbora Presto, NP sent at 07/24/2021  8:01 AM EDT ----- Please let him wife know that the urine did not show any specific bacteria and UA looked good. I see that he has been admitted to [facility] but unable to see updated notes. Please have her update [place] on his status and I will relay to Dr Jaynee Eagles. TY!

## 2021-07-24 NOTE — Telephone Encounter (Signed)
Called and spoke w/ spouse. Relayed results per AL,NP note. She verbalized understanding.   She initially brought him to [facility] but there were "95 people" in line at [facility] and she decided to go to [facility] because she knew someone there that was able to get him in there. He was admitted to [facility]. Did covid-19 test when he first arrived and it was negative. Had PT eval yesterday and she is waiting to hear how this went and what next steps will be. He is sleeping most of the time. Comfortable and eating. Has not been up at all except for PT evaluation.  Has catheter placed. Wife does not know what other tests have been done or results. She is going in today to see how he is doing and to discuss this. She will keep up updated.

## 2021-07-25 DIAGNOSIS — G20 Parkinson's disease: Secondary | ICD-10-CM | POA: Diagnosis not present

## 2021-07-25 DIAGNOSIS — F0280 Dementia in other diseases classified elsewhere without behavioral disturbance: Secondary | ICD-10-CM | POA: Diagnosis not present

## 2021-07-25 DIAGNOSIS — R531 Weakness: Secondary | ICD-10-CM | POA: Diagnosis not present

## 2021-07-26 DIAGNOSIS — R41841 Cognitive communication deficit: Secondary | ICD-10-CM | POA: Diagnosis not present

## 2021-07-26 DIAGNOSIS — R42 Dizziness and giddiness: Secondary | ICD-10-CM | POA: Diagnosis not present

## 2021-07-26 DIAGNOSIS — Z66 Do not resuscitate: Secondary | ICD-10-CM | POA: Diagnosis not present

## 2021-07-26 DIAGNOSIS — R279 Unspecified lack of coordination: Secondary | ICD-10-CM | POA: Diagnosis not present

## 2021-07-26 DIAGNOSIS — Z743 Need for continuous supervision: Secondary | ICD-10-CM | POA: Diagnosis not present

## 2021-07-26 DIAGNOSIS — R001 Bradycardia, unspecified: Secondary | ICD-10-CM | POA: Diagnosis not present

## 2021-07-26 DIAGNOSIS — H04123 Dry eye syndrome of bilateral lacrimal glands: Secondary | ICD-10-CM | POA: Diagnosis not present

## 2021-07-26 DIAGNOSIS — R531 Weakness: Secondary | ICD-10-CM | POA: Diagnosis not present

## 2021-07-26 DIAGNOSIS — R296 Repeated falls: Secondary | ICD-10-CM | POA: Diagnosis not present

## 2021-07-26 DIAGNOSIS — M6281 Muscle weakness (generalized): Secondary | ICD-10-CM | POA: Diagnosis not present

## 2021-07-26 DIAGNOSIS — R5381 Other malaise: Secondary | ICD-10-CM | POA: Diagnosis not present

## 2021-07-26 DIAGNOSIS — H1013 Acute atopic conjunctivitis, bilateral: Secondary | ICD-10-CM | POA: Diagnosis not present

## 2021-07-26 DIAGNOSIS — B351 Tinea unguium: Secondary | ICD-10-CM | POA: Diagnosis not present

## 2021-07-26 DIAGNOSIS — R52 Pain, unspecified: Secondary | ICD-10-CM | POA: Diagnosis not present

## 2021-07-26 DIAGNOSIS — E860 Dehydration: Secondary | ICD-10-CM | POA: Diagnosis not present

## 2021-07-26 DIAGNOSIS — E538 Deficiency of other specified B group vitamins: Secondary | ICD-10-CM | POA: Diagnosis not present

## 2021-07-26 DIAGNOSIS — Z7409 Other reduced mobility: Secondary | ICD-10-CM | POA: Diagnosis not present

## 2021-07-26 DIAGNOSIS — R600 Localized edema: Secondary | ICD-10-CM | POA: Diagnosis not present

## 2021-07-26 DIAGNOSIS — H1010 Acute atopic conjunctivitis, unspecified eye: Secondary | ICD-10-CM | POA: Diagnosis not present

## 2021-07-26 DIAGNOSIS — F0280 Dementia in other diseases classified elsewhere without behavioral disturbance: Secondary | ICD-10-CM | POA: Diagnosis not present

## 2021-07-26 DIAGNOSIS — Z79899 Other long term (current) drug therapy: Secondary | ICD-10-CM | POA: Diagnosis not present

## 2021-07-26 DIAGNOSIS — F0281 Dementia in other diseases classified elsewhere with behavioral disturbance: Secondary | ICD-10-CM | POA: Diagnosis not present

## 2021-07-26 DIAGNOSIS — G20 Parkinson's disease: Secondary | ICD-10-CM | POA: Diagnosis not present

## 2021-07-29 DIAGNOSIS — H1013 Acute atopic conjunctivitis, bilateral: Secondary | ICD-10-CM | POA: Diagnosis not present

## 2021-07-29 DIAGNOSIS — R42 Dizziness and giddiness: Secondary | ICD-10-CM | POA: Diagnosis not present

## 2021-07-29 DIAGNOSIS — F0280 Dementia in other diseases classified elsewhere without behavioral disturbance: Secondary | ICD-10-CM | POA: Diagnosis not present

## 2021-07-29 DIAGNOSIS — B351 Tinea unguium: Secondary | ICD-10-CM | POA: Diagnosis not present

## 2021-07-29 DIAGNOSIS — R531 Weakness: Secondary | ICD-10-CM | POA: Diagnosis not present

## 2021-07-29 DIAGNOSIS — R5381 Other malaise: Secondary | ICD-10-CM | POA: Diagnosis not present

## 2021-07-29 DIAGNOSIS — G20 Parkinson's disease: Secondary | ICD-10-CM | POA: Diagnosis not present

## 2021-07-29 DIAGNOSIS — R001 Bradycardia, unspecified: Secondary | ICD-10-CM | POA: Diagnosis not present

## 2021-07-30 ENCOUNTER — Telehealth: Payer: Self-pay | Admitting: Family Medicine

## 2021-07-30 NOTE — Telephone Encounter (Signed)
noted 

## 2021-07-30 NOTE — Telephone Encounter (Signed)
FYI wife wanted it noted that pt is in [facility] since Friday of last due to being unable to walk or stand, no call back requested.

## 2021-08-05 DIAGNOSIS — R531 Weakness: Secondary | ICD-10-CM | POA: Diagnosis not present

## 2021-08-05 DIAGNOSIS — R001 Bradycardia, unspecified: Secondary | ICD-10-CM | POA: Diagnosis not present

## 2021-08-05 DIAGNOSIS — R42 Dizziness and giddiness: Secondary | ICD-10-CM | POA: Diagnosis not present

## 2021-08-05 DIAGNOSIS — F0280 Dementia in other diseases classified elsewhere without behavioral disturbance: Secondary | ICD-10-CM | POA: Diagnosis not present

## 2021-08-05 DIAGNOSIS — B351 Tinea unguium: Secondary | ICD-10-CM | POA: Diagnosis not present

## 2021-08-05 DIAGNOSIS — R5381 Other malaise: Secondary | ICD-10-CM | POA: Diagnosis not present

## 2021-08-05 DIAGNOSIS — H1010 Acute atopic conjunctivitis, unspecified eye: Secondary | ICD-10-CM | POA: Diagnosis not present

## 2021-08-05 DIAGNOSIS — G20 Parkinson's disease: Secondary | ICD-10-CM | POA: Diagnosis not present

## 2021-08-05 DIAGNOSIS — R600 Localized edema: Secondary | ICD-10-CM | POA: Diagnosis not present

## 2021-08-09 DIAGNOSIS — H04123 Dry eye syndrome of bilateral lacrimal glands: Secondary | ICD-10-CM | POA: Diagnosis not present

## 2021-08-12 ENCOUNTER — Telehealth: Payer: Self-pay | Admitting: Family Medicine

## 2021-08-12 DIAGNOSIS — R42 Dizziness and giddiness: Secondary | ICD-10-CM | POA: Diagnosis not present

## 2021-08-12 DIAGNOSIS — Z66 Do not resuscitate: Secondary | ICD-10-CM | POA: Diagnosis not present

## 2021-08-12 DIAGNOSIS — M6281 Muscle weakness (generalized): Secondary | ICD-10-CM | POA: Diagnosis not present

## 2021-08-12 DIAGNOSIS — F0281 Dementia in other diseases classified elsewhere with behavioral disturbance: Secondary | ICD-10-CM | POA: Diagnosis not present

## 2021-08-12 DIAGNOSIS — R001 Bradycardia, unspecified: Secondary | ICD-10-CM | POA: Diagnosis not present

## 2021-08-12 DIAGNOSIS — Z79899 Other long term (current) drug therapy: Secondary | ICD-10-CM | POA: Diagnosis not present

## 2021-08-12 DIAGNOSIS — B351 Tinea unguium: Secondary | ICD-10-CM | POA: Diagnosis not present

## 2021-08-12 DIAGNOSIS — E538 Deficiency of other specified B group vitamins: Secondary | ICD-10-CM | POA: Diagnosis not present

## 2021-08-12 NOTE — Telephone Encounter (Signed)
Called the patient 's wife and advised that Amy is ok with signing for a placard. Will complete and mail to the patient's home. Address confirmed

## 2021-08-12 NOTE — Telephone Encounter (Signed)
Pt's wife, Ardit Biskup (on [place]) called, would like a call from the nurse to discuss how to get a handicap placard.

## 2021-08-13 ENCOUNTER — Other Ambulatory Visit: Payer: Self-pay

## 2021-08-13 NOTE — Patient Outreach (Signed)
[facility]) Care Management  08/13/2021  Kyle Mcguire 21-Nov-1941 [id number]     Transition of Care Referral  Referral Date: 08/13/2021 Referral Source: [employer] D/C Report Date of Discharge: 08/12/2021 Facility: [facility]   Outreach attempt # 1 to patient. Spouse with spouse (DPR on file) given patient's dementia diagnosis. She reports that patient is doing much better and making progress. She confirms she has meds and no changes to med regimen was made. He is scheduled to get HHPT tomorrow. Spouse admits that she is "not fond of letting folks come into the home"  but is making an exception. Patient has been up walking well with walker. No MD follow up appts were made. She will call both PCP and neuro to see if patient needs to be seen and is able to take him if so. [facility] services  and TOC calls reviewed and discussed. Spouse declined any needs and did not wish for future ongoing calls as did not think they would be beneficial and they both are HOH. She was provided with RN CM contact info to call back if needed.     Plan: RN CM will close referral.    Enzo Montgomery, RN,BSN,CCM [facility] Telephonic Care Management Coordinator Direct Phone: [phone] Toll Free: [phone] Fax: [phone]

## 2021-08-14 ENCOUNTER — Telehealth: Payer: Self-pay | Admitting: Family Medicine

## 2021-08-14 NOTE — Telephone Encounter (Signed)
FYI wife wanted it known that pt is home from 2 weeks of rehab, he is home and doing well, no call back requested.

## 2021-08-15 ENCOUNTER — Ambulatory Visit: Payer: Medicare Other | Admitting: Speech Pathology

## 2021-08-15 ENCOUNTER — Encounter: Payer: Self-pay | Admitting: Speech Pathology

## 2021-08-15 ENCOUNTER — Other Ambulatory Visit: Payer: Self-pay

## 2021-08-15 ENCOUNTER — Encounter: Payer: Self-pay | Admitting: Physical Therapy

## 2021-08-15 ENCOUNTER — Ambulatory Visit: Payer: Medicare Other | Admitting: Occupational Therapy

## 2021-08-15 ENCOUNTER — Ambulatory Visit: Payer: Medicare Other | Attending: Geriatric Medicine | Admitting: Physical Therapy

## 2021-08-15 DIAGNOSIS — F0280 Dementia in other diseases classified elsewhere without behavioral disturbance: Secondary | ICD-10-CM | POA: Insufficient documentation

## 2021-08-15 DIAGNOSIS — R29818 Other symptoms and signs involving the nervous system: Secondary | ICD-10-CM

## 2021-08-15 DIAGNOSIS — R262 Difficulty in walking, not elsewhere classified: Secondary | ICD-10-CM | POA: Diagnosis present

## 2021-08-15 DIAGNOSIS — R278 Other lack of coordination: Secondary | ICD-10-CM | POA: Insufficient documentation

## 2021-08-15 DIAGNOSIS — G20 Parkinson's disease: Secondary | ICD-10-CM | POA: Diagnosis present

## 2021-08-15 DIAGNOSIS — R2681 Unsteadiness on feet: Secondary | ICD-10-CM | POA: Insufficient documentation

## 2021-08-15 DIAGNOSIS — M6281 Muscle weakness (generalized): Secondary | ICD-10-CM

## 2021-08-15 DIAGNOSIS — R41841 Cognitive communication deficit: Secondary | ICD-10-CM | POA: Insufficient documentation

## 2021-08-15 DIAGNOSIS — R296 Repeated falls: Secondary | ICD-10-CM | POA: Insufficient documentation

## 2021-08-15 DIAGNOSIS — R2689 Other abnormalities of gait and mobility: Secondary | ICD-10-CM | POA: Diagnosis present

## 2021-08-15 NOTE — Therapy (Signed)
[place]. [place], [place], [address] Phone: [phone]   Fax:  [phone]  Occupational Therapy Evaluation  Patient Details  Name: Kyle Mcguire MRN: [id number] Date of Birth: 1941/03/14 Referring Provider (OT): Leander Rams, MD   Encounter Date: 08/15/2021   OT End of Session - 08/15/21 1451     Visit Number 1    Number of Visits 5    Date for OT Re-Evaluation 10/14/21    Authorization Type [employer] Medicare    Authorization Time Period VL: [place]    Progress Note Due on Visit 10    OT Start Time 1445    OT Stop Time 1530    OT Time Calculation (min) 45 min    Activity Tolerance Patient tolerated treatment well    Behavior During Therapy [facility] for tasks assessed/performed            Past Medical History:  Diagnosis Date   Dementia ([place])    Hearing loss    Parkinson's disease ([place])     Past Surgical History:  Procedure Laterality Date   no surgical history per patient     None      There were no vitals filed for this visit.   Subjective Assessment - 08/15/21 1448     Subjective  "I'm here for speech therapy, balance, and stuff like that"    Pertinent History Parkinson's disease (dx 3 years ago, per chart review) w/ associated dementia; shuffling gait    Patient Stated Goals Balance, handwriting    Currently in Pain? No/denies             [facility] OT Assessment - 08/15/21 1451       Assessment   Medical Diagnosis PD; dementia    Referring Provider (OT) Leander Rams, MD    Hand Dominance Right    Prior Therapy Yes      Precautions   Precautions Fall      Balance Screen   Has the patient fallen in the past 6 months Yes    How many times? 1   in the bathroom     Home  Environment   Type of [place] Other (Comment)   1 step threshold   Home Layout One level    Bathroom Shower/Tub Engineer, structural seat;Walker - 4 wheels;Cane -quad    Additional Comments Uses rollator for community mobility and quad cane for in-home mobility, per pt report    Lives With Spouse   Wife (     Prior Function   Level of Independence Other (comment)   SPV-Mod I w/ BADLs   Vocation Retired      ADL   Eating/Feeding Independent    Upper Body Bathing Supervision/safety   Simulated UB/LB bathing in unsupported sitting   Lower Body Bathing Supervision/safety    Upper Body Dressing Supervision/safety;Needs assist for fasteners   Simulated w/ button-up shirt   Lower Body Dressing Supervision/safety   Simulated in unsupported sitting; donning/doffing shoes w/ Mod I   Toilet Transfer Modified independent   per pt report   Toileting -  Hygiene Modified Independent   per pt report   Tub/Shower Transfer Minimal assistance    Tub/Shower Transfer Equipment Walk in shower;Increased time;Shower seat without back      IADL   Prior Level of Function Light Housekeeping Light daily  tasks (e.g. bed making)    Light Housekeeping Performs light daily tasks such as dishwashing, bed making    Prior Level of Function Medication Managment Wife completed med mgmt      Mobility   Mobility Status Needs assist;History of falls    Mobility Status Comments Uses rollator and quad cane      Written Expression   Dominant Hand Right    Handwriting 25% legible;Mild micrographia   Completed 1 sentence in 42 seconds     Vision - History   Baseline Vision Wears glasses only for reading    Additional Comments Hx of cataract surgery (L)      Cognition   Area of Impairment Memory;Safety/judgement;Awareness;Problem solving    Safety/Judgement Decreased awareness of safety;Decreased awareness of deficits    Awareness Emergent;Anticipatory      Observation/Other Assessments   Outcome Measures Trail Making Test: Part A in 52 sec w/ 2 errors      Sensation   Additional Comments No deficits, per pt report       Coordination   Gross Motor Movements are Fluid and Coordinated Yes    Fine Motor Movements are Fluid and Coordinated No    Finger Nose Finger Test WNL    9 Hole Peg Test Right;Left    Right 9 Hole Peg Test 31 sec    Left 9 Hole Peg Test 40 sec      ROM / Strength   AROM / PROM / Strength AROM      AROM   Overall AROM  Within functional limits for tasks performed    AROM Assessment Site Shoulder;Elbow;Forearm;Wrist      Strength   Overall Strength Comments To be further assessed in functional context      Hand Function   Comment To be further assessed in functional context             OT Education - 08/15/21 1450     Education Details Education provided on role and purpose of OT    Person(s) Educated Patient    Methods Explanation    Comprehension Verbalized understanding             OT Short Term Goals - 08/15/21 1644       OT SHORT TERM GOAL #1   Title Pt will demo independence w/ HEP designed for coordination to improve participation in functional FM tasks (e.g., clothing manipulatives, handwriting)    Baseline No HEP at this time    Time 2    Period Weeks    Status New    Target Date 08/30/21      OT SHORT TERM GOAL #2   Title Pt will independently incorporate compensatory strategies, including AE prn, during handwriting tasks    Baseline Decreased knowledge of compensatory strategies/AE    Time 2    Period Weeks    Status New             OT Long Term Goals - 08/15/21 1645       OT LONG TERM GOAL #1   Title Pt will demonstrate improved working memory and Secretary/administrator as evidenced by completing Trail Making Test: Part A w/ no errors    Baseline TMT: Part A in 52 sec w/ 2 errors    Time 4    Period Weeks    Status New    Target Date 09/13/21      OT LONG TERM GOAL #2   Title Pt will be able to  write 1 sentence w/ at least 50% legibility, using compensatory strategies and AE prn    Baseline 25% legibility w/ mild micrographia    Time 4     Period Weeks    Status New             Plan - 08/15/21 1630     Clinical Impression Statement Pt is a 80 y/o male who presents to OP OT due to Parkinson's disease and associated dementia w/ recent onset of increased weakness and decreased safety w/ ADLs. Pt currently lives with his wife in a single-level home and is retired. Pt exhibits adequate BUE ROM and GMC and was able to demo simulated BADLs w/ SPV/safety at this time. Pt demo'd limitations w/ safety and balance, mildly decreased coordination, and reports desire to address handwriting; pt will benefit from skilled occupational therapy services to address strength and coordination, functional balance and mobility, [place], safety awareness, introduction of compensatory strategies/AE prn, and implementation of an HEP to improve participation and safety during functional FM skills and ADLs.    OT Occupational Profile and History Detailed Assessment- Review of Records and additional review of physical, cognitive, psychosocial history related to current functional performance    Occupational performance deficits (Please refer to evaluation for details): IADL's;Leisure;Social Participation;ADL's    Body Structure / Function / Physical Skills Strength;Balance;Dexterity;UE functional use;IADL;Coordination;Mobility;FMC;ADL    Cognitive Skills Memory;Problem Solve;Safety Awareness    Psychosocial Skills Environmental  Adaptations    Rehab Potential Good    Clinical Decision Making Several treatment options, min-mod task modification necessary    Comorbidities Affecting Occupational Performance: May have comorbidities impacting occupational performance    Modification or Assistance to Complete Evaluation  Min-Moderate modification of tasks or assist with assess necessary to complete eval    OT Frequency 1x / week    OT Duration 4 weeks    OT Treatment/Interventions Self-care/ADL training;DME and/or AE instruction;Balance training;Therapeutic  activities;Therapeutic exercise;Cognitive remediation/compensation;Functional Mobility Training;Patient/family education;Energy conservation    Plan Introduce coordination HEP    Recommended Other Services Pt is currently receiving PT/ST services at this clinic    Consulted and Agree with Plan of Care Patient             Patient will benefit from skilled therapeutic intervention in order to improve the following deficits and impairments:   Body Structure / Function / Physical Skills: Strength, Balance, Dexterity, UE functional use, IADL, Coordination, Mobility, FMC, ADL Cognitive Skills: Memory, Problem Solve, Safety Awareness Psychosocial Skills: Environmental  Adaptations   Visit Diagnosis: Other lack of coordination  Other symptoms and signs involving the nervous system  Muscle weakness (generalized)  Other abnormalities of gait and mobility    Problem List Patient Active Problem List   Diagnosis Date Noted   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 [place], [place], [address] Phone: [phone]   Fax:  [phone]  Name: MELODY KIRCHER MRN: [id number] Date of Birth: 04-Feb-1941

## 2021-08-15 NOTE — Therapy (Signed)
[place]. [place], [place], [address] Phone: [phone]   Fax:  [phone]  Speech Language Pathology Evaluation  Patient Details  Name: Kyle Mcguire MRN: [id number] Date of Birth: 12-30-1940 Referring Provider (SLP): Rickard Patience MD   Encounter Date: 08/15/2021   End of Session - 08/15/21 1407     Visit Number 1    Number of Visits 9    Date for SLP Re-Evaluation 10/10/21    SLP Start Time 1315    SLP Stop Time  [id number]    SLP Time Calculation (min) 40 min    Activity Tolerance Patient tolerated treatment well             Past Medical History:  Diagnosis Date   Dementia ([place])    Hearing loss    Parkinson's disease ([place])     Past Surgical History:  Procedure Laterality Date   no surgical history per patient     None      There were no vitals filed for this visit.   Subjective Assessment - 08/15/21 1405     Subjective Pt was cooperative throughout evaluation.    Currently in Pain? No/denies                SLP Evaluation [facility] - 08/15/21 1517       SLP Visit Information   SLP Received On 08/15/21    Referring Provider (SLP) Rickard Patience MD    Onset Date Reportedly 2016    Medical Diagnosis Parkinson's Disease      Subjective   Patient/Family Stated Goal "to play golf again"      General Information   HPI Pt is a 80 yo male w/ Parkinson's Disease and related dementia. Per chart review, pt has had continued decline of cognitive skills. Was independently with ADLs prior to hospital admission on 07/22/21. Pt reports wife is responsible for finances and medication management.      Balance Screen   Has the patient fallen in the past 6 months Yes    How many times? 1    Has the patient had a decrease in activity level because of a fear of falling?  Yes    Is the patient reluctant to leave their home because of a fear of falling?  No      Prior Functional  Status   Cognitive/Linguistic Baseline Baseline deficits    Baseline deficit details Cognitive decline since PD dx.    Type of Home House     Lives With Spouse    Available Support Family    Education HS    Vocation Retired      Associate Professor   Overall Cognitive Status Impaired/Different from baseline    Area of Impairment Attention;Memory;Orientation;Following commands;Awareness;Safety/judgement    Orientation Level Disoriented to;Person;Place;Time;Situation   Unoriented to year, age, place. Requires cueing for situation.   Attention Selective;Alternating;Divided    Selective Attention Impaired    Selective Attention Impairment Verbal basic;Verbal complex;Functional basic;Functional complex    Alternating Attention Impaired    Alternating Attention Impairment Verbal basic;Verbal complex;Functional basic;Functional complex    Divided Attention Impaired    Divided Attention Impairment Verbal basic;Verbal complex;Functional complex;Functional basic    Memory Impaired    Memory Impairment Decreased recall of new information;Decreased short term memory    Awareness Impaired    Awareness Impairment Emergent impairment    Problem Solving Impaired    Problem Solving Impairment Verbal basic;Verbal complex;Functional basic;Functional complex  Executive Function Reasoning;Decision Making;Self Monitoring;Self Correcting;Sequencing;Organizing      Auditory Comprehension   Overall Auditory Comprehension Appears within functional limits for tasks assessed    Interfering Components Hearing      Verbal Expression   Overall Verbal Expression Appears within functional limits for tasks assessed      Standardized Assessments   Standardized Assessments  Other Assessment   CADL-3   Other Assessment CADL-3                             SLP Education - 08/15/21 1525     Education Details cognitive-communication impairment    Person(s) Educated Patient    Methods  Explanation;Demonstration    Comprehension Need further instruction;Verbal cues required              SLP Short Term Goals - 08/15/21 1514       SLP SHORT TERM GOAL #1   Title Pt/caregiver will demonstrate orientation x4 with modA verbal cues to refer to compensatory aids (i.e. calendar).    Time 4    Period Weeks    Status New    Target Date 09/12/21      SLP SHORT TERM GOAL #2   Title Pt/caregiver will recall 3 memory strategies that can assist with recall of important information.    Time 4    Period Weeks    Status New    Target Date 09/12/21      SLP SHORT TERM GOAL #3   Title Pt/caregiver will participate in educational session regarding resources for those with Parkinson's Disease.    Time 4    Period Weeks    Status New    Target Date 09/12/21              SLP Long Term Goals - 08/15/21 1510       SLP LONG TERM GOAL #1   Title Patient will demonstrate orientation x4 with sup A verbal cues to refer to compensatory aids (i.e. calendar).    Time 8    Period Weeks    Status New    Target Date 10/10/21      SLP LONG TERM GOAL #2   Title Pt/caregiver will report successful use of memory aids to recall important information.    Time 8    Period Weeks    Target Date 10/10/21      SLP LONG TERM GOAL #3   Title Pt/caregiver will list 2-3 resources that are available for persons with Parkinson's Disease.    Time 8    Period Weeks    Status New    Target Date 10/10/21              Plan - 08/15/21 1407     Clinical Impression Statement Pt is a 80 yo male w/ dx of Parkinson's Disease and related dementia. Pt was not adequately able to describe deficits or reasoning for referral. Wife was not available to provide hx. Pt was unoriented to year and self (age). Informally, pt was unable to consistently answer basic, open-ended questions such as, "What do you like to do?", reporting, "i don't know". 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 He reported that he has been married to his wife, [name], for 72 years (pt is 82).  SLP administered the CADL-3 to assess pt's functional communication skills. Pt scored a 76/100  revealing the pt's functional abilities to be in <2%tile. Overall, pt's level of functional communication seems to be moderately impaired. Pt exhibited the most difficulty in the reading, writing & using numbers and contextual communication sections of the CADL-3. Pt had difficulty with memory, using a calendar, following instructions, inductive reasoning given contextual information, and indicating and telling time. Pt was stimulable with max cues to complete the written calendar portion of the assessment. SLP will further assess cognitive skills using SLUMS to have as baseline data. SLP rec skilled speech services to address cognitive-communication disorder by targeting external memory aids and caregiver education.    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 [place], [place],  [address] Phone: [phone]   Fax:  [phone]  Name: Kyle Mcguire MRN: [id number] Date of Birth: 07/18/1941

## 2021-08-15 NOTE — Therapy (Signed)
[place]. [place], [place], [address] Phone: [phone]   Fax:  [phone]  Physical Therapy Evaluation  Patient Details  Name: Kyle Mcguire MRN: [id number] Date of Birth: [birth year] Referring Provider (PT): Earnest Conroy Date: 08/15/2021   PT End of Session - 08/15/21 1427     Visit Number 1    Number of Visits 12    Date for PT Re-Evaluation 11/14/21    Authorization Type [employer] medicare    PT Start Time [id number]    PT Stop Time 1436    PT Time Calculation (min) 38 min    Activity Tolerance Patient tolerated treatment well    Behavior During Therapy [facility] for tasks assessed/performed             Past Medical History:  Diagnosis Date   Dementia ([place])    Hearing loss    Parkinson's disease ([place])     Past Surgical History:  Procedure Laterality Date   no surgical history per patient     None      There were no vitals filed for this visit.    Subjective Assessment - 08/15/21 1355     Subjective I saw this patient a little over a year ago for frequent falls.  He reports that he has continued to have some falls but recently was admitted to the hospital trhough the ED due to difficulty walking, weakness and dizziness.  Has a history of Dementia and Parkinson's.  He reports that he was in the hospital for about 3 weeks.  He reports that he went home on Sunday and lives wiht his wife.    Pertinent History Parkinsons, orthostatic hypotension, dementia    Limitations Walking    Patient Stated Goals walk and move better, no falls, feel stronger    Currently in Pain? No/denies                [facility] PT Assessment - 08/15/21 0001       Assessment   Medical Diagnosis weakness, falls, difficulty walking    Referring Provider (PT) Jacelyn Grip    Onset Date/Surgical Date 08/08/21    Prior Therapy over a year ago      Balance Screen   Has the patient fallen in the past 6 months Yes    How many times? 1     Has the patient had a decrease in activity level because of a fear of falling?  No    Is the patient reluctant to leave their home because of a fear of falling?  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  Slow gait really festinates when turning, gets the very small shuffles      Standardized Balance Assessment   Standardized Balance Assessment Berg Balance Test;Timed Up and Go Test      Berg Balance Test   Sit to Stand Able to stand without using hands and stabilize independently  Standing Unsupported Able to stand safely 2 minutes    Sitting with Back Unsupported but Feet Supported on Floor or Stool Able to sit safely and securely 2 minutes    Stand to Sit Sits safely with minimal use of hands    Transfers Able to transfer safely, definite need of hands    Standing Unsupported with Eyes Closed Able to stand 10 seconds safely    Standing Unsupported with Feet Together Able to place feet together independently and stand for 1 minute with supervision    From Standing, Reach Forward with Outstretched Arm Can reach forward >12 cm safely (5")    From Standing Position, Pick up Object from Floor Able to pick up shoe safely and easily    From Standing Position, Turn to Look Behind Over each Shoulder Turn sideways only but maintains balance    Turn 360 Degrees Able to turn 360 degrees safely but slowly    Standing Unsupported, Alternately Place Feet on Step/Stool Able to stand independently and complete 8 steps >20 seconds    Standing Unsupported, One Foot in Front Able to plae foot ahead of  the other independently and hold 30 seconds    Standing on One Leg Able to lift leg independently and hold 5-10 seconds    Total Score 46      Timed Up and Go Test   Normal TUG (seconds) 29    TUG Comments with and without a cane, biggest issue is the turns                        Objective measurements completed on examination: See above findings.                 PT Short Term Goals - 08/15/21 1432       PT SHORT TERM GOAL #1   Title decrease TUG time to 23 seconds    Time 4    Period Weeks    Status New               PT Long Term Goals - 08/15/21 1432       PT LONG TERM GOAL #1   Title I with advanced HEP to include balance and flexibility    Time 12    Period Weeks    Status New      PT LONG TERM GOAL #2   Title decrease TUG to 14 seconds    Time 12    Period Weeks    Status New      PT LONG TERM GOAL #3   Title increase Berg balance score to 49/56    Time 12    Period Weeks    Status New      PT LONG TERM GOAL #4   Title demonstrate a transfer from one sitting surface to another 10 feet away with no shuffling pattern noted    Time 12    Period Weeks    Status New      PT LONG TERM GOAL #5   Title pt will reports no loss of control of gait speed when ambulating down inclines during his walking    Time 12    Period Weeks    Status New                    Plan - 08/15/21 1428     Clinical Impression Statement [name] reports he had to go to the hospital in  early August he woke up dizzy, felt weak and was having difficulty walking, he reports that he is no longer dizzy and does not feel as weak.  I saw him about 14 months ago and his TUG time is slower, his Merrilee Jansky is the same.  His strength is good.  Biggest issue is when he turns or transfers, he really stutter steps and seems unsafe, straight line walking he does well, he uses a 4WW but has a cane and when I asked him to use it he carried it.    Personal Factors and  Comorbidities Comorbidity 3+    Comorbidities orthostatic hypotension, Parkinsons dx, dementia    Examination-Activity Limitations Locomotion Level    Stability/Clinical Decision Making Evolving/Moderate complexity    Clinical Decision Making Low    Rehab Potential Good    PT Frequency 1x / week    PT Duration 12 weeks    PT Treatment/Interventions Gait training;Stair training;Functional mobility training;Patient/family education;Taping;Therapeutic activities;Passive range of motion;Therapeutic exercise;Balance training;Neuromuscular re-education;Manual techniques    PT Next Visit Plan really focus on balance and safety with walking             Patient will benefit from skilled therapeutic intervention in order to improve the following deficits and impairments:  Decreased range of motion, Difficulty walking, Impaired flexibility, Decreased balance, Abnormal gait, Decreased coordination, Pain, Decreased mobility, Decreased strength  Visit Diagnosis: Repeated falls - Plan: PT plan of care cert/re-cert  Unsteady gait - Plan: PT plan of care cert/re-cert  Difficulty in walking, not elsewhere classified - Plan: PT plan of care cert/re-cert  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 [place], [place], [address] Phone: [phone]   Fax:  [phone]  Name: DECLEN HOMRICH MRN: [id number] Date of Birth: 10-14-1941

## 2021-08-21 ENCOUNTER — Other Ambulatory Visit: Payer: Self-pay

## 2021-08-21 ENCOUNTER — Ambulatory Visit: Payer: Medicare Other | Admitting: Physical Therapy

## 2021-08-21 ENCOUNTER — Ambulatory Visit: Payer: Medicare Other | Admitting: Speech Pathology

## 2021-08-21 ENCOUNTER — Ambulatory Visit: Payer: Medicare Other | Admitting: Occupational Therapy

## 2021-08-21 DIAGNOSIS — R41841 Cognitive communication deficit: Secondary | ICD-10-CM

## 2021-08-21 DIAGNOSIS — R278 Other lack of coordination: Secondary | ICD-10-CM

## 2021-08-21 DIAGNOSIS — R29818 Other symptoms and signs involving the nervous system: Secondary | ICD-10-CM

## 2021-08-21 DIAGNOSIS — R296 Repeated falls: Secondary | ICD-10-CM | POA: Diagnosis not present

## 2021-08-21 DIAGNOSIS — R2689 Other abnormalities of gait and mobility: Secondary | ICD-10-CM

## 2021-08-21 DIAGNOSIS — M6281 Muscle weakness (generalized): Secondary | ICD-10-CM

## 2021-08-21 NOTE — Therapy (Signed)
[place]. [place], [place], [address] Phone: [phone]   Fax:  [phone]  Speech Language Pathology Treatment  Patient Details  Name: Kyle Mcguire MRN: [id number] Date of Birth: [birth year] Referring Provider (SLP): Rickard Patience MD   Encounter Date: 08/21/2021   End of Session - 08/21/21 1543     Visit Number 2    Number of Visits 9    Date for SLP Re-Evaluation 10/10/21    SLP Start Time 1315    SLP Stop Time  [id number]    SLP Time Calculation (min) 40 min    Activity Tolerance Patient tolerated treatment well             Past Medical History:  Diagnosis Date   Dementia ([place])    Hearing loss    Parkinson's disease ([place])     Past Surgical History:  Procedure Laterality Date   no surgical history per patient     None      There were no vitals filed for this visit.          ADULT SLP TREATMENT - 08/21/21 1514       General Information   Behavior/Cognition Cooperative;Pleasant mood      Treatment Provided   Treatment provided Cognitive-Linquistic      Cognitive-Linquistic Treatment   Treatment focused on Cognition    Skilled Treatment SLP trained pt on external memory strategies and WRAP strategies to assist with memory deficits. SLP provided pt with contextual scenarios and requested examples of how he may implement given memory strategy. Pt was unable to generate situations for where memory strategies would be helpful in daily life. Required maxA to generate situations. SLP encouraged pt to utilize a memory notebook to assist with memory deficits. Encouraged pt ask wife to attend sessions to increase liklihood of strategy carryover.      Progression Toward Goals   Progression toward goals Progressing toward goals                SLP Short Term Goals - 08/21/21 1537       SLP SHORT TERM GOAL #1   Title Pt/caregiver will demonstrate orientation x4 with modA  verbal cues to refer to compensatory aids (i.e. calendar).    Time 4    Period Weeks    Status On-going    Target Date 09/12/21      SLP SHORT TERM GOAL #2   Title Pt/caregiver will recall 3 memory strategies that can assist with recall of important information.    Time 4    Period Weeks    Status On-going    Target Date 09/12/21      SLP SHORT TERM GOAL #3   Title Pt/caregiver will participate in educational session regarding resources for those with Parkinson's Disease.    Time 4    Period Weeks    Status On-going    Target Date 09/12/21              SLP Long Term Goals - 08/21/21 1539       SLP LONG TERM GOAL #1   Title Patient will demonstrate orientation x4 with sup A verbal cues to refer to compensatory aids (i.e. calendar).    Time 8    Period Weeks    Status On-going      SLP LONG TERM GOAL #2   Title Pt/caregiver will report successful use of memory aids to recall important information.  Time 8    Period Weeks    Status On-going      SLP LONG TERM GOAL #3   Title Pt/caregiver will list 2-3 resources that are available for persons with Parkinson's Disease.    Time 8    Period Weeks    Status On-going              Plan - 08/21/21 1535     Clinical Impression Statement Pt was responsive to external memory strategy edu. Pt plans to implement external strategies at home (electronic reminders to take medications; memory book) to support memory with iADLs. Pt to bring wife next sesssion. Cont with current POC.    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 Communication Sciences and Disorders  08/21/2021, 3:57 PM  [place]. [place], [place], [address] Phone: [phone]   Fax:  [phone]   Name: Kyle Mcguire MRN: [id number] Date of Birth: 03-09-1941

## 2021-08-22 NOTE — Therapy (Signed)
[facility] [place]. [place], [place], [address] Phone: [phone]   Fax:  [phone]  Patient Details  Name: Kyle Mcguire MRN: [id number] Date of Birth: [birth year] Referring Provider:  Vernie Shanks, MD  Encounter Date: 08/21/2021  At start of session, pt reported he was not feeling well (see 'Subjective Assessment' below). OT inquired whether pt would like to continue w/ therapy or conclude session and pt requested to call his wife to be picked up. After being unable to reach pt's wife, pt verbalized he would like to attempt to continue w/ OT session and try to call his wife again if he continued to feel unwell. After completing 1 set of coordination activities, pt reported he still did not feel well and would like to end session; denied wanting to seek add'l care. OT contacted pt's wife by phone and she arrived shortly after. Session was concluded early.    Subjective Assessment - 08/21/21 1410     Subjective  "My stomach feels a little upset"    Pertinent History Parkinson's disease (dx 3 years ago, per chart review) w/ associated dementia; shuffling gait    Patient Stated Goals Balance, handwriting    Currently in Pain? Other (Comment)   "In my bowels"         Kathrine Cords, OTR/L, MSOT 08/22/2021, 4:12 PM  [place]. [place], [place], [address] Phone: [phone]   Fax:  [phone]

## 2021-08-29 ENCOUNTER — Encounter: Payer: Self-pay | Admitting: Physical Therapy

## 2021-08-29 ENCOUNTER — Ambulatory Visit: Payer: Medicare Other | Admitting: Occupational Therapy

## 2021-08-29 ENCOUNTER — Encounter: Payer: Self-pay | Admitting: Speech Pathology

## 2021-08-29 ENCOUNTER — Ambulatory Visit: Payer: Medicare Other | Admitting: Speech Pathology

## 2021-08-29 ENCOUNTER — Ambulatory Visit: Payer: Medicare Other | Admitting: Physical Therapy

## 2021-08-29 ENCOUNTER — Other Ambulatory Visit: Payer: Self-pay

## 2021-08-29 DIAGNOSIS — R296 Repeated falls: Secondary | ICD-10-CM

## 2021-08-29 DIAGNOSIS — R41841 Cognitive communication deficit: Secondary | ICD-10-CM

## 2021-08-29 DIAGNOSIS — R278 Other lack of coordination: Secondary | ICD-10-CM

## 2021-08-29 DIAGNOSIS — R29818 Other symptoms and signs involving the nervous system: Secondary | ICD-10-CM

## 2021-08-29 DIAGNOSIS — R2689 Other abnormalities of gait and mobility: Secondary | ICD-10-CM

## 2021-08-29 DIAGNOSIS — M6281 Muscle weakness (generalized): Secondary | ICD-10-CM

## 2021-08-29 DIAGNOSIS — R2681 Unsteadiness on feet: Secondary | ICD-10-CM

## 2021-08-29 DIAGNOSIS — R262 Difficulty in walking, not elsewhere classified: Secondary | ICD-10-CM

## 2021-08-29 NOTE — Therapy (Signed)
[facility]. [place], [place], [address] Phone: [phone]   Fax:  [phone]  Physical Therapy Treatment  Patient Details  Name: AKEEN BEH MRN: [id number] Date of Birth: Jan 03, 1941 Referring Provider (PT): Earnest Conroy Date: 08/29/2021   PT End of Session - 08/29/21 1526     Visit Number 2    Date for PT Re-Evaluation 11/14/21    Authorization Type [employer] medicare    PT Start Time [id number]    PT Stop Time 1527    PT Time Calculation (min) 44 min    Activity Tolerance Patient tolerated treatment well    Behavior During Therapy [facility] for tasks assessed/performed             Past Medical History:  Diagnosis Date   Dementia ([place])    Hearing loss    Parkinson's disease ([place])     Past Surgical History:  Procedure Laterality Date   no surgical history per patient     None      There were no vitals filed for this visit.   Subjective Assessment - 08/29/21 1450     Subjective no falls.  Feeling pretty good    Currently in Pain? No/denies                               [facility] Adult PT Treatment/Exercise - 08/29/21 0001       Transfers   Comments worked a lot on standing from sitting, walking 3 steps and then turning and sitting in another chair, had him count out loud, had him say big as he did this, still reall gets stuck      Ambulation/Gait   Gait Comments gait outside up/dwn slope, negotiating curbs, walk in the grass, safety education on this, no device, occasional shuffle      Exercises   Exercises Knee/Hip      Knee/Hip Exercises: Aerobic   Recumbent Bike level 4 x6 minutes    Nustep level 5 x 6 minutes      Knee/Hip Exercises: Seated   Other Seated Knee/Hip Exercises Seated PWR moves                       PT Short Term Goals - 08/29/21 1529       PT SHORT TERM GOAL #1   Title decrease TUG time to 23 seconds    Status On-going                PT Long Term Goals - 08/15/21 1432       PT LONG TERM GOAL #1   Title I with advanced HEP to include balance and flexibility    Time 12    Period Weeks    Status New      PT LONG TERM GOAL #2   Title decrease TUG to 14 seconds    Time 12    Period Weeks    Status New      PT LONG TERM GOAL #3   Title increase Berg balance score to 49/56    Time 12    Period Weeks    Status New      PT LONG TERM GOAL #4   Title demonstrate a transfer from one sitting surface to another 10 feet away with no shuffling pattern noted    Time 12    Period Weeks  Status New      PT LONG TERM GOAL #5   Title pt will reports no loss of control of gait speed when ambulating down inclines during his walking    Time 12    Period Weeks    Status New                   Plan - 08/29/21 1527     Clinical Impression Statement Did more walking outside, grass, slopes curbs etc.. At times he does shuffle but for the most part  moves well in straight lines, the issue comes when he turns to sit, I had him do repeated transfers and had him count out loud, I did some metronome type snapping to helpw ith the cadence, repeatedly said big, big steps, he still really has issues with this, the obstacle course he did okay with, some increased shuffling but not as bad as the transfers, I did some PWR moves today in sitting    PT Next Visit Plan continue with PWR moves and work on transfers    [employer] and Agree with Plan of Care Patient             Patient will benefit from skilled therapeutic intervention in order to improve the following deficits and impairments:  Decreased range of motion, Difficulty walking, Impaired flexibility, Decreased balance, Abnormal gait, Decreased coordination, Pain, Decreased mobility, Decreased strength  Visit Diagnosis: Other lack of coordination  Muscle weakness (generalized)  Other abnormalities of gait and mobility  Repeated falls  Unsteady  gait  Difficulty in walking, not elsewhere classified     Problem List Patient Active Problem List   Diagnosis Date Noted   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 [place], [place], [address] Phone: [phone]   Fax:  [phone]  Name: KHILAN GILLISON MRN: [id number] Date of Birth: December 16, 1940

## 2021-08-29 NOTE — Therapy (Addendum)
[place]. [place], [place], [address] Phone: [phone]   Fax:  [phone]  Speech Language Pathology Treatment  Patient Details  Name: Kyle Mcguire MRN: [id number] Date of Birth: 11-17-1941 Referring Provider (SLP): Rickard Patience MD   Encounter Date: 08/29/2021   End of Session - 08/29/21 1624     Visit Number 3    Number of Visits 9    Date for SLP Re-Evaluation 10/10/21    SLP Start Time [id number]    SLP Stop Time  1620    SLP Time Calculation (min) 50 min    Activity Tolerance Patient tolerated treatment well             Past Medical History:  Diagnosis Date   Dementia ([place])    Hearing loss    Parkinson's disease ([place])     Past Surgical History:  Procedure Laterality Date   no surgical history per patient     None      There were no vitals filed for this visit.   Subjective Assessment - 08/29/21 1623     Subjective Pt wife came with pt to session today.    Patient is accompained by: Family member    Currently in Pain? No/denies                   ADULT SLP TREATMENT - 08/29/21 1647       General Information   Behavior/Cognition Cooperative;Alert      Treatment Provided   Treatment provided Cognitive-Linquistic      Cognitive-Linquistic Treatment   Treatment focused on Patient/family/caregiver education    Skilled Treatment SLP edu pt and wife on Parkinson's Disease and provided examples of symptoms regarding dysphagia, cognition, speech, and language. Pt and wife were receptive to information. Wife and pt expressed their concerns with pt's swallowing. Wife reports that pt "does not chew his food and takes too many bites at once." Endorsed difficulty swallowing solids, but no difficulty with liquids. SLP to conduct CSE next visit. SLP provided behavioral strategies to implement for swallowing (ex: placing fork down in between each bite). Wife also expressed her  concern with pt's memory (medication management) and "mumbling". SLP encouraged wife to use external strategies (i.e. memory journal, calendar) to assist with pt recalling events of his day. Wife in agreement and reportedly will attend sessions moving forward.      Assessment / Recommendations / Plan   Plan Continue with current plan of care      Progression Toward Goals   Progression toward goals Progressing toward goals              SLP Education - 08/29/21 1623     Education Details PD and swallowing, speech, and cognitive-communication changes    Person(s) Educated Patient;Spouse    Methods Explanation;Demonstration;Handout    Comprehension Verbalized understanding;Need further instruction              SLP Short Term Goals - 08/21/21 1537       SLP SHORT TERM GOAL #1   Title Pt/caregiver will demonstrate orientation x4 with modA verbal cues to refer to compensatory aids (i.e. calendar).    Time 4    Period Weeks    Status On-going    Target Date 09/12/21      SLP SHORT TERM GOAL #2   Title Pt/caregiver will recall 3 memory strategies that can assist with recall of important information.    Time  4    Period Weeks    Status On-going    Target Date 09/12/21      SLP SHORT TERM GOAL #3   Title Pt/caregiver will participate in educational session regarding resources for those with Parkinson's Disease.    Time 4    Period Weeks    Status On-going    Target Date 09/12/21              SLP Long Term Goals - 08/21/21 1539       SLP LONG TERM GOAL #1   Title Patient will demonstrate orientation x4 with sup A verbal cues to refer to compensatory aids (i.e. calendar).    Time 8    Period Weeks    Status On-going      SLP LONG TERM GOAL #2   Title Pt/caregiver will report successful use of memory aids to recall important information.    Time 8    Period Weeks    Status On-going      SLP LONG TERM GOAL #3   Title Pt/caregiver will list 2-3 resources that  are available for persons with Parkinson's Disease.    Time 8    Period Weeks    Status On-going              Plan - 08/29/21 1657     Clinical Impression Statement See tx note. Pt/caregiver edu was provided during today's session to pt and wife on symptoms regarding Parkinson's Disease. Pt and wife were responsive to edu. Cont with current POC.    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 Communication Sciences and Disorders  08/29/2021, 5:05 PM  [place]. [place], [place], [address] Phone: [phone]   Fax:  [phone]   Name: Kyle Mcguire MRN: [id number] Date of Birth: 12-21-1940

## 2021-08-30 NOTE — Therapy (Signed)
[place]. [place], [place], [address] Phone: [phone]   Fax:  [phone]  Occupational Therapy Treatment  Patient Details  Name: Kyle Mcguire MRN: [id number] Date of Birth: 1941/10/04 Referring Provider (OT): Leander Rams, MD  Encounter Date: 08/29/2021   OT End of Session - 08/29/21 1405     Visit Number 2    Number of Visits 5    Date for OT Re-Evaluation 10/14/21    Authorization Type [employer] Medicare    Authorization Time Period VL: [place]    Progress Note Due on Visit 10    OT Start Time 1402    OT Stop Time 1444    OT Time Calculation (min) 42 min    Activity Tolerance Patient tolerated treatment well    Behavior During Therapy [facility] for tasks assessed/performed            Past Medical History:  Diagnosis Date   Dementia ([place])    Hearing loss    Parkinson's disease ([place])    Past Surgical History:  Procedure Laterality Date   no surgical history per patient     None     There were no vitals filed for this visit.   Subjective Assessment - 08/29/21 1404     Subjective  "I feel more coordinated with my left hand than my right"    Pertinent History Parkinson's disease (dx 3 years ago, per chart review) w/ associated dementia; shuffling gait    Patient Stated Goals Balance, handwriting    Currently in Pain? No/denies             Treatment/Exercises - 08/29/21    Coordination Activities Pt completed coordination activities w/ each UE focusing on big movements, including rotating small ball w/ fingertips, tossing ball between UEs, tossing/catching ball w/ ipsilateral UE, flipping cards off a deck, rotating cards in-hand, pushing cards off deck using thumb, flicking cards off table, picking up coins 1 at a time and translating palm to fingertips to place in a stack. Pt dropped approx 80% of tosses w/ ball activities w/ increased success using left hand vs. right hand;  min difficulty w/ remaining activities    Handwriting Copying simple sentence on oversized lined paper using standard pen, weighted pen, and pen w/ built-up handle to assess/develop handwriting compensatory strategies. Pt demo'd micrographia and difficulty w/ working memory, requiring min visual cues; increased legibility w/ both weighted and built-up utensil         OT Education - 08/29/21 1405     Education Details Coordination HEP    Person(s) Educated Patient    Methods Explanation;Demonstration;Handout;Tactile cues;Verbal cues    Comprehension Verbalized understanding;Returned demonstration;Verbal cues required;Need further instruction             OT Short Term Goals - 08/29/21 1406       OT SHORT TERM GOAL #1   Title Pt will demo independence w/ HEP designed for coordination to improve participation in functional FM tasks (e.g., clothing manipulatives, handwriting)    Baseline No HEP at this time    Time 2    Period Weeks    Status On-going    Target Date 08/30/21      OT SHORT TERM GOAL #2   Title Pt will independently incorporate compensatory strategies, including AE prn, during handwriting tasks    Baseline Decreased knowledge of compensatory strategies/AE    Time 2    Period Weeks    Status  On-going             OT Long Term Goals - 08/29/21 1406       OT LONG TERM GOAL #1   Title Pt will demonstrate improved working memory and Secretary/administrator as evidenced by completing Trail Making Test: Part A w/ no errors    Baseline TMT: Part A in 52 sec w/ 2 errors    Time 4    Period Weeks    Status On-going      OT LONG TERM GOAL #2   Title Pt will be able to write 1 sentence w/ at least 50% legibility, using compensatory strategies and AE prn    Baseline 25% legibility w/ mild micrographia    Time 4    Period Weeks    Status On-going             Plan - 08/29/21 1445     Clinical Impression Statement OT introduced coordination activities that pt is  able to complete at home, emphasizing big, strong, deliberate movements. Pt required modeling, verbal cues, and repetition to facilitate understanding and sequencing; also demonstrated difficulty switching between tasks, requiring occasional redirection and modeling, due to reverting to prior task sequencing. Pt exhibited increased contol and coordination (GM and FM) w/ LUE compared w/ RUE.    OT Occupational Profile and History Detailed Assessment- Review of Records and additional review of physical, cognitive, psychosocial history related to current functional performance    Occupational performance deficits (Please refer to evaluation for details): IADL's;Leisure;Social Participation;ADL's    Body Structure / Function / Physical Skills Strength;Balance;Dexterity;UE functional use;IADL;Coordination;Mobility;FMC;ADL    Cognitive Skills Memory;Problem Solve;Safety Awareness    Psychosocial Skills Environmental  Adaptations    Rehab Potential Good    Clinical Decision Making Several treatment options, min-mod task modification necessary    Comorbidities Affecting Occupational Performance: May have comorbidities impacting occupational performance    Modification or Assistance to Complete Evaluation  Min-Moderate modification of tasks or assist with assess necessary to complete eval    OT Frequency 1x / week    OT Duration 4 weeks    OT Treatment/Interventions Self-care/ADL training;DME and/or AE instruction;Balance training;Therapeutic activities;Therapeutic exercise;Cognitive remediation/compensation;Functional Mobility Training;Patient/family education;Energy conservation    Plan Review coordination HEP; continue to address handwriting strategies    Recommended Other Services Pt is currently receiving PT/ST services at this clinic    Consulted and Agree with Plan of Care Patient            Patient will benefit from skilled therapeutic intervention in order to improve the following deficits and  impairments:   Body Structure / Function / Physical Skills: Strength, Balance, Dexterity, UE functional use, IADL, Coordination, Mobility, FMC, ADL Cognitive Skills: Memory, Problem Solve, Safety Awareness Psychosocial Skills: Environmental  Adaptations  Visit Diagnosis: Other lack of coordination  Other symptoms and signs involving the nervous system  Muscle weakness (generalized)  Problem List Patient Active Problem List   Diagnosis Date Noted   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 [place], [place], [address] Phone: [phone]   Fax:  [phone]  Name: Kyle Mcguire MRN: [id number] Date of Birth: 1941/07/18

## 2021-09-04 ENCOUNTER — Encounter: Payer: Self-pay | Admitting: Physical Therapy

## 2021-09-04 ENCOUNTER — Other Ambulatory Visit: Payer: Self-pay

## 2021-09-04 ENCOUNTER — Ambulatory Visit: Payer: Medicare Other | Admitting: Speech Pathology

## 2021-09-04 ENCOUNTER — Telehealth: Payer: Self-pay | Admitting: Speech Pathology

## 2021-09-04 ENCOUNTER — Ambulatory Visit: Payer: Medicare Other | Admitting: Occupational Therapy

## 2021-09-04 ENCOUNTER — Encounter: Payer: Self-pay | Admitting: Occupational Therapy

## 2021-09-04 ENCOUNTER — Ambulatory Visit: Payer: Medicare Other | Admitting: Physical Therapy

## 2021-09-04 DIAGNOSIS — R278 Other lack of coordination: Secondary | ICD-10-CM

## 2021-09-04 DIAGNOSIS — M6281 Muscle weakness (generalized): Secondary | ICD-10-CM

## 2021-09-04 DIAGNOSIS — R262 Difficulty in walking, not elsewhere classified: Secondary | ICD-10-CM

## 2021-09-04 DIAGNOSIS — R2681 Unsteadiness on feet: Secondary | ICD-10-CM

## 2021-09-04 DIAGNOSIS — R296 Repeated falls: Secondary | ICD-10-CM

## 2021-09-04 DIAGNOSIS — R2689 Other abnormalities of gait and mobility: Secondary | ICD-10-CM

## 2021-09-04 DIAGNOSIS — R29818 Other symptoms and signs involving the nervous system: Secondary | ICD-10-CM

## 2021-09-04 DIAGNOSIS — R41841 Cognitive communication deficit: Secondary | ICD-10-CM

## 2021-09-04 NOTE — Therapy (Signed)
[place]. [place], [place], [address] Phone: [phone]   Fax:  [phone]  Physical Therapy Treatment  Patient Details  Name: Kyle Mcguire MRN: [id number] Date of Birth: 04-29-1941 Referring Provider (PT): Earnest Conroy Date: 09/04/2021   PT End of Session - 09/04/21 1521     Visit Number 3    Number of Visits 12    Date for PT Re-Evaluation 11/14/21    Authorization Type U[employer] medicare    PT Start Time 3557    PT Stop Time 1527    PT Time Calculation (min) 44 min    Activity Tolerance Patient tolerated treatment well    Behavior During Therapy [facility] for tasks assessed/performed             Past Medical History:  Diagnosis Date   Dementia ([place])    Hearing loss    Parkinson's disease ([place])     Past Surgical History:  Procedure Laterality Date   no surgical history per patient     None      There were no vitals filed for this visit.   Subjective Assessment - 09/04/21 1449     Subjective no falls.  Feeling pretty good, no pain                [facility] PT Assessment - 09/04/21 0001       Timed Up and Go Test   Normal TUG (seconds) 25                           [facility] Adult PT Treatment/Exercise - 09/04/21 0001       High Level Balance   High Level Balance Activities Side stepping;Backward walking;Direction changes    High Level Balance Comments 6" toe touches, cone toe touches, cone pick ups, walking ball toss ball kicks ball kicks      Knee/Hip Exercises: Aerobic   Tread Mill up to 1.0 MPH a lot of cues for step length and to relax, when he tenses up he takes the small shuffling steps    Nustep level 5 x 6 minutes    Other Aerobic UBE x 4 minutes change direction every minute                       PT Short Term Goals - 09/04/21 1525       PT SHORT TERM GOAL #1   Title decrease TUG time to 23 seconds    Status Partially Met                PT Long Term Goals - 08/15/21 1432       PT LONG TERM GOAL #1   Title I with advanced HEP to include balance and flexibility    Time 12    Period Weeks    Status New      PT LONG TERM GOAL #2   Title decrease TUG to 14 seconds    Time 12    Period Weeks    Status New      PT LONG TERM GOAL #3   Title increase Berg balance score to 49/56    Time 12    Period Weeks    Status New      PT LONG TERM GOAL #4   Title demonstrate a transfer from one sitting surface to another 10 feet away with no shuffling pattern  noted    Time 12    Period Weeks    Status New      PT LONG TERM GOAL #5   Title pt will reports no loss of control of gait speed when ambulating down inclines during his walking    Time 12    Period Weeks    Status New                   Plan - 09/04/21 1522     Clinical Impression Statement Patient really did well today, much less shuffling steps, he did transfers really well where he normally will have the stutter stps he was able to complete the trasnfer with only minimal hesitation.  Once he got fatigued he did have some increased difficulty and had a near fall with stepping off the tmill, required mod A to correct    PT Next Visit Plan continue with PWR moves and work on transfers    [employer] and Agree with Plan of Care Patient             Patient will benefit from skilled therapeutic intervention in order to improve the following deficits and impairments:  Decreased range of motion, Difficulty walking, Impaired flexibility, Decreased balance, Abnormal gait, Decreased coordination, Pain, Decreased mobility, Decreased strength  Visit Diagnosis: Other lack of coordination  Muscle weakness (generalized)  Other symptoms and signs involving the nervous system  Other abnormalities of gait and mobility  Repeated falls  Unsteady gait  Difficulty in walking, not elsewhere classified     Problem List Patient Active Problem List    Diagnosis Date Noted   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 [place], [place], [address] Phone: [phone]   Fax:  [phone]  Name: Kyle Mcguire MRN: [id number] Date of Birth: 07/28/1941

## 2021-09-04 NOTE — Telephone Encounter (Signed)
Called primary care to request MBS order.   [address] [place], [place], CBIS

## 2021-09-04 NOTE — Therapy (Signed)
[place]. [place], [place], [address] Phone: [phone]   Fax:  [phone]  Occupational Therapy Treatment  Patient Details  Name: Kyle Mcguire MRN: [id number] Date of Birth: 15-Aug-1941 Referring Provider (OT): Leander Rams, MD   Encounter Date: 09/04/2021   OT End of Session - 09/04/21 1359     Visit Number 3    Number of Visits 5    Date for OT Re-Evaluation 10/14/21    Authorization Type [employer] Medicare    Authorization Time Period VL: [place]    Progress Note Due on Visit 10    OT Start Time 1358    OT Stop Time 1440    OT Time Calculation (min) 42 min    Activity Tolerance Patient tolerated treatment well    Behavior During Therapy [facility] for tasks assessed/performed            Past Medical History:  Diagnosis Date   Dementia ([place])    Hearing loss    Parkinson's disease ([place])     Past Surgical History:  Procedure Laterality Date   no surgical history per patient     None      There were no vitals filed for this visit.   Subjective Assessment - 09/04/21 1358     Subjective  Pt replied "not really" when asked if he could see the letters during handwriting activity    Pertinent History Parkinson's disease (dx 3 years ago, per chart review) w/ associated dementia; shuffling gait    Patient Stated Goals Balance, handwriting    Currently in Pain? No/denies             Treatment/Exercises - 09/04/21    Coordination Activities Completed coordination activities w/ each UE, including rotating small ball w/ fingertips, tossing ball between UEs, tossing/catching ball w/ ipsilateral UE, flipping cards off a deck, and pushing cards off deck using thumb    Large Amplitude Movements Closed-chain BUE AAROM shoulder flexion holding ball, chest press w/ 2# dowel rod, and trunk rotation holding 2# dowel w/ BUEs extended; completed 2 sets of each exercise. OT provided modeling and  verbal cues. To grade rotation exercise down, OT attempted to facilitate contralateral reaching w/ trunk rotation; pt demonstrated decreased rotation to R side compared to L    Handwriting Simple tabletop crossword puzzle used to facilitate alignment and sizing of letter formation during handwriting while incorporating cognitive element. OT provided breakdown of task, modeling, verbal cues, and visual cues to facilitate success. Activity was d/c due to difficulty w/ pt completing modified handwriting activity on oversized lined paper. Pt able to form familiar letters (e.g. name), experiencing increased difficulty w/ alignment to line; success also improved w/ high contrast line guide/visual aid    Visual Perception Simple visual perception activities completed to assess visual scanning and pattern recognition. Pt completed simple symbol cancellation w/ 100% accuracy and disorganized search pattern. Higher level double number cancellation completed 18/19 w/ appropriate scanning pattern. Unable to recognize simple pattern during small pegboard activity, requiring breakdown of task, visual cues, and downgrading for success.         OT Education - 09/04/21 1359     Education Details Reviewed coordination HEP    Person(s) Educated Patient    Methods Explanation;Demonstration;Tactile cues;Verbal cues    Comprehension Verbalized understanding;Returned demonstration;Verbal cues required             OT Short Term Goals - 08/29/21 1406  OT SHORT TERM GOAL #1   Title Pt will demo independence w/ HEP designed for coordination to improve participation in functional FM tasks (e.g., clothing manipulatives, handwriting)    Baseline No HEP at this time    Time 2    Period Weeks    Status On-going    Target Date 08/30/21      OT SHORT TERM GOAL #2   Title Pt will independently incorporate compensatory strategies, including AE prn, during handwriting tasks    Baseline Decreased knowledge of  compensatory strategies/AE    Time 2    Period Weeks    Status On-going             OT Long Term Goals - 08/29/21 1406       OT LONG TERM GOAL #1   Title Pt will demonstrate improved working memory and Secretary/administrator as evidenced by completing Trail Making Test: Part A w/ no errors    Baseline TMT: Part A in 52 sec w/ 2 errors    Time 4    Period Weeks    Status On-going      OT LONG TERM GOAL #2   Title Pt will be able to write 1 sentence w/ at least 50% legibility, using compensatory strategies and AE prn    Baseline 25% legibility w/ mild micrographia    Time 4    Period Weeks    Status On-going             Plan - 09/04/21 1409     Clinical Impression Statement To facilitate improved carryover to home, OT reviewed coordination activities previously administered in coordination-focused HEP. Pt was able to complete exercises w/ less cueing, transition between tasks, and complete activities w/ less drops, indicating fair carryover of previously learned strategies from prior session. OT also introduced simple gross motor sequencing to facilitate large amplitude movement w/ pt demonstrating potential visual inattention/neglect toward R side; to be assessed further. Pt also exhibits mild visual perceptual deficits, disorganized scanning pattern, and difficulty w/ pattern recognition.    OT Occupational Profile and History Detailed Assessment- Review of Records and additional review of physical, cognitive, psychosocial history related to current functional performance    Occupational performance deficits (Please refer to evaluation for details): IADL's;Leisure;Social Participation;ADL's    Body Structure / Function / Physical Skills Strength;Balance;Dexterity;UE functional use;IADL;Coordination;Mobility;FMC;ADL    Cognitive Skills Memory;Problem Solve;Safety Awareness    Psychosocial Skills Environmental  Adaptations    Rehab Potential Good    Clinical Decision Making Several  treatment options, min-mod task modification necessary    Comorbidities Affecting Occupational Performance: May have comorbidities impacting occupational performance    Modification or Assistance to Complete Evaluation  Min-Moderate modification of tasks or assist with assess necessary to complete eval    OT Frequency 1x / week    OT Duration 4 weeks    OT Treatment/Interventions Self-care/ADL training;DME and/or AE instruction;Balance training;Therapeutic activities;Therapeutic exercise;Cognitive remediation/compensation;Functional Mobility Training;Patient/family education;Energy conservation    Plan Continue to address handwriting strategies; large amplitude UE movements while seated (e.g., reaching for cones)    Recommended Other Services Pt is currently receiving PT/ST services at this clinic    Consulted and Agree with Plan of Care Patient            Patient will benefit from skilled therapeutic intervention in order to improve the following deficits and impairments:   Body Structure / Function / Physical Skills: Strength, Balance, Dexterity, UE functional use, IADL, Coordination, Mobility, FMC,  ADL Cognitive Skills: Memory, Problem Solve, Safety Awareness Psychosocial Skills: Environmental  Adaptations   Visit Diagnosis: Other lack of coordination  Muscle weakness (generalized)  Other symptoms and signs involving the nervous system  Other abnormalities of gait and mobility   Problem List Patient Active Problem List   Diagnosis Date Noted   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 [place], [place], [address] Phone: [phone]   Fax:  [phone]  Name: Kyle Mcguire MRN: [id number] Date of Birth: [birth year]

## 2021-09-04 NOTE — Therapy (Signed)
[place]. [place], [place], [address] Phone: [phone]   Fax:  [phone]  Speech Language Pathology Treatment  Patient Details  Name: Kyle Mcguire MRN: [id number] Date of Birth: [birth year] Referring Provider (SLP): Rickard Patience MD   Encounter Date: 09/04/2021   End of Session - 09/04/21 1538     Visit Number 4    Number of Visits 9    Date for SLP Re-Evaluation 10/10/21    SLP Start Time 1315    SLP Stop Time  3536    SLP Time Calculation (min) 40 min    Activity Tolerance Patient tolerated treatment well             Past Medical History:  Diagnosis Date   Dementia ([place])    Hearing loss    Parkinson's disease ([place])     Past Surgical History:  Procedure Laterality Date   no surgical history per patient     None      There were no vitals filed for this visit.   Subjective Assessment - 09/04/21 1536     Subjective Wife did not come with patient for therapy today.    Currently in Pain? No/denies                   ADULT SLP TREATMENT - 09/04/21 0001       General Information   Behavior/Cognition Alert;Cooperative      Treatment Provided   Treatment provided Cognitive-Linquistic      Cognitive-Linquistic Treatment   Treatment focused on Cognition    Skilled Treatment Pt participated in a Blink! card game targeting memory and attention. SLP utilized "repetition" strategy for easier recall of rules. Pt required maxA in order to match cards by color, shape, and number. Pt benefited from multiple choice cues in order to match the right card. When asked, pt was able to recall the three rules of the game at the end of the session. During the session, pt was observed taking consecutive sips of water and coughed. Wife reported last session that coughing occurs frequently when drinking or eating. SLP to request MBS to r/o aspiration.      Assessment / Recommendations /  Plan   Plan Continue with current plan of care      Progression Toward Goals   Progression toward goals Progressing toward goals                SLP Short Term Goals - 08/21/21 1537       SLP SHORT TERM GOAL #1   Title Pt/caregiver will demonstrate orientation x4 with modA verbal cues to refer to compensatory aids (i.e. calendar).    Time 4    Period Weeks    Status On-going    Target Date 09/12/21      SLP SHORT TERM GOAL #2   Title Pt/caregiver will recall 3 memory strategies that can assist with recall of important information.    Time 4    Period Weeks    Status On-going    Target Date 09/12/21      SLP SHORT TERM GOAL #3   Title Pt/caregiver will participate in educational session regarding resources for those with Parkinson's Disease.    Time 4    Period Weeks    Status On-going    Target Date 09/12/21              SLP Long Term Goals - 08/21/21 1539  SLP LONG TERM GOAL #1   Title Patient will demonstrate orientation x4 with sup A verbal cues to refer to compensatory aids (i.e. calendar).    Time 8    Period Weeks    Status On-going      SLP LONG TERM GOAL #2   Title Pt/caregiver will report successful use of memory aids to recall important information.    Time 8    Period Weeks    Status On-going      SLP LONG TERM GOAL #3   Title Pt/caregiver will list 2-3 resources that are available for persons with Parkinson's Disease.    Time 8    Period Weeks    Status On-going              Plan - 09/04/21 1458     Clinical Impression Statement See tx note. Pt demonstrates difficulty with visual attention/processing which impacts iADLs. This session, pt was observed drinking water w/ overt s/sx of aspiration following. SLP recommends an MBS to rule out aspiration due to wife also expressing concerns at home. Cont with current POC.    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 [place], [place], [address] Phone: [phone]   Fax:  [phone]   Name: CHAZ MCGLASSON MRN: [id number] Date of Birth: [birth year]

## 2021-09-10 ENCOUNTER — Telehealth (HOSPITAL_COMMUNITY): Payer: Self-pay | Admitting: *Deleted

## 2021-09-10 NOTE — Telephone Encounter (Signed)
Attempted to contact patient to schedule OP MBS. Left VM. RKEEL 

## 2021-09-11 ENCOUNTER — Ambulatory Visit: Payer: Medicare Other | Admitting: Occupational Therapy

## 2021-09-11 ENCOUNTER — Other Ambulatory Visit: Payer: Self-pay

## 2021-09-11 ENCOUNTER — Encounter: Payer: Self-pay | Admitting: Speech Pathology

## 2021-09-11 ENCOUNTER — Encounter: Payer: Self-pay | Admitting: Occupational Therapy

## 2021-09-11 ENCOUNTER — Ambulatory Visit: Payer: Medicare Other | Admitting: Speech Pathology

## 2021-09-11 ENCOUNTER — Encounter: Payer: Self-pay | Admitting: Physical Therapy

## 2021-09-11 ENCOUNTER — Ambulatory Visit: Payer: Medicare Other | Admitting: Physical Therapy

## 2021-09-11 DIAGNOSIS — R29818 Other symptoms and signs involving the nervous system: Secondary | ICD-10-CM

## 2021-09-11 DIAGNOSIS — R296 Repeated falls: Secondary | ICD-10-CM

## 2021-09-11 DIAGNOSIS — R278 Other lack of coordination: Secondary | ICD-10-CM

## 2021-09-11 DIAGNOSIS — M6281 Muscle weakness (generalized): Secondary | ICD-10-CM

## 2021-09-11 DIAGNOSIS — R2689 Other abnormalities of gait and mobility: Secondary | ICD-10-CM

## 2021-09-11 DIAGNOSIS — R41841 Cognitive communication deficit: Secondary | ICD-10-CM

## 2021-09-11 DIAGNOSIS — R2681 Unsteadiness on feet: Secondary | ICD-10-CM

## 2021-09-11 NOTE — Therapy (Signed)
[place]. [place], [place], [address] Phone: [phone]   Fax:  [phone]  Physical Therapy Treatment  Patient Details  Name: Kyle Mcguire MRN: [id number] Date of Birth: October 06, 1941 Referring Provider (PT): Earnest Conroy Date: 09/11/2021   PT End of Session - 09/11/21 1524     Visit Number 4    Number of Visits 12    Date for PT Re-Evaluation 11/14/21    Authorization Type [employer] medicare    PT Start Time 8099    PT Stop Time 1525    PT Time Calculation (min) 43 min    Activity Tolerance Patient tolerated treatment well    Behavior During Therapy [facility] for tasks assessed/performed             Past Medical History:  Diagnosis Date   Dementia ([place])    Hearing loss    Parkinson's disease ([place])     Past Surgical History:  Procedure Laterality Date   no surgical history per patient     None      There were no vitals filed for this visit.   Subjective Assessment - 09/11/21 1506     Subjective denies falls, does tell me that he got up and walked at 5AM this morning, I questioned his safety with this since it is dark at that time    Currently in Pain? No/denies                               [facility] Adult PT Treatment/Exercise - 09/11/21 0001       Ambulation/Gait   Gait Comments outside no deivce, cues for posture and step length, and talked with him about safety      High Level Balance   High Level Balance Activities Side stepping;Backward walking;Direction changes    High Level Balance Comments ball toss and catch on solid surface and on dynamic surface, on airex eyes closed      Knee/Hip Exercises: Stretches   Passive Hamstring Stretch Both;3 reps;20 seconds    Gastroc Stretch Both;2 reps;30 seconds      Knee/Hip Exercises: Machines for Strengthening   Cybex Knee Extension 5# 2x10 cues for end range of motion    Cybex Knee Flexion 25lb 2x10                         PT Short Term Goals - 09/04/21 1525       PT SHORT TERM GOAL #1   Title decrease TUG time to 23 seconds    Status Partially Met               PT Long Term Goals - 09/11/21 1529       PT LONG TERM GOAL #1   Title I with advanced HEP to include balance and flexibility    Status On-going      PT LONG TERM GOAL #2   Title decrease TUG to 14 seconds    Status On-going                   Plan - 09/11/21 1525     Clinical Impression Statement With cues patinet can do well with the bigger steps, he has a little more of the shuffling witht he transfers today.  He had difficulty on the dynamic surface.  I did worry about his safety if he  is getting up and walking at 5AM, the SLP wrote his wife a letter about this as she drops him off here and does not stay    PT Next Visit Plan continue with PWR moves and work on transfers    [employer] and Agree with Plan of Care Patient             Patient will benefit from skilled therapeutic intervention in order to improve the following deficits and impairments:  Decreased range of motion, Difficulty walking, Impaired flexibility, Decreased balance, Abnormal gait, Decreased coordination, Pain, Decreased mobility, Decreased strength  Visit Diagnosis: Other lack of coordination  Other symptoms and signs involving the nervous system  Muscle weakness (generalized)  Other abnormalities of gait and mobility  Repeated falls  Unsteady gait     Problem List Patient Active Problem List   Diagnosis Date Noted   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 [place], [place], [address] Phone: [phone]   Fax:   [phone]  Name: Kyle Mcguire MRN: [id number] Date of Birth: 1941/03/31

## 2021-09-11 NOTE — Therapy (Signed)
[facility]. [place], [place], [address] Phone: [phone]   Fax:  [phone]  Speech Language Pathology Treatment  Patient Details  Name: Kyle Mcguire MRN: [id number] Date of Birth: 08-03-1941 Referring Provider (SLP): Rickard Patience MD   Encounter Date: 09/11/2021   End of Session - 09/11/21 1320     Visit Number 5    Number of Visits 9    Date for SLP Re-Evaluation 10/10/21    SLP Start Time 9758    SLP Stop Time  1400    SLP Time Calculation (min) 45 min    Activity Tolerance Patient tolerated treatment well             Past Medical History:  Diagnosis Date   Dementia ([place])    Hearing loss    Parkinson's disease ([place])     Past Surgical History:  Procedure Laterality Date   no surgical history per patient     None      There were no vitals filed for this visit.   Subjective Assessment - 09/11/21 1317     Subjective Pt reported he went on a walk in the dark this morning. He was unable to share if his wife, Kyle Mcguire, was alerted prior to leaving the house.    Patient is accompained by: Family member    Currently in Pain? No/denies                   ADULT SLP TREATMENT - 09/11/21 1409       General Information   Behavior/Cognition Cooperative;Pleasant mood      Treatment Provided   Treatment provided Cognitive-Linquistic      Cognitive-Linquistic Treatment   Treatment focused on Cognition    Skilled Treatment SLP reviewed orientation questions with pt (date, birthday, day of week, month, and year). Pt required minA to provide accurate answers with visual. SLP noted pt benefited from visual aid (calendar) while answering orientation questions. Pt participated in a working memory task where pt was introduced to unfamiliar people and utilized a Biochemist, clinical (repetition) in order to recall information from each person. Pt required modA to recall information  using repetition (x3) strategy. SLP practiced emergency phone call scenarios with pt. SLP instructed pt to find his wife's contact information on his phone in order to reach her in an emergency scenario. Pt was able to do this with maxA. SLP noted that pt benefited from a visual support handout (task analysis) giving him steps to navigate his phone.      Assessment / Recommendations / Plan   Plan Continue with current plan of care      Progression Toward Goals   Progression toward goals Progressing toward goals                SLP Short Term Goals - 09/11/21 1320       SLP SHORT TERM GOAL #1   Title Pt/caregiver will demonstrate orientation to time x4 with modA verbal cues to refer to compensatory aids (i.e. calendar).    Time 4    Period Weeks    Status Achieved   SLP forgot to add "orientation to time". Pt has met goal for this, but is not able to recall age, DOB   Target Date 09/12/21      SLP [place] #2   Title Pt/caregiver will recall 3 memory strategies that can assist with recall of important information.  Time 4    Period Weeks    Status Partially Met   Wife has not been available.   Target Date 09/12/21      SLP SHORT TERM GOAL #3   Title Pt/caregiver will participate in educational session regarding resources for those with Parkinson's Disease.    Time 4    Period Weeks    Status Achieved    Target Date 09/12/21      SLP SHORT TERM GOAL #4   Title Given a visual task analysis support, pt will follow steps to make a phone call to his wife with minA across 3 sessions.    Time 4    Period Weeks    Status New    Target Date 10/10/21              SLP Long Term Goals - 08/21/21 1539       SLP LONG TERM GOAL #1   Title Patient will demonstrate orientation x4 with sup A verbal cues to refer to compensatory aids (i.e. calendar).    Time 8    Period Weeks    Status On-going      SLP LONG TERM GOAL #2   Title Pt/caregiver will report successful  use of memory aids to recall important information.    Time 8    Period Weeks    Status On-going      SLP LONG TERM GOAL #3   Title Pt/caregiver will list 2-3 resources that are available for persons with Parkinson's Disease.    Time 8    Period Weeks    Status On-going              Plan - 09/11/21 1449     Clinical Impression Statement See tx note. SLP left note with pt to give wife. Note consisted of request for her to join patient in ST to increase carry over of information, summary of today's tx, and reminder to call to schedule MBS. Cont with current POC.    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 Communication Sciences and Disorders  09/11/2021, 2:53 PM  [facility]. [place], [place], [address] Phone: [phone]   Fax:  [phone]   Name: Kyle Mcguire MRN: [id number] Date of Birth: 03-28-1941

## 2021-09-11 NOTE — Therapy (Signed)
[facility]. [place], [place], [address] Phone: [phone]   Fax:  [phone]  Occupational Therapy Treatment  Patient Details  Name: Kyle Mcguire MRN: [id number] Date of Birth: [birth year] Referring Provider (OT): Leander Rams, MD   Encounter Date: 09/11/2021   OT End of Session - 09/11/21 1406     Visit Number 4    Number of Visits 5    Date for OT Re-Evaluation 10/14/21    Authorization Type [employer]    Authorization Time Period VL: [place]    Progress Note Due on Visit 10    OT Start Time 1400    OT Stop Time 1445    OT Time Calculation (min) 45 min    Activity Tolerance Patient tolerated treatment well    Behavior During Therapy [facility] for tasks assessed/performed            Past Medical History:  Diagnosis Date   Dementia ([place])    Hearing loss    Parkinson's disease ([place])     Past Surgical History:  Procedure Laterality Date   no surgical history per patient     None      There were no vitals filed for this visit.   Subjective Assessment - 09/11/21 1406     Subjective  Pt reports he received a visual aid from the SLP to help him remember how to call his wife in case of emergencies    Pertinent History Parkinson's disease (dx 3 years ago, per chart review) w/ associated dementia; shuffling gait    Patient Stated Goals Balance, handwriting    Currently in Pain? No/denies             Treatment/Exercises - 09/11/21    Word Search  Tracing letters w/ word search activity using standard marker to facilitate coordination, letter formation, sizing, and alignment of letters during handwriting; pt completed activity w/ multiple deviations and demonstrated fair smoothness of lines. Activity also facilitated visual scanning, alternating attention, and eye-hand coordination/targeting. Pt able to identify 6/8 words w/ Mod I (increased time)    Large Amplitude Movements  Alternating crossbody reaching to retrieve therapy cones w/ contralateral UE to then reach to ipsilateral side to place on corresponding color mat. Also completed alternating large amplitude reaching to ipsilateral side to retrieve therapy cone from mat before reaching across midline to place on contralateral side. 2 sets of 10 reps of each exercise completed w/ each UE while sitting EOM to facilitate weight shifting and trunk rotation  Large amplitude reaching to retrieve therapy cones w/ ipsilateral UE before reaching outside BoS to place on cones positioned by OT to facilitate multidirectional reaching and continued core activation    Clothespins Activity Placing and removing resistance clothespins (1-8#) on inclined target, alternating between holding rope w/ one hand and placing clothespins w/ opposite hand; facilitated functional forward reach in sitting, increased [place], and intrinsic hand/pinch strength    IADL: Making a Phone Call Reviewed visual aid provided by OT in previous session sequencing through making a phone call to pt's wife in case of an emergency; pt able to complete activity w/ Min A, requiring extended time for processing and verbal cueing for success         OT Short Term Goals - 08/29/21 1406       OT SHORT TERM GOAL #1   Title Pt will demo independence w/ HEP designed for coordination to improve participation in functional FM  tasks (e.g., clothing manipulatives, handwriting)    Baseline No HEP at this time    Time 2    Period Weeks    Status On-going    Target Date 08/30/21      OT SHORT TERM GOAL #2   Title Pt will independently incorporate compensatory strategies, including AE prn, during handwriting tasks    Baseline Decreased knowledge of compensatory strategies/AE    Time 2    Period Weeks    Status On-going             OT Long Term Goals - 08/29/21 1406       OT LONG TERM GOAL #1   Title Pt will demonstrate improved working memory and Lexicographer as evidenced by completing Trail Making Test: Part A w/ no errors    Baseline TMT: Part A in 52 sec w/ 2 errors    Time 4    Period Weeks    Status On-going      OT LONG TERM GOAL #2   Title Pt will be able to write 1 sentence w/ at least 50% legibility, using compensatory strategies and AE prn    Baseline 25% legibility w/ mild micrographia    Time 4    Period Weeks    Status On-going             Plan - 09/11/21 1411     Clinical Impression Statement Due to difficulty w/ handwriting/letter formation observed in prior session, OT graded activity down this session to include tracing pre-written letters of word search w/ pt demonstrating good attention and visual scanning, identifying 6/8 w/ Mod I. Improved success w/ tracing letters suggests limitations may be due or partly due to aphasia. OT also continued to facilitate large amplitude movements, incorporating trunk flexion and rotation, w/ pt demonstrating increased core activation and movement patterns when able to visual identify clear targets compared w/ exercises completed focusing on AROM movements in prior session. Pt also exhibited increased understanding of sequencing of activities this session, requiring decreased cues for initial understanding.    OT Occupational Profile and History Detailed Assessment- Review of Records and additional review of physical, cognitive, psychosocial history related to current functional performance    Occupational performance deficits (Please refer to evaluation for details): IADL's;Leisure;Social Participation;ADL's    Body Structure / Function / Physical Skills Strength;Balance;Dexterity;UE functional use;IADL;Coordination;Mobility;FMC;ADL    Cognitive Skills Memory;Problem Solve;Safety Awareness    Psychosocial Skills Environmental  Adaptations    Rehab Potential Good    Clinical Decision Making Several treatment options, min-mod task modification necessary    Comorbidities Affecting  Occupational Performance: May have comorbidities impacting occupational performance    Modification or Assistance to Complete Evaluation  Min-Moderate modification of tasks or assist with assess necessary to complete eval    OT Frequency 1x / week    OT Duration 4 weeks    OT Treatment/Interventions Self-care/ADL training;DME and/or AE instruction;Balance training;Therapeutic activities;Therapeutic exercise;Cognitive remediation/compensation;Functional Mobility Training;Patient/family education;Energy conservation    Plan Discuss continuing w/ therapy for add'l month; continue to address handwriting strategies and facilitate large amplitude movements    Recommended Other Services Pt is currently receiving PT/ST services at this clinic    Consulted and Agree with Plan of Care Patient            Patient will benefit from skilled therapeutic intervention in order to improve the following deficits and impairments:   Body Structure / Function / Physical Skills: Strength, Balance, Dexterity, UE functional  use, IADL, Coordination, Mobility, [place], ADL Cognitive Skills: Memory, Problem Solve, Safety Awareness Psychosocial Skills: Environmental  Adaptations   Visit Diagnosis: Other lack of coordination  Other symptoms and signs involving the nervous system  Muscle weakness (generalized)  Other abnormalities of gait and mobility   Problem List Patient Active Problem List   Diagnosis Date Noted   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 [place], [place], [address] Phone: [phone]   Fax:  [phone]  Name: Kyle Mcguire MRN: [id number] Date of Birth: 1941/01/23

## 2021-09-12 ENCOUNTER — Other Ambulatory Visit (HOSPITAL_COMMUNITY): Payer: Self-pay

## 2021-09-12 DIAGNOSIS — R1310 Dysphagia, unspecified: Secondary | ICD-10-CM

## 2021-09-19 ENCOUNTER — Ambulatory Visit (HOSPITAL_COMMUNITY)
Admission: RE | Admit: 2021-09-19 | Discharge: 2021-09-19 | Disposition: A | Discharge status: Home / Self Care | Payer: Medicare Other | Source: Ambulatory Visit | Attending: Family Medicine | Admitting: Family Medicine

## 2021-09-19 ENCOUNTER — Other Ambulatory Visit: Payer: Self-pay

## 2021-09-19 DIAGNOSIS — R1310 Dysphagia, unspecified: Secondary | ICD-10-CM | POA: Insufficient documentation

## 2021-09-23 ENCOUNTER — Telehealth: Payer: Self-pay | Admitting: Family Medicine

## 2021-09-23 MED ORDER — CARBIDOPA-LEVODOPA 25-100 MG PO TABS: ORAL_TABLET | ORAL | 1 refills | Status: DC

## 2021-09-23 NOTE — Telephone Encounter (Signed)
Wife has called asking that it be noted they now use [facility] [phone]) and she is needing pt's carbidopa-levodopa (SINEMET IR) 25-100 MG tablet on auto refill

## 2021-09-23 NOTE — Telephone Encounter (Signed)
Updated pharmacy. E-scribed refills to pharmacy. Pt has next follow up 01/22/21.

## 2021-09-23 NOTE — Addendum Note (Signed)
Addended by: Wyvonnia Lora on: 09/23/2021 10:29 AM   Modules accepted: Orders

## 2021-10-09 ENCOUNTER — Ambulatory Visit: Payer: Medicare HMO | Admitting: Neurology

## 2021-11-05 ENCOUNTER — Emergency Department (HOSPITAL_BASED_OUTPATIENT_CLINIC_OR_DEPARTMENT_OTHER)
Admission: EM | Admit: 2021-11-05 | Discharge: 2021-11-05 | Disposition: A | Discharge status: Home / Self Care | Payer: Medicare Other | Attending: Emergency Medicine | Admitting: Emergency Medicine

## 2021-11-05 ENCOUNTER — Emergency Department (HOSPITAL_BASED_OUTPATIENT_CLINIC_OR_DEPARTMENT_OTHER): Payer: Medicare Other

## 2021-11-05 ENCOUNTER — Ambulatory Visit: Payer: Self-pay | Admitting: *Deleted

## 2021-11-05 ENCOUNTER — Other Ambulatory Visit: Payer: Self-pay

## 2021-11-05 ENCOUNTER — Encounter (HOSPITAL_BASED_OUTPATIENT_CLINIC_OR_DEPARTMENT_OTHER): Payer: Self-pay | Admitting: Emergency Medicine

## 2021-11-05 DIAGNOSIS — Z7982 Long term (current) use of aspirin: Secondary | ICD-10-CM | POA: Insufficient documentation

## 2021-11-05 DIAGNOSIS — G20 Parkinson's disease: Secondary | ICD-10-CM | POA: Insufficient documentation

## 2021-11-05 DIAGNOSIS — W19XXXA Unspecified fall, initial encounter: Secondary | ICD-10-CM

## 2021-11-05 DIAGNOSIS — F0390 Unspecified dementia without behavioral disturbance: Secondary | ICD-10-CM | POA: Diagnosis not present

## 2021-11-05 DIAGNOSIS — Z79899 Other long term (current) drug therapy: Secondary | ICD-10-CM | POA: Diagnosis not present

## 2021-11-05 DIAGNOSIS — W01198A Fall on same level from slipping, tripping and stumbling with subsequent striking against other object, initial encounter: Secondary | ICD-10-CM | POA: Diagnosis not present

## 2021-11-05 DIAGNOSIS — S0990XA Unspecified injury of head, initial encounter: Secondary | ICD-10-CM | POA: Insufficient documentation

## 2021-11-05 NOTE — ED Triage Notes (Signed)
Pt arrives to ED with c/o of fall. Pt reports he lost his balance, causing him to fall, hitting his head on a piece of metal that occurred this afternoon. He went to an UC today who sutured a laceration on his head from the fall but he was told to f/u in an ER for T scan of his head. He is not on blood thinners.

## 2021-11-05 NOTE — ED Provider Notes (Signed)
[facility] DEPT Provider Note   CSN: [id number] Arrival date & time: 11/05/21  1621     History No chief complaint on file.   Kyle Mcguire is a 80 y.o. male.  HPI  Patient with history of orthostatic hypotension, Parkinson's disease, hearing loss presents due to fall.  Patient was walking to the walking chair, he tripped and fell forward hitting his head against a metal art work on the wall.  He did not lose consciousness, denies any headache, nausea, vomiting, blurry vision.  He is up-to-date on his tetanus, initially went to urgent care to get sutures to his head.  This was performed, they advised to go to the ED to get CT head imaging.  He is not on any blood thinners.   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 Authorizing Provider  aspirin 81 MG chewable tablet Chew 81 mg by mouth daily.    [provider]  carbidopa-levodopa (SINEMET IR) 25-100 MG tablet TAKE 1 TABLET FOUR TIMES DAILY 09/23/21   Lomax, Amy, NP  donepezil (ARICEPT) 10 MG tablet Take 5 mg by mouth at bedtime.    [provider]  FIBER PO Take 1  tablet by mouth at bedtime. Patient not taking: Reported on 07/22/2021    [provider]  furosemide (LASIX) 20 MG tablet Take 20 mg by mouth.    [provider]  rivastigmine (EXELON) 1.5 MG capsule Take 1 capsule (1.5 mg total) by mouth 2 (two) times daily. Patient not taking: Reported on 07/22/2021 05/01/21   Debbora Presto, NP    Allergies    Patient has no known allergies.  Review of Systems   Review of Systems  Constitutional:  Negative for fever.  HENT:         Head trauma  Eyes:  Negative for visual disturbance.  Respiratory:  Negative for shortness of breath.   Cardiovascular:  Negative for chest pain.  Gastrointestinal:  Negative for abdominal pain, nausea and vomiting.  Skin:  Positive for wound.  Neurological:  Negative for syncope, light-headedness and headaches.   Physical Exam Updated Vital Signs There were no vitals taken for this visit.  Physical Exam Vitals and nursing note reviewed. Exam conducted with a chaperone present.  Constitutional:      Appearance: Normal appearance.  HENT:     Head: Normocephalic.     Comments: Sutures in place to the cranium.  No surrounding erythema, wound well approximated.    Right Ear: Tympanic membrane  normal.     Left Ear: Tympanic membrane normal.     Ears:     Comments: No auricular hematoma. No hemotympanum. Negative battle sign.     Nose: Nose normal.     Comments: No clear nasal drainage concerning for CSF leak. No septal hematoma. No nasal crepitus.     Mouth/Throat:     Mouth: Mucous membranes are moist.     Pharynx: Oropharynx is clear.     Comments: No malocclusion or dental fractures. No dental avulsions appreciated.  Eyes:     General: No scleral icterus.       Right eye: No discharge.        Left eye: No discharge.     Extraocular Movements: Extraocular movements intact.     Pupils: Pupils are equal, round, and reactive to light.     Comments: Negative for hyphemia, normal shaped pupil. No  restrictions of EOM or pain with EOMs.  Neck:     Comments: Good range of motion to the cervical spine, no midline tenderness, no palpable step-offs Cardiovascular:     Rate and Rhythm: Normal rate and regular rhythm.     Pulses: Normal pulses.     Heart sounds: Normal heart sounds. No murmur heard.   No friction rub. No gallop.  Pulmonary:     Effort: Pulmonary effort is normal. No respiratory distress.     Breath sounds: Normal breath sounds.  Abdominal:     General: Abdomen is flat. Bowel sounds are normal. There is no distension.     Palpations: Abdomen is soft.     Tenderness: There is no abdominal tenderness.  Musculoskeletal:     Cervical back: Normal range of motion. No tenderness.  Skin:    General: Skin is warm and dry.     Coloration: Skin is not jaundiced.  Neurological:     General: No focal deficit present.     Mental Status: He is alert and oriented to person, place, and time. Mental status is at baseline.     Coordination: Coordination normal.     Comments: Cranial nerves III through XII are grossly intact.  Grip strength is equal bilaterally, lower extremity strength is equal bilaterally  Psychiatric:        Mood and Affect: Mood normal.    ED Results / Procedures / Treatments   Labs (all labs ordered are listed, but only abnormal results are displayed) Labs Reviewed - No data to display  EKG None  Radiology No results found.  Procedures Procedures   Medications Ordered in ED Medications - No data to display  ED Course  I have reviewed the triage vital signs and the nursing notes.  Pertinent labs & imaging results that were available during my care of the patient were reviewed by me and considered in my medical decision making (see chart for details).    MDM Rules/Calculators/A&P                           Stable vitals, no focal deficits on neuro exam.  Patient has sutures in place already from urgent care, good approximation without any  surrounding erythema.  CT of neck, head, facial ordered based on the mechanism of fall and the patient's Parkinson dementia.  These resulted negative, there was some signs of possible degenerative change versus a fracture to the cervical spine, given he has no midline tenderness and is not complaining of any neck pain I  suspect that these are chronic instead of acute.  Patient discharged in stable condition, return precautions given.  Final Clinical Impression(s) / ED Diagnoses Final diagnoses:  None    Rx / DC Orders ED Discharge Orders     None        Sherrill Raring, PA-C 11/05/21 Posey, [place], DO 11/05/21 1914

## 2021-11-05 NOTE — Telephone Encounter (Signed)
Patient's son is calling- his father fell and was seen at [facility] for stitches- it was advised that the patient have CT scan to check for internal bleeding. Son is calling to see if that can be done at [facility]. Advised they do have that capability at that location- but if thet find something that needs attention- patient will have to be transferred. Reason for Disposition  General information question, no triage required and triager able to answer question  Answer Assessment - Initial Assessment Questions 1. REASON FOR CALL or QUESTION: "What is your reason for calling today?" or "How can I best help you?" or "What question do you have that I can help answer?"     See note  Protocols used: Information Only Call - No Triage-A-AH

## 2021-11-05 NOTE — Discharge Instructions (Signed)
Your work-up today was reassuring, no obvious signs of fracture on your vertebrae.  There is no intracranial bleeding.  Follow-up with your primary care doctor as needed, you will need to have the sutures removed in about 5 to 7 days.  Return to the ED if he starts vomiting, acting confused, or if things change or worsen.

## 2021-11-25 ENCOUNTER — Emergency Department (HOSPITAL_COMMUNITY)
Admission: EM | Admit: 2021-11-25 | Discharge: 2021-11-27 | Disposition: A | Discharge status: Home / Self Care | Payer: Medicare Other | Attending: Emergency Medicine | Admitting: Emergency Medicine

## 2021-11-25 ENCOUNTER — Other Ambulatory Visit: Payer: Self-pay

## 2021-11-25 ENCOUNTER — Emergency Department (HOSPITAL_COMMUNITY): Payer: Medicare Other

## 2021-11-25 ENCOUNTER — Encounter (HOSPITAL_COMMUNITY): Payer: Self-pay | Admitting: Emergency Medicine

## 2021-11-25 DIAGNOSIS — Z7982 Long term (current) use of aspirin: Secondary | ICD-10-CM | POA: Diagnosis not present

## 2021-11-25 DIAGNOSIS — W19XXXA Unspecified fall, initial encounter: Secondary | ICD-10-CM | POA: Diagnosis not present

## 2021-11-25 DIAGNOSIS — G20 Parkinson's disease: Secondary | ICD-10-CM | POA: Diagnosis not present

## 2021-11-25 DIAGNOSIS — Z79899 Other long term (current) drug therapy: Secondary | ICD-10-CM | POA: Insufficient documentation

## 2021-11-25 DIAGNOSIS — S2241XA Multiple fractures of ribs, right side, initial encounter for closed fracture: Secondary | ICD-10-CM | POA: Diagnosis not present

## 2021-11-25 DIAGNOSIS — Y92009 Unspecified place in unspecified non-institutional (private) residence as the place of occurrence of the external cause: Secondary | ICD-10-CM | POA: Diagnosis not present

## 2021-11-25 DIAGNOSIS — S299XXA Unspecified injury of thorax, initial encounter: Secondary | ICD-10-CM | POA: Diagnosis present

## 2021-11-25 DIAGNOSIS — W010XXA Fall on same level from slipping, tripping and stumbling without subsequent striking against object, initial encounter: Secondary | ICD-10-CM

## 2021-11-25 DIAGNOSIS — F02818 Dementia in other diseases classified elsewhere, unspecified severity, with other behavioral disturbance: Secondary | ICD-10-CM | POA: Insufficient documentation

## 2021-11-25 MED ORDER — NAPROXEN 500 MG PO TABS
250.0000 mg | ORAL_TABLET | Freq: Once | ORAL | Status: DC
Start: 2021-11-26 — End: 2021-11-25

## 2021-11-25 MED ORDER — TIZANIDINE HCL 4 MG PO TABS
4.0000 mg | ORAL_TABLET | Freq: Once | ORAL | Status: AC
  Administered 2021-11-26: 4 mg via ORAL
  Filled 2021-11-25: qty 1

## 2021-11-25 MED ORDER — FENTANYL CITRATE PF 50 MCG/ML IJ SOSY: 75.0000 ug | PREFILLED_SYRINGE | Freq: Once | INTRAMUSCULAR | Status: DC

## 2021-11-25 MED ORDER — FENTANYL CITRATE PF 50 MCG/ML IJ SOSY
50.0000 ug | PREFILLED_SYRINGE | Freq: Once | INTRAMUSCULAR | Status: AC
  Administered 2021-11-26: 50 ug via INTRAVENOUS
  Filled 2021-11-25: qty 1

## 2021-11-25 NOTE — ED Triage Notes (Addendum)
Pt BIB EMS from home due to a fall around 1900. Pt states he has fallen several times in the past few months. Pt endorses hx of dementia. Pt states that he ran into "something of my wife's" and hurt his ribs. Pt initially declined transport to the hospital, but called EMS 2 hours later because of his pain to his right ribs. Pt denies LOC, denies blood thinners, and denies hitting his head.

## 2021-11-25 NOTE — ED Provider Notes (Signed)
[facility] DEPT Provider Note   CSN: [id number] Arrival date & time: 11/25/21  2224     History Chief Complaint  Patient presents with   Corney Knighton is a 80 y.o. male.  HPI    80 year old male comes in with chief complaint of fall.  Patient has history of Parkinson disease.  He reports that he ran into something in his house and had a fall onto tiled surface in his home.  He fell onto the right side and is having pain over the right ribs.  He also thinks he struck his head.  He denies any neck pain, one-sided numbness, weakness.  Patient is not on any blood thinners.  He has ambulated with assistance since the fall.  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 Authorizing Provider  aspirin 81 MG chewable tablet Chew 81 mg by mouth daily.    [provider]  carbidopa-levodopa (SINEMET IR) 25-100 MG tablet TAKE 1 TABLET FOUR TIMES DAILY 09/23/21   Lomax, Amy, NP  donepezil (ARICEPT) 10 MG tablet Take 5 mg by mouth at bedtime.    [provider]  FIBER PO Take 1 tablet by mouth at bedtime. Patient not  taking: Reported on 07/22/2021    [provider]  furosemide (LASIX) 20 MG tablet Take 20 mg by mouth.    [provider]  rivastigmine (EXELON) 1.5 MG capsule Take 1 capsule (1.5 mg total) by mouth 2 (two) times daily. Patient not taking: Reported on 07/22/2021 05/01/21   Debbora Presto, NP    Allergies    Cyclobenzaprine  Review of Systems   Review of Systems  Constitutional:  Positive for activity change.  Respiratory:  Negative for shortness of breath.   Cardiovascular:  Positive for chest pain.  Neurological:  Positive for headaches.  Hematological:  Does not bruise/bleed easily.  All other systems reviewed and are negative.  Physical Exam Updated Vital Signs BP (!) 153/84   Pulse (!) 57   Temp 97.9 F (36.6 C) (Oral)   Resp 16   Ht 5\' 5"  (1.651 m)   Wt 82 kg   SpO2 98%   BMI 30.08 kg/m   Physical Exam Vitals and nursing note reviewed.  Constitutional:      Appearance: He is well-developed.  HENT:     Head: Atraumatic.  Cardiovascular:     Rate and Rhythm: Normal rate.  Pulmonary:     Effort: Pulmonary effort is normal. No  respiratory distress.  Chest:     Chest wall: Tenderness present.  Genitourinary:    Comments: Right lower thoracic chest tenderness laterally otherwise:  Head to toe evaluation shows no hematoma, bleeding of the scalp, no facial abrasions, no spine step offs, crepitus of the chest or neck, no tenderness to palpation of the bilateral upper and lower extremities, no gross deformities, no chest tenderness, no pelvic pain.  Musculoskeletal:     Cervical back: Neck supple.  Skin:    General: Skin is warm.  Neurological:     Mental Status: He is alert and oriented to person, place, and time.    ED Results / Procedures / Treatments   Labs (all labs ordered are listed, but only abnormal results are displayed) Labs Reviewed  BASIC METABOLIC PANEL  CBC WITH DIFFERENTIAL/PLATELET    EKG None  Radiology DG Ribs Unilateral  W/Chest Right  Result Date: 11/25/2021 CLINICAL DATA:  Posterior right ribcage trauma. EXAM: RIGHT RIBS AND CHEST - 3+ VIEW COMPARISON:  Portable chest 07/18/2021 FINDINGS: There are acute nondisplaced transverse fractures of the posterolateral right ninth and tenth ribs. There is a chronic healed fracture deformity of the posterior right sixth rib. There is increased linear atelectasis or scarring in the hypoinflated right lower lung field. There is no appreciable pneumothorax or pleural collection. There is aortic calcification and tortuosity, mild cardiomegaly with normal central vasculature. The remaining lungs are clear. There is thoracic spondylosis and multilevel bridging enthesopathy common bilateral shoulder DJD. Osteopenia. IMPRESSION: 1. Acute nondisplaced transverse fractures of the posterolateral right ninth and tenth ribs. 2. Hypoinflated study with increased linear scarring or atelectasis in the right lower lung field. No pneumothorax. 3. Cardiomegaly, with aortic atherosclerosis. Electronically Signed   By: Telford Nab M.D.   On: 11/25/2021 23:48   CT Head Wo Contrast  Result Date: 11/25/2021 CLINICAL DATA:  Head trauma fall EXAM: CT HEAD WITHOUT CONTRAST TECHNIQUE: Contiguous axial images were obtained from the base of the skull through the vertex without intravenous contrast. COMPARISON:  CT brain 11/05/2021 FINDINGS: Brain: No acute territorial infarction, hemorrhage or intracranial mass. Moderate atrophy. Stable ventricle size. Mild chronic small vessel ischemic changes of the white matter. Vascular: No hyperdense vessels.  No unexpected calcification. Skull: Normal. Negative for fracture or focal lesion. Sinuses/Orbits: No acute finding. Other: None IMPRESSION: 1. No CT evidence for acute intracranial abnormality. 2. Atrophy and mild chronic small vessel ischemic changes of the white matter. Electronically Signed   By: Donavan Foil M.D.   On: 11/25/2021 23:42     Procedures Procedures   Medications Ordered in ED Medications  tiZANidine (ZANAFLEX) tablet 4 mg (has no administration in time range)  fentaNYL (SUBLIMAZE) injection 50 mcg (has no administration in time range)    ED Course  I have reviewed the triage vital signs and the nursing notes.  Pertinent labs & imaging results that were available during my care of the patient were reviewed by me and considered in my medical decision making (see chart for details).    MDM Rules/Calculators/A&P                          80 year old male comes in with chief complaint of fall.  He is having severe right-sided chest pain.  Suspicion is high for rib fracture.  X-ray ordered to ensure there is no pneumothorax.  C-spine cleared clinically.  CT scan of the brain ordered given blunt trauma to the head.  Patient's  pelvis is stable and there is no gross deformity of her upper and lower extremities.  X-rays reviewed independently at 11:50 PM.  It does appear that he has right-sided rib fractures.  Pain medications ordered.  Patient's care signed out to Dr. Betsey Holiday, who will reassess the patient post pain medications.  Final Clinical Impression(s) / ED Diagnoses Final diagnoses:  Closed fracture of multiple ribs of right side, initial encounter    Rx / DC Orders ED Discharge Orders     None        Varney Biles, MD 11/25/21 2359

## 2021-11-26 LAB — CBC WITH DIFFERENTIAL/PLATELET
Abs Immature Granulocytes: 0.14 10*3/uL — ABNORMAL HIGH (ref 0.00–0.07)
Basophils Absolute: 0.1 10*3/uL (ref 0.0–0.1)
Basophils Relative: 1 %
Eosinophils Absolute: 0.1 10*3/uL (ref 0.0–0.5)
Eosinophils Relative: 1 %
HCT: 44.8 % (ref 39.0–52.0)
Hemoglobin: 15.1 g/dL (ref 13.0–17.0)
Immature Granulocytes: 1 %
Lymphocytes Relative: 7 %
Lymphs Abs: 1 10*3/uL (ref 0.7–4.0)
MCH: 32 pg (ref 26.0–34.0)
MCHC: 33.7 g/dL (ref 30.0–36.0)
MCV: 94.9 fL (ref 80.0–100.0)
Monocytes Absolute: 1.2 10*3/uL — ABNORMAL HIGH (ref 0.1–1.0)
Monocytes Relative: 8 %
Neutro Abs: 11.9 10*3/uL — ABNORMAL HIGH (ref 1.7–7.7)
Neutrophils Relative %: 82 %
Platelets: 261 10*3/uL (ref 150–400)
RBC: 4.72 MIL/uL (ref 4.22–5.81)
RDW: 13.1 % (ref 11.5–15.5)
WBC: 14.4 10*3/uL — ABNORMAL HIGH (ref 4.0–10.5)
nRBC: 0 % (ref 0.0–0.2)

## 2021-11-26 LAB — BASIC METABOLIC PANEL
Anion gap: 6 (ref 5–15)
BUN: 26 mg/dL — ABNORMAL HIGH (ref 8–23)
CO2: 28 mmol/L (ref 22–32)
Calcium: 8.9 mg/dL (ref 8.9–10.3)
Chloride: 105 mmol/L (ref 98–111)
Creatinine, Ser: 0.99 mg/dL (ref 0.61–1.24)
GFR, Estimated: >60 mL/min (ref >60)
Glucose, Bld: 110 mg/dL — ABNORMAL HIGH (ref 70–99)
Potassium: 4.4 mmol/L (ref 3.5–5.1)
Sodium: 139 mmol/L (ref 135–145)

## 2021-11-26 MED ORDER — ACETAMINOPHEN 500 MG PO TABS
1000.0000 mg | ORAL_TABLET | Freq: Once | ORAL | Status: AC
  Administered 2021-11-26: 1000 mg via ORAL
  Filled 2021-11-26: qty 2

## 2021-11-26 MED ORDER — ONDANSETRON HCL 4 MG/2ML IJ SOLN
4.0000 mg | Freq: Once | INTRAMUSCULAR | Status: AC
  Administered 2021-11-26: 4 mg via INTRAVENOUS
  Filled 2021-11-26: qty 2

## 2021-11-26 MED ORDER — MORPHINE SULFATE (PF) 4 MG/ML IV SOLN
4.0000 mg | Freq: Once | INTRAVENOUS | Status: AC
Start: 2021-11-26 — End: 2021-11-26
  Administered 2021-11-26: 4 mg via INTRAVENOUS
  Filled 2021-11-26: qty 1

## 2021-11-26 NOTE — ED Notes (Signed)
Pt given a sandwich and drink.

## 2021-11-26 NOTE — ED Notes (Signed)
Dinner tray given

## 2021-11-26 NOTE — Progress Notes (Signed)
CSW spoke with pt's wife [name] [phone]) and provided update.   SNF beds stil pending.   Arlie Solomons.Teala Daffron, MSW, [place]   Transitions of Care Clinical Social Worker I Direct Dial: [phone]   Fax: [phone] [email or web address]

## 2021-11-26 NOTE — Progress Notes (Signed)
Bed offers pending.   Inocente Krach M.Aleese Kamps, MSW, LCSWA [place]  Transitions of Care Clinical Social Worker I Direct Dial: [phone]  Fax: [phone] [email or web address]  

## 2021-11-26 NOTE — Evaluation (Signed)
Physical Therapy Evaluation Patient Details Name: Kyle Mcguire MRN: [id number] DOB: 1941-04-24 Today's Date: 11/26/2021  History of Present Illness  80 year old male comes in with chief complaint of fall.  Patient has history of Parkinson disease, Dementia.  Clinical Impression   The patient is leasant, oriented x 3, aware of falls PTA. Patient resides with wife. Patient  required max/total assistance to sit up and return to supine, indicating pain on left side. Patient stood and took a few side steps to [facility]. Patient will benefit from SNF for rehab at this time. Pt admitted with above diagnosis.   Pt currently with functional limitations due to the deficits listed below (see PT Problem List). Pt will benefit from skilled PT to increase their independence and safety with mobility to allow discharge to the venue listed below.          Recommendations for follow up therapy are one component of a multi-disciplinary discharge planning process, led by the attending physician.  Recommendations may be updated based on patient status, additional functional criteria and insurance authorization.  Follow Up Recommendations Skilled nursing-short term rehab (<3 hours/day)    Assistance Recommended at Discharge Frequent or constant Supervision/Assistance  Functional Status Assessment Patient has had a recent decline in their functional status and demonstrates the ability to make significant improvements in function in a reasonable and predictable amount of time.  Equipment Recommendations  None recommended by PT    Recommendations for Other Services       Precautions / Restrictions Precautions Precautions: Fall      Mobility  Bed Mobility Overal bed mobility: Needs Assistance Bed Mobility: Rolling;Sidelying to Sit;Sit to Supine Rolling: Max assist Sidelying to sit: Total assist;HOB elevated   Sit to supine: Max assist;HOB elevated   General bed mobility comments: HOB raised. patient  assisted with legs to bed edge. total assistance to sit upright. patient could scoot to bed edge using UE's. Max assistance for legs and trunk to return to supine.    Transfers Overall transfer level: Needs assistance   Transfers: Sit to/from Stand Sit to Stand: Mod assist           General transfer comment: patient held onto back of chair, stood with mod assistance , took 4 side steps to head of bed. patient able to sit back onto be d .    Ambulation/Gait                  Stairs            Wheelchair Mobility    Modified Rankin (Stroke Patients Only)       Balance Overall balance assessment: Needs assistance;History of Falls Sitting-balance support: Feet supported;Bilateral upper extremity supported Sitting balance-Leahy Scale: Poor Sitting balance - Comments: sits near mid line, tends posterior  bias. Postural control: Posterior lean Standing balance support: Bilateral upper extremity supported;During functional activity;Reliant on assistive device for balance Standing balance-Leahy Scale: Poor                               Pertinent Vitals/Pain Pain Assessment: 0-10 Pain Score: 6  Pain Location: left ribs, right shoulder Pain Descriptors / Indicators: Crushing;Discomfort;Grimacing;Guarding Pain Intervention(s): Limited activity within patient's tolerance;Monitored during session    [place] expects to be discharged to:: Private residence Living Arrangements: Spouse/significant other Available Help at Discharge: Family;Available PRN/intermittently Type of Home: House Home Access: Level entry       Home  Layout: One level Home Equipment: Rollator (4 wheels);Shower seat      Prior Function Prior Level of Function : Needs assist  Cognitive Assist : Mobility (cognitive);ADLs (cognitive) Mobility (Cognitive): Intermittent cues ADLs (Cognitive): Intermittent cues Physical Assist : Mobility (physical);ADLs  (physical) Mobility (physical): Transfers;Gait   Mobility Comments: recent  multiple fallls.       Hand Dominance   Dominant Hand: Left    Extremity/Trunk Assessment   Upper Extremity Assessment Upper Extremity Assessment: RUE deficits/detail (noted mild restin tremors) RUE Deficits / Details: reports pain when arm elevated    Lower Extremity Assessment Lower Extremity Assessment: Generalized weakness    Cervical / Trunk Assessment Cervical / Trunk Assessment: Kyphotic  Communication   Communication: No difficulties  Cognition Arousal/Alertness: Awake/alert Behavior During Therapy: WFL for tasks assessed/performed Overall Cognitive Status: History of cognitive impairments - at baseline                                 General Comments: oriented to WL, falls, some memory  deficits about recnet events.        General Comments      Exercises     Assessment/Plan    PT Assessment Patient needs continued PT services  PT Problem List Decreased strength;Decreased safety awareness;Decreased range of motion;Decreased coordination;Decreased knowledge of precautions;Decreased activity tolerance;Decreased cognition;Decreased balance;Pain       PT Treatment Interventions DME instruction;Therapeutic activities;Cognitive remediation;Gait training;Therapeutic exercise;Patient/family education;Functional mobility training;Balance training;Neuromuscular re-education    PT Goals (Current goals can be found in the Care Plan section)  Acute Rehab PT Goals Patient Stated Goal: to get therapy PT Goal Formulation: With patient Time For Goal Achievement: 12/10/21 Potential to Achieve Goals: Fair    Frequency Min 2X/week   Barriers to discharge Decreased caregiver support      Co-evaluation               AM-PAC PT "6 Clicks" Mobility  Outcome Measure Help needed turning from your back to your side while in a flat bed without using bedrails?: A Lot Help  needed moving from lying on your back to sitting on the side of a flat bed without using bedrails?: A Lot Help needed moving to and from a bed to a chair (including a wheelchair)?: A Lot Help needed standing up from a chair using your arms (e.g., wheelchair or bedside chair)?: A Lot Help needed to walk in hospital room?: Total Help needed climbing 3-5 steps with a railing? : Total 6 Click Score: 10    End of Session   Activity Tolerance: Patient tolerated treatment well Patient left: in bed;with call bell/phone within reach Nurse Communication: Mobility status PT Visit Diagnosis: Unsteadiness on feet (R26.81);Other symptoms and signs involving the nervous system (R29.898);Pain;Difficulty in walking, not elsewhere classified (R26.2) Pain - Right/Left: Right Pain - part of body: Shoulder    Time: 4098-1191 PT Time Calculation (min) (ACUTE ONLY): 25 min   Charges:   PT Evaluation $PT Eval Low Complexity: 1 Low PT Treatments $Therapeutic Activity: 8-22 mins        Kyle Mcguire PT Acute Rehabilitation Services Pager [phone] Office [phone]   Kyle Mcguire 11/26/2021, 10:08 AM

## 2021-11-26 NOTE — Progress Notes (Signed)
.  Transition of Care [facility]) - Emergency Department Mini Assessment   Patient Details  Name: Kyle Mcguire MRN: [id number] Date of Birth: May 14, 1941  Transition of Care [facility]) CM/SW Contact:    Illene Regulus, LCSW Phone Number: 11/26/2021, 9:50 AM   Clinical Narrative:  TOC CSW spoke with pt at bedside, pt stated he fell in his home. Pt live with his wife in a single family home with no stairs. Pt stated using a walker at baseline. Pt reported going for skilled nursing before but does not know the name of facility. Pt gave consent to speak with his wife.  CSW attempted to speak with pt's wife [name] Scherzinger,([phone]), no answer, left HIPPA compliant VM.  Pt is waiting on PT eval for recommendations. CSW continue to follow  ED Mini Assessment: What brought you to the Emergency Department? 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 [address] [place] [address] [place] [address] Phone: [phone] Fax: [facility] [address], [place] [address] [place]  [place] [address] Phone: [phone] Fax: [phone]

## 2021-11-26 NOTE — Progress Notes (Signed)
Pt agreed to placement, CSW to work pt up for SNF.   Arlie Solomons.Lisbet Busker, MSW, [place]   Transitions of Care Clinical Social Worker I Direct Dial: [phone]   Fax: [phone] [email or web address]

## 2021-11-26 NOTE — NC FL2 (Signed)
°  [place] LEVEL OF CARE SCREENING TOOL     IDENTIFICATION  Patient Name: Kyle Mcguire Birthdate: [birth year] Sex: male Admission Date (Current Location): 11/25/2021  [facility] and [place] Number:  Herbalist and Address:  [facility],  [address] [place] [address], [place]      Provider Number: [id number]  Attending Physician Name and Address:  Varney Biles, MD  Relative Name and Phone Number:  [place], Mcclenahan (Spouse)   [phone]    Current Level of Care: Hospital Recommended Level of Care: [place] Prior Approval Number:    Date Approved/Denied:   PASRR Number: [id number]  Discharge Plan: SNF    Current Diagnoses: Patient Active Problem List   Diagnosis Date Noted   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   Snoring 09/21/2017   Memory loss 08/26/2017   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   Parkinson's disease ([place]) 12/31/2014    Orientation RESPIRATION BLADDER Height & Weight     Self, Situation, Place  Normal Continent Weight: 180 lb 12.4 oz (82 kg) Height:  5\' 5"  (165.1 cm)  BEHAVIORAL SYMPTOMS/MOOD NEUROLOGICAL BOWEL NUTRITION STATUS      Continent Diet (regular)  AMBULATORY STATUS COMMUNICATION OF NEEDS Skin   Extensive Assist   Normal                       Personal Care Assistance Level of Assistance  Bathing, Feeding, Dressing Bathing Assistance: Limited assistance Feeding assistance: Independent Dressing Assistance: Limited assistance     Functional Limitations Info  Sight, Hearing, Speech Sight Info: Adequate Hearing Info: Adequate Speech Info: Adequate    SPECIAL CARE FACTORS FREQUENCY  PT (By licensed PT), OT (By licensed OT)     PT Frequency: 5 x a week OT Frequency: 5 x a week            Contractures Contractures Info: Present    Additional Factors Info  Code Status, Allergies   Allergies Info: Cyclobenzaprine            Current Medications (11/26/2021):  This is the current hospital active medication list No current facility-administered medications for this encounter.   Current Outpatient Medications  Medication Sig Dispense Refill   aspirin 81 MG chewable tablet Chew 81 mg by mouth daily.     carbidopa-levodopa (SINEMET IR) 25-100 MG tablet TAKE 1 TABLET FOUR TIMES DAILY (Patient taking differently: Take 1 tablet by mouth 4 (four) times daily. 0800, 1200, 1400, 2000) 360 tablet 1   FIBER PO Take 1 tablet by mouth every other day.       Discharge Medications: Please see discharge summary for a list of discharge medications.  Relevant Imaging Results:  Relevant Lab Results:   Additional Information [phone]  COVID x [place], LCSW

## 2021-11-26 NOTE — Progress Notes (Addendum)
CSW spoke with pt's son Kyle Mcguire at bedside, CSW provided an update, pt's son requested pt information be sent out to facilities in [place] and [place]. He reported pt was previously at [facility] in [place].    3:11pm CSW attempted to contact [facility] to fax out referral , left message with admission director Baird Lyons requesting a return call.   Arlie Solomons.Dequarius Jeffries, MSW, [place]   Transitions of Care Clinical Social Worker I Direct Dial: [phone]   Fax: [phone] [email or web address]

## 2021-11-27 MED ORDER — HYDROCODONE-ACETAMINOPHEN 5-325 MG PO TABS
0.5000 | ORAL_TABLET | ORAL | Status: DC | PRN
  Administered 2021-11-27: 04:00:00 0.5 via ORAL
  Filled 2021-11-27: qty 1

## 2021-11-27 MED ORDER — DOCUSATE SODIUM 100 MG PO CAPS
100.0000 mg | ORAL_CAPSULE | Freq: Every day | ORAL | Status: DC
  Administered 2021-11-27: 04:00:00 100 mg via ORAL
  Filled 2021-11-27: qty 1

## 2021-11-27 MED ORDER — CARBIDOPA-LEVODOPA 25-100 MG PO TABS
1.0000 | ORAL_TABLET | ORAL | Status: DC
  Administered 2021-11-27 (×3): 1 via ORAL
  Filled 2021-11-27 (×5): qty 1

## 2021-11-27 MED ORDER — TUBERCULIN PPD 5 UNIT/0.1ML ID SOLN
5.0000 [IU] | INTRADERMAL | Status: DC
  Filled 2021-11-27: qty 0.1

## 2021-11-27 NOTE — Progress Notes (Signed)
CSW spoke with Levada Dy from she is requesting pt's Face sheet fax over. CSW faxed Face sheet out.  Arlie Solomons.Sadiyah Kangas, MSW, [place]   Transitions of Care Clinical Social Worker I Direct Dial: [phone]   Fax: [phone] [email or web address]

## 2021-11-27 NOTE — ED Notes (Signed)
Pt's wife called and was upset that pt isn't receiving his [place]. Dr. Jeanell Sparrow EDP notified.

## 2021-11-27 NOTE — Progress Notes (Addendum)
CSW attempted to contact pt's wife no response left HIPPA compliant VM.   CSW spoke with pt's son at bedside , and provided bed offers, he reported he will discuss offers with his mother. CSW reminded pt's son pt will need prior auth ,and can not wait long for decision.    11:00am CSW received a call from [place] , she reported pt's son called and asked to review pt. CSW faxed pt's paperwork over. Pt was accepted to facility . CSW to submit auth.  Arlie Solomons.Shada Nienaber, MSW, [place]   Transitions of Care Clinical Social Worker I Direct Dial: [phone]   Fax: [phone] [email or web address]

## 2021-11-27 NOTE — ED Provider Notes (Signed)
SW/TOC indicates pt ready for d/c to ECF/rehab.   Pt alert, content, no distress, normal vitals, breathing comfortably.   Family here to transport - all indicate feels ready to go.   Pt currently appears stable for d/c.      Lajean Saver, MD 11/27/21 [phone]

## 2021-11-27 NOTE — Discharge Instructions (Addendum)
It was our pleasure to provide your ER care today - we hope that you feel better.  For rib fractures, stay active, take full and deep breaths. May use incentive spirometer - 10 full and complete breaths in every hour while awake.   Follow up with primary care doctor in the next couple weeks.   Return to ER if worse, new symptoms, fevers, new/severe pain, trouble breathing, or other concern.

## 2021-11-27 NOTE — Progress Notes (Addendum)
Pt's Kyle Mcguire was approved [phone]  Pt to d/c to [facility] rehab. Room 8119 call report [phone]   2:18pm Pt's son stated he will transport pt to facility , he reported being here within the hour.    Arlie Solomons.Shatonya Passon, MSW, [place]   Transitions of Care Clinical Social Worker I Direct Dial: [phone]   Fax: [phone] [email or web address]

## 2021-11-27 NOTE — ED Notes (Signed)
Pt alert, oriented x 2. Pt denies any complaints at this time. Water offered, pt refused.

## 2021-11-27 NOTE — ED Notes (Signed)
Attempted to give report to [facility] rehab [phone], voicemail left.

## 2021-12-02 ENCOUNTER — Telehealth: Payer: Self-pay | Admitting: Family Medicine

## 2021-12-02 NOTE — Telephone Encounter (Signed)
We reviewed pt chart. Appears appt desk states pt cx appt w/ Dr. Jaynee Eagles 12/10/21 d/t being in rehab facility. Amy has no openings this week and off next week. Does Dr. Jaynee Eagles have anything available that you could work him into?

## 2021-12-02 NOTE — Telephone Encounter (Addendum)
Slot became available 12-12-21 at 0915 with Dr. Jaynee Eagles.  Spoke to wife, pt is at the facility in [place] (rehab) will possibly going home that day(she is fighting with insurance to have him stay).  She was grateful for the earlier appt, and if something comes earlier to call her back.  Her son will be coming with her, she is aware to come 15-30 min prior to appt.

## 2021-12-02 NOTE — Telephone Encounter (Signed)
Pt's wife has called to report she should not have cancelled pt's appointment on 12-27.  Pt's wife asked why could we not call the pt that is in that appointment slot and ask that they r/s.  It was explained that we would not be able to do that. Wife is asking that Dr Jaynee Eagles calls her because pt may not be alive in January(which is when Dr Cathren Laine next available shows to be).  Wife declined the wait list for the January appointment also, please call.

## 2021-12-10 ENCOUNTER — Ambulatory Visit: Payer: Medicare HMO | Admitting: Neurology

## 2021-12-12 ENCOUNTER — Encounter: Payer: Self-pay | Admitting: Neurology

## 2021-12-12 ENCOUNTER — Ambulatory Visit: Payer: Medicare Other | Admitting: Neurology

## 2021-12-12 VITALS — BP 117/66 | HR 67 | Ht 65.0 in | Wt 190.8 lb

## 2021-12-12 DIAGNOSIS — F02C0 Dementia in other diseases classified elsewhere, severe, without behavioral disturbance, psychotic disturbance, mood disturbance, and anxiety: Secondary | ICD-10-CM | POA: Diagnosis not present

## 2021-12-12 DIAGNOSIS — G20 Parkinson's disease: Secondary | ICD-10-CM | POA: Diagnosis not present

## 2021-12-12 MED ORDER — CARBIDOPA-LEVODOPA 25-100 MG PO TABS
1.0000 | ORAL_TABLET | Freq: Four times a day (QID) | ORAL | 4 refills | Status: DC
Start: 2021-12-12 — End: 2022-03-27

## 2021-12-12 NOTE — Patient Instructions (Signed)
Parkinson's Disease Parkinson's disease is a movement disorder. It is a long-term condition that gets worse over time. Each person with Parkinson's disease is affected differently. This condition limits a person's ability to control movements and move the body normally. The condition can range from mild to severe. Parkinson's disease tends to get worse slowly over several years. What are the causes? Parkinson's disease is caused by a loss of brain cells (neurons) that make a brain chemical called dopamine. Dopamine is needed to control movement. As the condition gets worse, more neurons that make dopamine die. This makes it hard to move or control your movements. The exact cause of the loss of neurons is not known. Genes and the environment may contribute to the cause of Parkinson's disease. What increases the risk? The following factors may make you more likely to develop this condition: Being male. Being age 35 or older. Having a family history of Parkinson's disease. Having had a traumatic brain injury. Having been exposed to toxins, such as pesticides. Having depression. What are the signs or symptoms? Symptoms of this condition can vary. The main symptoms are related to movement. These include: A tremor or shaking while you are resting. You cannot control the shaking. Stiffness in your arms and legs (rigidity). Slowing of movement. You may lose facial expressions and have trouble making small movements that are needed to button clothing or brush your teeth. An abnormal walk. You may walk with short, shuffling steps. Loss of balance and stability when standing. You may sway, fall backward, and have trouble making turns. Other symptoms include: Mental or cognitive changes, including: Depression or anxiety. Having false beliefs (delusions). Seeing, hearing, or feeling things that do not exist (hallucinations). Trouble speaking or swallowing. Changes in bowel or bladder functions,  including constipation, having to go urgently or frequently, or not being able to control your bowel or bladder. Changes in sleep habits, acting out dreams, or trouble sleeping. Depending on the severity of the symptoms, Parkinson's disease may be mild, moderate, or advanced. Parkinson's disease progression is different for everyone. Some people may not progress to the advanced stage. Mild Parkinson's disease involves: Movement problems that do not affect daily activities. Movement problems on one side of the body. Moderate Parkinson's disease involves: Movement problems on both sides of the body. Slowing of movement. Coordination and balance problems. Advanced Parkinson's disease involves: Extreme difficulty walking. Inability to live alone safely. Signs of dementia, such as having trouble remembering things, doing daily tasks such as getting dressed, and problem solving. How is this diagnosed? This condition is diagnosed by a specialist. A diagnosis may be made based on symptoms, your medical history, and a physical exam. You may also have brain imaging tests to check for loss of neurons in the brain. How is this treated? There is no cure for Parkinson's disease. Treatment focuses on managing your symptoms. Treatment may include: Medicines. Everyone responds to medicines differently. Your response may change over time. Work with your health care provider to find the best medicines for you. Speech, occupational, and physical therapy. Deep brain stimulation surgery to reduce tremors and other involuntary movements. Follow these instructions at home: Medicines Take over-the-counter and prescription medicines only as told by your health care provider. Avoid taking medicines that can affect thinking, such as pain or sleeping medicines. Eating and drinking Follow instructions from your health care provider about eating or drinking restrictions. Do not drink alcohol. Activity Ask your health  care provider if it is safe for you  to drive. Do exercises as told by your health care provider or physical therapist. Lifestyle  Install grab bars and railings in your home to prevent falls. Do not use any products that contain nicotine or tobacco. These products include cigarettes, chewing tobacco, and vaping devices, such as e-cigarettes. If you need help quitting, ask your health care provider. Consider joining a support group for people with Parkinson's disease. General instructions Work with your health care provider to know the kind of day-to-day help that you may need and what to do to stay safe. Keep all follow-up visits. This is important. Follow-up visits include any visits with a physical therapist, speech therapist, or occupational therapist. Where to find more information [employer] [facility] St[facility]: [employer] [place]: www.parkinson.org Contact a health care provider if: Medicines do not help your symptoms. You are unsteady or have fallen at home. You need more support to function well at home. You have trouble swallowing. You have severe constipation. You are having problems with side effects from your medicines. You feel confused, anxious, depressed, or have hallucinations. Get help right away if you: Are injured after a fall. Cannot swallow without choking. Have chest pain or trouble breathing. Do not feel safe at home. Have thoughts about hurting yourself or others. These symptoms may represent a serious problem that is an emergency. Do not wait to see if the symptoms will go away. Get medical help right away. Call your local emergency services (911 in the [place] Do not drive yourself to the hospital. If you ever feel like you may hurt yourself or others, or have thoughts about taking your own life, get help right away. Go to your nearest emergency department or: Call your local emergency services (911 in the  [place] Call a suicide crisis helpline, such as the [facility] at (779)646-2992 or 988 in the [place]. This is open 24 hours a day in the [place] Text the Crisis Text Line at 930-357-4822 (in the [place].). Summary Parkinson's disease is a long-term condition that gets worse over time. This condition limits your ability to control your movements and move your body normally. There is no cure for Parkinson's disease. Treatment focuses on managing your symptoms. Work with your health care provider to know the kind of day-to-day help that you may need and what to do to stay safe. Keep all follow-up visits, including any visits with a physical therapist, speech therapist, or occupational therapist. This is important. This information is not intended to replace advice given to you by your health care provider. Make sure you discuss any questions you have with your health care provider. Document Revised: 06/26/2021 Document Reviewed: 03/18/2021 [employer] Patient Education  [place].

## 2021-12-12 NOTE — Progress Notes (Signed)
[facility]    Provider:  Dr Jaynee Eagles Referring Provider: Vernie Shanks, MD Primary Care Physician:  Vernie Shanks, MD CC:  PD  Dr. Jaynee Eagles December 12, 2021: Patient with a history of Parkinson's disease, Parkinson's disease with dementia, hearing loss, REM sleep behavior disorder, orthostatic hypotension, falls with recent admissions for multiple falls last was within the last month which required sutures to his head.  He has been seen at the emergency room in August of this year as well as November and December of this year.  After his admission in August he had occupational and physical and speech therapy.  Also been to a rehab center in August followed by the therapy in September.  In November he fell also in December. He is going to [place] for long term care, [place] and being discharged to [facility] for long term. He can use a walker. He has not been getting dizzy. He goes to PT, mostly bound to wheelchair. The aricept was taken off. No problems sleeping. He has hallucintaions and delusions that bother him.  Spoke to son about PD, UTIs, environmental factors, hospice, delusions, hallucinations, and answered all questions. [name] 07/22/21 ALL:  Kyle Mcguire presents, today, for an acute care visit following ER evaluation 07/18/2021 for weakness. Kyle Mcguire is seen by [place] for PD with associated dementia. He has continued carb/levo 1 tablet QID. He was prescribed rivastigmine 1.5mg  tablet BID (switched from Exelon patch in 03/2021 due to expense). Wife reports she was unable to afford oral tablet and restarted him on donepezil (used old bottle, we were unaware). Up until two weeks ago, he was going to kick boxing with [employer] twice a week.   He has been experiencing waxing and waning cognition and dizziness for the past 6-12 months. His wife states that he was seen by PCP on 07/15/2021 for worsening swelling of lower extremities. PCP started him on  Lasix 20mg  daily and updated blood work. They advised she call us for work in appt due to dizziness. On 07/18/2021, he was no better prompting her to call EMS. ER evaluation with chest xray, MRI and labs was unremarkable. He was discharged home after IV fluids. He was assisted to car by 2 nurses and his wife, per her report. She was able to get him home but reported continued worsening 8/5 and 8/6. She called EMS for evaluation 8/6 and was advised that vital signs were normal and ER evaluation not recommended.   Today, she called the office to report that she could not get him out of the bed. At 5:30am she went to answer his call to use the restroom and he had a very difficult time getting out of the bed. It took her 10 minutes to get him up and to the restroom. He is much more sleepy than normal. He denies pain. Kyle Mcguire reports that he complains of being cold. He has not had a temperature. She performed home Covid test at some point over the past week and it was negative. She has not repeated test. No known exposure. He is vaccinated. He seems to be eating fairly normally but she is having to spoon feed him. He can hold a sandwich but unable to operate utensils. He is not drinking much fluids. He usually has about 4 ounces with medications 4 times daily and at best has about 8 ounces with dinner. He is not as sharp from cognitive standpoint. He is  more confused than normal. No specific falls but she reports that he has stumbled and fell against furniture many times over the past week. She has to reposition him in a chair.   He continues carb/levo 8am, 12pm, 4pm, 8pm. There is no correlation with timing of medication and symptoms. She reports that weakness if generalized and present all day. Weakness is progressively and significantly worse over the past week. She does not feel she can care for him at home. She called the fire department to get him to his appt, today.   04/11/2021 ALL:  Kyle Mcguire is a 80  y.o. male here today for follow up for PD with dementia. He continues Sinemet QID and Exelon 4.6mg  daily. He feels that balance is worsening. He has a cane but does not use. He worked with PT years ago but did not feel it was helpful. No pain. He continues kickboxing twice a week. Memory seems to be declining. He has a harder time finding words. He is able to perform ADLs. He is eating normally. He is sleeping more. He sets reminder on his phone to help with medication administration. Insurance no longer covering Exelon patch. He has tried and failed Aricept. Aricept caused significant diarrhea.  05/16/2020: No complaints of dizziness. His wife provided much information. He is going for b12 shots and feeling better. He is getting injections. No falls, new symptoms, stable. He is even behaving himself. He is walking every day now, little walks. He is doing well. Stable with sinemet. Taking the Exelon Patch. He was having back problems, he has degenerative disk disease, he is in physical therapy, he has been to [facility]. He will restart the [facility] after that. He says he is feeling well, he is walking, he is going to PT, he feels stable, no falls. He mows his son's lawn weekly.   12/13/2019; Wife says she has to help him with a lot more things. He is very mellow, good mood. He doesn't complain of anything. She hs to give him cues to do things like brush teeth. No pain. He still is not very active. He does however take a short walk every day. He drinks pepsi daily, he also drinks a lot of flavored water. Try to get low cal and pepsi no caffeine. Discussed with wife in detail. He is still dizzy but it is better. He has started falling, balance is deteriorating. We will have PT to his house for walking assistance training and balance    Interval history 03/16/2019: Spoke with wife and patient. They are feeling well. They are staying in the house. No falls. Tremor has not worsened. They have a timer on his phone.  He has not been dizzy, stopped drinking pepsi totally, no dizziness or lightheadedness. He drinks water and sparkling water. No swallowing problems. He has lost weight but this is good. He is going to rock steady 2x a week. Memory stable. Still driving, no accidents. Aricept causing diarrhea will try an exelon patch.  Interval history 09/13/2018: Lovely gentleman here with his wife for follow up of PD.  He was seen by Dr. Marcos Eke who provided much information on his condition and also recommendations such as activities and exercise. 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 Will request they meet with the social worker at [facility]. He goes ro [employer] 2x a week which is new and excellent.  He has not been dizzy lately, discussed hydration. No recent falls - 5-6 months  ago he fell but none since . No swallowing difficulties.  No difficulties with the Sinemet. Tremors are stable, not bothersome. He is stable, does not want to change any management, will keep om the same dose of Sinemet. He is drinking more sprakling water and no pepsi and he likes it. No problems swallowing. Starting to have back problems. He is inactive most days except the boxing now 2x a week. He was dxed with REM sleep disorder but it doesn't affect him. They bowl every Monday.    Interval history 08/25/2017: Had falls when on Flexeril but none since then because it made him dizzy. Still having dizziness. Memory changes started slowly 3-4 years ago and slowly progressive. Started with short-term memory issues. He would forget sometimes which road to take, little things. But not progressive and wosening. Wife has been managing his medications "for a while". Confusion worsens later in the day.He used to pay the bills but now his wife pays for them. Wife makes out the checks because of the shaking. Wife found bills in the past that were not paid. He is less social. He doesn't remember their anniversary anymore, he used to all the time. He is in  a different room because of the snoring, he nods off a lot during the day. Wife is here and provides all information. He gets up and has breakfast, he broses through the paper and then he lays on the couch until 11am nodding off. He sits in the recliner all afternoon. Wife feels that he is developing dementia and has had a change in personality.has changed, he used to be very active, he used to work part time. He does take care of the laundry and cleans up after dinner. They started bowling, wife is trying to get him out more often.He is anxious and worrying a lot.He worries a lot. He has mellowed which is good. No hallucinations.     Interval history: He feels like the dizziness has improved. Discussed northera. He feels that the dizziness is not too significant. We tried Florinef but his bllod pressure was too elevated. He last took the Sinemet at 7 this morning and he has no tremor, then he takes it at noon and then at 7pm and he goes to sleep at 10pm. He feels under control. No risk of fallin gfrom the dizziness, he has been drinking more water and feels better and less dizzy. His blood pressures from the last month have ranged 120-143/ 77-89 which is excellent. Hydrating more. One appointment was 101/82.  3-4 glasses of water a day and lemonade as well. No swallowing issues. No falls.    HPI:  Kyle Mcguire is a wonderful 80 y.o. male here as a follow up for parkinson's disease. He is doing on Sinemet. 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 He was started on Florinef at last appointment and comes in today with his wife for the first time to discuss.    Interval history 09/25/2016:  Patient comes in with wife today, this the first time I'm meeting her. We reviewed the patient's history and the medications we have tried. Patient's tremor responds to Sinemet. In the past we had him on 3 pills a day and patient felt as though the Effexor good but he  felt them wearing off during the day. We increased him to 4 pills. Discussed the proper way to take it, without protein as this might interfere with absorption. Also advised he can take the  pills every 3-4 hours. I asked his wife to notice when she sees the tremor improved after taking medication and if she notices the medications wearing off. Also had a long discussion about hydration, patient drinks several Cokes daily but does not drink much fluid. I advised that this may be what is causing his dizziness when he stands up. Orthostatic hypotension can also be seen in  Parkinson's disease or caused by Sinemet. Asked them to keep a fluids diary. The Florinef is making his blood pressure too high advised him to stop it.   Interval history 08/2016: Tremor is good. He is dizzy all the time. Things are blurry when he looks at them. He feels dizzy even now when sitting. His orthostatics have been normal. He takes the carbidopa/levodopa 4 x a day. He last took the medication at 8am this morning and his tremor is fine. Will go back to 3 pills of Sinemet a day and see if this improved his dizziness. if needed, florinef 0.1, 1/2 pill up to bid for starters   Interval history 01/2016: He is on 1 tab 4 times a day every 4 hours. His tremor is better. He takes the medication around 8am he feels the tremor about 10 - 10:30. Discussed that can take it every 3 hours instead. He feels the medication wearing off. However very mild tremor right now and he took his pill at 8am. He is having dizziness all the time. That started a month ago. There was no change in medication a month ago. Worse when moving. He is not drinking enough water. In the morning he has coffee. At lunch time he has a sandwhich or half a sandwich and lemonade. And a full glass of water with his supper. No room spinning. He is lightheaded, no CP, no SOB. No falls. No dysphagia. Not with sitting or laying. More upon standing and ambulating.    Interval history  07/2015: He took the sinemet this morning at 7:30am. The tremor is better right now. The sinemet is helping it, it just wears off.  Not hard for him to take it 3x a day. Once in a while he forgets. He doesn't know how long the sinemet lasts. No dizziness when he stands up. Not lightheaded, no symptoms fron the sinemet. Right now at this moment he is happy with his tremor, last took the Sinemet a few hours ago. He gets up at 7:30 amd walks to sheets. He goes to bed between 10 and11pm. Feels like his voice is getting lower. No problems with sleeping or vivid dreams. No falls. Sense of smell and taste is ok. No dysphagia. No memory changes. No hallucinations. No falls. No drooling. Noticed slight tremor in the right hand by the end of the appointment.     Vould try taking the Sinemet every 4 hours. 8am, noon, 4pm and 8pm. In the morning his handwriting is worse but he takes the sinemet and that is fine, he doesn't need a long-acting at night. But right    Interval update 04/26/2015: He stopped the Sinemet 2 weeks ago. Says it didn't help. Explained that we may need to increase it. Discussed in depth the plan to slowly increase the Sinemet, that this is medication for PD but we need to increase slowly. If it didn't help, dose was too low and we had plans to increase.  No side effects.       Visit 3/3:  Kyle Mcguire is a 80 y.o. male here as  a follow up.    Azilect not helping with the tremor and making him dizzy. Otherwise symptoms are the same.    Reviewed notes, labs and imaging from outside physicians, which showed:   Kyle Brsain 12/2014:  This is a borderline normal MRI of the brain without contrast showing a mild extent of white matter foci that are likely due to age-related microvascular change. There is associated minimal atrophy.  There are no acute findings.   Initial visit 12/27/2014:  Kyle Mcguire is a 80 y.o. male with no significant PMHx who is here as a consult from Dr. Jacelyn Grip for Tremors.  Tremors started 8-9 months ago. Started in the right hand. He noticed it when he tried to write, penmanship was smaller and shaky. Staying about the same. Left arm is fine. Walking fine. Sense of smell ok. Has constipation. He just moved here from [place]. Lives in a town house. Plays golf, lives independently. Can't read his writing anymore. No stiffness. Father had a tremor. Balance is getting poor, if he bends down he may fall over. Vision is changing, "hazy" vision when driving at night. Worked in trucking for 33 years. He can't pick up as much as he used to. He is very forgetful. He will forget recent conversations, his wife will tell him something and a few hours later he may forget. Dad with Parkinson's Disease. Tremor is mostly at rest or when writing. Was walking the dog and the dog went quickly and he couldn't stop himself from falling down, and broke his wrist. Sometimes when he speeds up he can't stop, has to "stutter step" to turn. No neck pain. He takes daily aspirin, no history of stroke or TIA. Denies DM or heart disease. He takes no medication. He does not drink alcohol and never smoked.    Reviewed notes, labs and imaging from outside physicians, which showed: Was sent from [facility] for evaluation of tremor. CMP wnl, LDL 98,    MRI of the brain:  This is a borderline normal MRI of the brain without contrast showing a mild extent of white matter foci that are likely due to age-related microvascular change. There is associated minimal atrophy.  There are no acute findings.   Review of Systems: Patient complains of symptoms per HPI as well as the following symptoms: back pain, cyst . Pertinent negatives and positives per HPI. All others negative     Social History   Socioeconomic History   Marital status: Married    Spouse name: [name]    Number of children: 2   Years of education: 12   Highest education level: Not on file  Occupational History   Occupation: Retired   Tobacco Use    Smoking status: Never   Smokeless tobacco: Never  Vaping Use   Vaping Use: Never used  Substance and Sexual Activity   Alcohol use: No    Alcohol/week: 0.0 standard drinks   Drug use: No   Sexual activity: Not on file  Other Topics Concern   Not on file  Social History Narrative   Patient lives at home with wife [name]   Patient has 2 children    Patient has a high school education    Patient is right handed    Patient is retired    Caffeine use: 1 cup coffee (not daily)   Social Determinants of Radio broadcast assistant Strain: Not on file  Food Insecurity: Not on file  Transportation Needs: Not on file  Physical Activity: Not on file  Stress: Not on file  Social Connections: Not on file  Intimate Partner Violence: Not on file    Family History  Problem Relation Age of Onset   Parkinsonism Father     Past Medical History:  Diagnosis Date   Dementia ([place])    Falls    Hearing loss    Parkinson's disease [facility])     Past Surgical History:  Procedure Laterality Date   no surgical history per patient     None      Current Outpatient Medications  Medication Sig Dispense Refill   aspirin 81 MG chewable tablet Chew 81 mg by mouth daily.     FIBER PO Take 1 tablet by mouth every other day.     HYDROcodone-acetaminophen (NORCO/VICODIN) 5-325 MG tablet Take 1 tablet by mouth every 6 (six) hours as needed for moderate pain.     carbidopa-levodopa (SINEMET IR) 25-100 MG tablet Take 1 tablet by mouth 4 (four) times daily. 0800, 1200, 1400, 2000 360 tablet 4   No current facility-administered medications for this visit.    Allergies as of 12/12/2021 - Review Complete 11/25/2021  Allergen Reaction Noted   Cyclobenzaprine  07/17/2021    Vitals: BP 117/66    Pulse 67    Ht 5\' 5"  (1.651 m)    Wt 190 lb 12.8 oz (86.5 kg)    BMI 31.75 kg/m  Last Weight:  Wt Readings from Last 1 Encounters:  12/12/21 190 lb 12.8 oz (86.5 kg)   Last Height:   Ht Readings from Last 1  Encounters:  12/12/21 5\' 5"  (1.651 m)   MMSE - Mini Mental State Exam 04/11/2021 09/13/2018 08/25/2017  Orientation to time 1 3 3   Orientation to Place 2 4 5   Registration 3 3 3   Attention/ Calculation 3 5 3   Recall 0 1 1  Language- name 2 objects 2 2 2   Language- repeat 0 0 0  Language- follow 3 step command 3 3 3   Language- read & follow direction 1 1 1   Write a sentence 1 0 1  Copy design 0 0 0  Total score 16 22 22      Patient is here today, hypomimia, pupils equally round reactive to light, blinks to threat, face is symmetric, reduced hearing bilaterally, sitting in wheelchair, increased tone right greater than left, bradykinesia, mild resting tremor with intention and postural components, cogwheeling in the uppers, upright while sitting in wheelchair, could not ambulate independently, cognition impaired.   MMSE - Mini Mental State Exam 04/11/2021 09/13/2018 08/25/2017  Orientation to time 1 3 3   Orientation to Place 2 4 5   Registration 3 3 3   Attention/ Calculation 3 5 3   Recall 0 1 1  Language- name 2 objects 2 2 2   Language- repeat 0 0 0  Language- follow 3 step command 3 3 3   Language- read & follow direction 1 1 1   Write a sentence 1 0 1  Copy design 0 0 0  Total score 16 22 22     Assessment/Plan:  80 year old wonderful male telephone follow up  of tremor, worsening penmanship, balance problems, falls, memory changes. Exam shows parkinsonian symptoms, shuffling and reduced arm swing, cogwheeling with facilitation, resting tremor(improved with sinemet). MRI of the brain unremarkable. Patient has been orthostatic in the office, Advised to drink more water. We discontinued Florinef because his blood pressure became too elevated.   Progression of PD: He is being moved to a 24x7 facility.  Dementia and PD progressed. Son here to day. He is going to [place] for long term care, [place] and being discharged to [facility] for long term. He can  use a walker. He has not been getting dizzy. He goes to PT, mostly bound to wheelchair. The aricept was taken off. No problems sleeping. He has hallucintaions and delusions that bother him.  Spoke to son about PD, UTIs, environmental factors, hospice, delusions, hallucinations, and answered all questions.   Parkinson's disease: Continue Sinemet. 1 pills four time a day (8am, 11am, 2pm and 5pm for example). Space 3-4 hours apart. Try to separate Sinemet from food (especially protein-rich foods like meat, dairy, eggs) by about 30-60 mins - this will help the absorption of the medication. If you have some nausea with the medication, you can take it with some light food like crackers or ginger ale. Power over PD group, information given. Document when he noticed the medication wearing off.    Dizziness: Orthostatic hypotension. IMPROVED WITH MORE WATER INTAKE and B12 injections. Denies dizziness today. Started drinking more fluids sparkling water and less pepsi. Improved.       Snoring, daytime somnolence, ESS 12: Sleep eval showed no clinically significant sleep apnea. 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 stable  dementia: Worsening slowly. 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 Aricept with diarrhea, stopped, they no longer wish him to be on it  Sarina Ill, MD  [facility] [address] [place], [place] [address]  Phone [phone] Fax [phone]  I spent over 45 minutes of face-to-face and non-face-to-face time with patient on the  1. Parkinson's disease ([place])   2. 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  This included previsit chart review, lab review, study review, order entry, electronic health record documentation,  patient education on the different diagnostic and therapeutic options, counseling and coordination of care, risks and benefits of management, compliance, or risk factor reduction

## 2021-12-17 DIAGNOSIS — Z9181 History of falling: Secondary | ICD-10-CM | POA: Diagnosis not present

## 2021-12-17 DIAGNOSIS — R471 Dysarthria and anarthria: Secondary | ICD-10-CM | POA: Diagnosis not present

## 2021-12-17 DIAGNOSIS — R2689 Other abnormalities of gait and mobility: Secondary | ICD-10-CM | POA: Diagnosis not present

## 2021-12-17 DIAGNOSIS — H9190 Unspecified hearing loss, unspecified ear: Secondary | ICD-10-CM | POA: Diagnosis not present

## 2021-12-17 DIAGNOSIS — G4752 REM sleep behavior disorder: Secondary | ICD-10-CM | POA: Diagnosis not present

## 2021-12-17 DIAGNOSIS — M6281 Muscle weakness (generalized): Secondary | ICD-10-CM | POA: Diagnosis not present

## 2021-12-17 DIAGNOSIS — G20 Parkinson's disease: Secondary | ICD-10-CM | POA: Diagnosis not present

## 2021-12-17 DIAGNOSIS — R296 Repeated falls: Secondary | ICD-10-CM | POA: Diagnosis not present

## 2021-12-17 DIAGNOSIS — R69 Illness, unspecified: Secondary | ICD-10-CM | POA: Diagnosis not present

## 2021-12-22 DIAGNOSIS — H9190 Unspecified hearing loss, unspecified ear: Secondary | ICD-10-CM | POA: Diagnosis not present

## 2021-12-22 DIAGNOSIS — Z9182 Personal history of military deployment: Secondary | ICD-10-CM | POA: Diagnosis not present

## 2021-12-22 DIAGNOSIS — F0282 Dementia in other diseases classified elsewhere, unspecified severity, with psychotic disturbance: Secondary | ICD-10-CM | POA: Diagnosis not present

## 2021-12-22 DIAGNOSIS — Z7982 Long term (current) use of aspirin: Secondary | ICD-10-CM | POA: Diagnosis not present

## 2021-12-22 DIAGNOSIS — G20 Parkinson's disease: Secondary | ICD-10-CM | POA: Diagnosis not present

## 2021-12-22 DIAGNOSIS — G4752 REM sleep behavior disorder: Secondary | ICD-10-CM | POA: Diagnosis not present

## 2021-12-22 DIAGNOSIS — R69 Illness, unspecified: Secondary | ICD-10-CM | POA: Diagnosis not present

## 2021-12-22 DIAGNOSIS — Z9181 History of falling: Secondary | ICD-10-CM | POA: Diagnosis not present

## 2021-12-22 DIAGNOSIS — M6281 Muscle weakness (generalized): Secondary | ICD-10-CM | POA: Diagnosis not present

## 2021-12-22 DIAGNOSIS — S2241XD Multiple fractures of ribs, right side, subsequent encounter for fracture with routine healing: Secondary | ICD-10-CM | POA: Diagnosis not present

## 2022-01-03 DIAGNOSIS — R69 Illness, unspecified: Secondary | ICD-10-CM | POA: Diagnosis not present

## 2022-01-03 DIAGNOSIS — F0282 Dementia in other diseases classified elsewhere, unspecified severity, with psychotic disturbance: Secondary | ICD-10-CM | POA: Diagnosis not present

## 2022-01-03 DIAGNOSIS — Z9181 History of falling: Secondary | ICD-10-CM | POA: Diagnosis not present

## 2022-01-03 DIAGNOSIS — G20 Parkinson's disease: Secondary | ICD-10-CM | POA: Diagnosis not present

## 2022-01-03 DIAGNOSIS — Z7982 Long term (current) use of aspirin: Secondary | ICD-10-CM | POA: Diagnosis not present

## 2022-01-03 DIAGNOSIS — G4752 REM sleep behavior disorder: Secondary | ICD-10-CM | POA: Diagnosis not present

## 2022-01-03 DIAGNOSIS — S2241XD Multiple fractures of ribs, right side, subsequent encounter for fracture with routine healing: Secondary | ICD-10-CM | POA: Diagnosis not present

## 2022-01-03 DIAGNOSIS — H9190 Unspecified hearing loss, unspecified ear: Secondary | ICD-10-CM | POA: Diagnosis not present

## 2022-01-07 DIAGNOSIS — G20 Parkinson's disease: Secondary | ICD-10-CM | POA: Diagnosis not present

## 2022-01-07 DIAGNOSIS — R69 Illness, unspecified: Secondary | ICD-10-CM | POA: Diagnosis not present

## 2022-01-07 DIAGNOSIS — Z7982 Long term (current) use of aspirin: Secondary | ICD-10-CM | POA: Diagnosis not present

## 2022-01-07 DIAGNOSIS — S2241XD Multiple fractures of ribs, right side, subsequent encounter for fracture with routine healing: Secondary | ICD-10-CM | POA: Diagnosis not present

## 2022-01-07 DIAGNOSIS — Z9181 History of falling: Secondary | ICD-10-CM | POA: Diagnosis not present

## 2022-01-07 DIAGNOSIS — H9190 Unspecified hearing loss, unspecified ear: Secondary | ICD-10-CM | POA: Diagnosis not present

## 2022-01-07 DIAGNOSIS — F0282 Dementia in other diseases classified elsewhere, unspecified severity, with psychotic disturbance: Secondary | ICD-10-CM | POA: Diagnosis not present

## 2022-01-07 DIAGNOSIS — G4752 REM sleep behavior disorder: Secondary | ICD-10-CM | POA: Diagnosis not present

## 2022-01-10 DIAGNOSIS — F0282 Dementia in other diseases classified elsewhere, unspecified severity, with psychotic disturbance: Secondary | ICD-10-CM | POA: Diagnosis not present

## 2022-01-10 DIAGNOSIS — G20 Parkinson's disease: Secondary | ICD-10-CM | POA: Diagnosis not present

## 2022-01-10 DIAGNOSIS — Z7982 Long term (current) use of aspirin: Secondary | ICD-10-CM | POA: Diagnosis not present

## 2022-01-10 DIAGNOSIS — R69 Illness, unspecified: Secondary | ICD-10-CM | POA: Diagnosis not present

## 2022-01-10 DIAGNOSIS — S2241XD Multiple fractures of ribs, right side, subsequent encounter for fracture with routine healing: Secondary | ICD-10-CM | POA: Diagnosis not present

## 2022-01-10 DIAGNOSIS — H9190 Unspecified hearing loss, unspecified ear: Secondary | ICD-10-CM | POA: Diagnosis not present

## 2022-01-10 DIAGNOSIS — Z9181 History of falling: Secondary | ICD-10-CM | POA: Diagnosis not present

## 2022-01-10 DIAGNOSIS — G4752 REM sleep behavior disorder: Secondary | ICD-10-CM | POA: Diagnosis not present

## 2022-01-13 DIAGNOSIS — F0282 Dementia in other diseases classified elsewhere, unspecified severity, with psychotic disturbance: Secondary | ICD-10-CM | POA: Diagnosis not present

## 2022-01-13 DIAGNOSIS — G4752 REM sleep behavior disorder: Secondary | ICD-10-CM | POA: Diagnosis not present

## 2022-01-13 DIAGNOSIS — G20 Parkinson's disease: Secondary | ICD-10-CM | POA: Diagnosis not present

## 2022-01-13 DIAGNOSIS — R69 Illness, unspecified: Secondary | ICD-10-CM | POA: Diagnosis not present

## 2022-01-13 DIAGNOSIS — Z9181 History of falling: Secondary | ICD-10-CM | POA: Diagnosis not present

## 2022-01-13 DIAGNOSIS — S2241XD Multiple fractures of ribs, right side, subsequent encounter for fracture with routine healing: Secondary | ICD-10-CM | POA: Diagnosis not present

## 2022-01-13 DIAGNOSIS — Z7982 Long term (current) use of aspirin: Secondary | ICD-10-CM | POA: Diagnosis not present

## 2022-01-13 DIAGNOSIS — H9190 Unspecified hearing loss, unspecified ear: Secondary | ICD-10-CM | POA: Diagnosis not present

## 2022-01-14 DIAGNOSIS — R69 Illness, unspecified: Secondary | ICD-10-CM | POA: Diagnosis not present

## 2022-01-14 DIAGNOSIS — H9190 Unspecified hearing loss, unspecified ear: Secondary | ICD-10-CM | POA: Diagnosis not present

## 2022-01-14 DIAGNOSIS — G20 Parkinson's disease: Secondary | ICD-10-CM | POA: Diagnosis not present

## 2022-01-14 DIAGNOSIS — Z7982 Long term (current) use of aspirin: Secondary | ICD-10-CM | POA: Diagnosis not present

## 2022-01-14 DIAGNOSIS — G4752 REM sleep behavior disorder: Secondary | ICD-10-CM | POA: Diagnosis not present

## 2022-01-14 DIAGNOSIS — S2241XD Multiple fractures of ribs, right side, subsequent encounter for fracture with routine healing: Secondary | ICD-10-CM | POA: Diagnosis not present

## 2022-01-14 DIAGNOSIS — Z9181 History of falling: Secondary | ICD-10-CM | POA: Diagnosis not present

## 2022-01-14 DIAGNOSIS — F0282 Dementia in other diseases classified elsewhere, unspecified severity, with psychotic disturbance: Secondary | ICD-10-CM | POA: Diagnosis not present

## 2022-01-17 DIAGNOSIS — H9190 Unspecified hearing loss, unspecified ear: Secondary | ICD-10-CM | POA: Diagnosis not present

## 2022-01-17 DIAGNOSIS — G20 Parkinson's disease: Secondary | ICD-10-CM | POA: Diagnosis not present

## 2022-01-17 DIAGNOSIS — R2689 Other abnormalities of gait and mobility: Secondary | ICD-10-CM | POA: Diagnosis not present

## 2022-01-17 DIAGNOSIS — R69 Illness, unspecified: Secondary | ICD-10-CM | POA: Diagnosis not present

## 2022-01-17 DIAGNOSIS — Z9181 History of falling: Secondary | ICD-10-CM | POA: Diagnosis not present

## 2022-01-17 DIAGNOSIS — R296 Repeated falls: Secondary | ICD-10-CM | POA: Diagnosis not present

## 2022-01-17 DIAGNOSIS — F0282 Dementia in other diseases classified elsewhere, unspecified severity, with psychotic disturbance: Secondary | ICD-10-CM | POA: Diagnosis not present

## 2022-01-17 DIAGNOSIS — Z7982 Long term (current) use of aspirin: Secondary | ICD-10-CM | POA: Diagnosis not present

## 2022-01-17 DIAGNOSIS — M6281 Muscle weakness (generalized): Secondary | ICD-10-CM | POA: Diagnosis not present

## 2022-01-17 DIAGNOSIS — S2241XD Multiple fractures of ribs, right side, subsequent encounter for fracture with routine healing: Secondary | ICD-10-CM | POA: Diagnosis not present

## 2022-01-17 DIAGNOSIS — G4752 REM sleep behavior disorder: Secondary | ICD-10-CM | POA: Diagnosis not present

## 2022-01-20 ENCOUNTER — Telehealth: Payer: Self-pay | Admitting: Neurology

## 2022-01-20 DIAGNOSIS — Z7982 Long term (current) use of aspirin: Secondary | ICD-10-CM | POA: Diagnosis not present

## 2022-01-20 DIAGNOSIS — F0282 Dementia in other diseases classified elsewhere, unspecified severity, with psychotic disturbance: Secondary | ICD-10-CM | POA: Diagnosis not present

## 2022-01-20 DIAGNOSIS — G4752 REM sleep behavior disorder: Secondary | ICD-10-CM | POA: Diagnosis not present

## 2022-01-20 DIAGNOSIS — H9190 Unspecified hearing loss, unspecified ear: Secondary | ICD-10-CM | POA: Diagnosis not present

## 2022-01-20 DIAGNOSIS — G20 Parkinson's disease: Secondary | ICD-10-CM | POA: Diagnosis not present

## 2022-01-20 DIAGNOSIS — S2241XD Multiple fractures of ribs, right side, subsequent encounter for fracture with routine healing: Secondary | ICD-10-CM | POA: Diagnosis not present

## 2022-01-20 DIAGNOSIS — Z9181 History of falling: Secondary | ICD-10-CM | POA: Diagnosis not present

## 2022-01-20 DIAGNOSIS — R69 Illness, unspecified: Secondary | ICD-10-CM | POA: Diagnosis not present

## 2022-01-20 NOTE — Telephone Encounter (Signed)
Pt's wife wants to know why an appt is scheduled for Wednesday 2/8, said she thought that the Dr. Was done with him and would like to have a CB explaining this before Wednesday.

## 2022-01-20 NOTE — Telephone Encounter (Signed)
This is a duplicate call. See other phone note.

## 2022-01-20 NOTE — Telephone Encounter (Signed)
This appt was scheduled back in August 2022 as a 6 month f/u. Pt was seen in December, return if symptoms worsen or fail to improve. I called the wife. She does not feel this appt is needed. I canceled the appt. She was very appreciative for the call back.

## 2022-01-20 NOTE — Telephone Encounter (Signed)
Pt's wife wants to know why he has an appt on Wednesday 2/8, she said she thought the dr was done with him and needs this explained before the appt Wednesday.

## 2022-01-21 DIAGNOSIS — Z9181 History of falling: Secondary | ICD-10-CM | POA: Diagnosis not present

## 2022-01-21 DIAGNOSIS — Z7982 Long term (current) use of aspirin: Secondary | ICD-10-CM | POA: Diagnosis not present

## 2022-01-21 DIAGNOSIS — S2241XD Multiple fractures of ribs, right side, subsequent encounter for fracture with routine healing: Secondary | ICD-10-CM | POA: Diagnosis not present

## 2022-01-21 DIAGNOSIS — F0282 Dementia in other diseases classified elsewhere, unspecified severity, with psychotic disturbance: Secondary | ICD-10-CM | POA: Diagnosis not present

## 2022-01-21 DIAGNOSIS — H9190 Unspecified hearing loss, unspecified ear: Secondary | ICD-10-CM | POA: Diagnosis not present

## 2022-01-21 DIAGNOSIS — R69 Illness, unspecified: Secondary | ICD-10-CM | POA: Diagnosis not present

## 2022-01-21 DIAGNOSIS — G20 Parkinson's disease: Secondary | ICD-10-CM | POA: Diagnosis not present

## 2022-01-21 DIAGNOSIS — G4752 REM sleep behavior disorder: Secondary | ICD-10-CM | POA: Diagnosis not present

## 2022-01-22 ENCOUNTER — Ambulatory Visit: Payer: Self-pay | Admitting: Neurology

## 2022-01-23 DIAGNOSIS — G4752 REM sleep behavior disorder: Secondary | ICD-10-CM | POA: Diagnosis not present

## 2022-01-23 DIAGNOSIS — Z9181 History of falling: Secondary | ICD-10-CM | POA: Diagnosis not present

## 2022-01-23 DIAGNOSIS — G20 Parkinson's disease: Secondary | ICD-10-CM | POA: Diagnosis not present

## 2022-01-23 DIAGNOSIS — Z7982 Long term (current) use of aspirin: Secondary | ICD-10-CM | POA: Diagnosis not present

## 2022-01-23 DIAGNOSIS — R69 Illness, unspecified: Secondary | ICD-10-CM | POA: Diagnosis not present

## 2022-01-23 DIAGNOSIS — F0282 Dementia in other diseases classified elsewhere, unspecified severity, with psychotic disturbance: Secondary | ICD-10-CM | POA: Diagnosis not present

## 2022-01-23 DIAGNOSIS — S2241XD Multiple fractures of ribs, right side, subsequent encounter for fracture with routine healing: Secondary | ICD-10-CM | POA: Diagnosis not present

## 2022-01-23 DIAGNOSIS — H9190 Unspecified hearing loss, unspecified ear: Secondary | ICD-10-CM | POA: Diagnosis not present

## 2022-01-24 DIAGNOSIS — S2241XD Multiple fractures of ribs, right side, subsequent encounter for fracture with routine healing: Secondary | ICD-10-CM | POA: Diagnosis not present

## 2022-01-24 DIAGNOSIS — G4752 REM sleep behavior disorder: Secondary | ICD-10-CM | POA: Diagnosis not present

## 2022-01-24 DIAGNOSIS — G20 Parkinson's disease: Secondary | ICD-10-CM | POA: Diagnosis not present

## 2022-01-24 DIAGNOSIS — R69 Illness, unspecified: Secondary | ICD-10-CM | POA: Diagnosis not present

## 2022-01-24 DIAGNOSIS — F0282 Dementia in other diseases classified elsewhere, unspecified severity, with psychotic disturbance: Secondary | ICD-10-CM | POA: Diagnosis not present

## 2022-01-24 DIAGNOSIS — Z7982 Long term (current) use of aspirin: Secondary | ICD-10-CM | POA: Diagnosis not present

## 2022-01-24 DIAGNOSIS — Z9181 History of falling: Secondary | ICD-10-CM | POA: Diagnosis not present

## 2022-01-24 DIAGNOSIS — H9190 Unspecified hearing loss, unspecified ear: Secondary | ICD-10-CM | POA: Diagnosis not present

## 2022-01-27 DIAGNOSIS — G4752 REM sleep behavior disorder: Secondary | ICD-10-CM | POA: Diagnosis not present

## 2022-01-27 DIAGNOSIS — F0282 Dementia in other diseases classified elsewhere, unspecified severity, with psychotic disturbance: Secondary | ICD-10-CM | POA: Diagnosis not present

## 2022-01-27 DIAGNOSIS — R69 Illness, unspecified: Secondary | ICD-10-CM | POA: Diagnosis not present

## 2022-01-27 DIAGNOSIS — S2241XD Multiple fractures of ribs, right side, subsequent encounter for fracture with routine healing: Secondary | ICD-10-CM | POA: Diagnosis not present

## 2022-01-27 DIAGNOSIS — G20 Parkinson's disease: Secondary | ICD-10-CM | POA: Diagnosis not present

## 2022-01-27 DIAGNOSIS — Z9181 History of falling: Secondary | ICD-10-CM | POA: Diagnosis not present

## 2022-01-27 DIAGNOSIS — Z7982 Long term (current) use of aspirin: Secondary | ICD-10-CM | POA: Diagnosis not present

## 2022-01-27 DIAGNOSIS — H9190 Unspecified hearing loss, unspecified ear: Secondary | ICD-10-CM | POA: Diagnosis not present

## 2022-01-28 DIAGNOSIS — G20 Parkinson's disease: Secondary | ICD-10-CM | POA: Diagnosis not present

## 2022-01-28 DIAGNOSIS — H9190 Unspecified hearing loss, unspecified ear: Secondary | ICD-10-CM | POA: Diagnosis not present

## 2022-01-28 DIAGNOSIS — Z7982 Long term (current) use of aspirin: Secondary | ICD-10-CM | POA: Diagnosis not present

## 2022-01-28 DIAGNOSIS — F0282 Dementia in other diseases classified elsewhere, unspecified severity, with psychotic disturbance: Secondary | ICD-10-CM | POA: Diagnosis not present

## 2022-01-28 DIAGNOSIS — R69 Illness, unspecified: Secondary | ICD-10-CM | POA: Diagnosis not present

## 2022-01-28 DIAGNOSIS — G4752 REM sleep behavior disorder: Secondary | ICD-10-CM | POA: Diagnosis not present

## 2022-01-28 DIAGNOSIS — Z9181 History of falling: Secondary | ICD-10-CM | POA: Diagnosis not present

## 2022-01-28 DIAGNOSIS — S2241XD Multiple fractures of ribs, right side, subsequent encounter for fracture with routine healing: Secondary | ICD-10-CM | POA: Diagnosis not present

## 2022-01-31 DIAGNOSIS — R296 Repeated falls: Secondary | ICD-10-CM | POA: Diagnosis not present

## 2022-01-31 DIAGNOSIS — G20 Parkinson's disease: Secondary | ICD-10-CM | POA: Diagnosis not present

## 2022-01-31 DIAGNOSIS — M25552 Pain in left hip: Secondary | ICD-10-CM | POA: Diagnosis not present

## 2022-02-01 DIAGNOSIS — M25552 Pain in left hip: Secondary | ICD-10-CM | POA: Diagnosis not present

## 2022-02-03 DIAGNOSIS — G20 Parkinson's disease: Secondary | ICD-10-CM | POA: Diagnosis not present

## 2022-02-03 DIAGNOSIS — R69 Illness, unspecified: Secondary | ICD-10-CM | POA: Diagnosis not present

## 2022-02-03 DIAGNOSIS — R296 Repeated falls: Secondary | ICD-10-CM | POA: Diagnosis not present

## 2022-02-03 DIAGNOSIS — M1651 Unilateral post-traumatic osteoarthritis, right hip: Secondary | ICD-10-CM | POA: Diagnosis not present

## 2022-02-03 DIAGNOSIS — M6281 Muscle weakness (generalized): Secondary | ICD-10-CM | POA: Diagnosis not present

## 2022-02-07 DIAGNOSIS — G20 Parkinson's disease: Secondary | ICD-10-CM | POA: Diagnosis not present

## 2022-02-07 DIAGNOSIS — H9190 Unspecified hearing loss, unspecified ear: Secondary | ICD-10-CM | POA: Diagnosis not present

## 2022-02-07 DIAGNOSIS — G4752 REM sleep behavior disorder: Secondary | ICD-10-CM | POA: Diagnosis not present

## 2022-02-07 DIAGNOSIS — F0282 Dementia in other diseases classified elsewhere, unspecified severity, with psychotic disturbance: Secondary | ICD-10-CM | POA: Diagnosis not present

## 2022-02-07 DIAGNOSIS — S2241XD Multiple fractures of ribs, right side, subsequent encounter for fracture with routine healing: Secondary | ICD-10-CM | POA: Diagnosis not present

## 2022-02-07 DIAGNOSIS — Z7982 Long term (current) use of aspirin: Secondary | ICD-10-CM | POA: Diagnosis not present

## 2022-02-07 DIAGNOSIS — Z9181 History of falling: Secondary | ICD-10-CM | POA: Diagnosis not present

## 2022-02-07 DIAGNOSIS — R69 Illness, unspecified: Secondary | ICD-10-CM | POA: Diagnosis not present

## 2022-02-10 DIAGNOSIS — G20 Parkinson's disease: Secondary | ICD-10-CM | POA: Diagnosis not present

## 2022-02-10 DIAGNOSIS — F0282 Dementia in other diseases classified elsewhere, unspecified severity, with psychotic disturbance: Secondary | ICD-10-CM | POA: Diagnosis not present

## 2022-02-10 DIAGNOSIS — G4752 REM sleep behavior disorder: Secondary | ICD-10-CM | POA: Diagnosis not present

## 2022-02-10 DIAGNOSIS — Z7982 Long term (current) use of aspirin: Secondary | ICD-10-CM | POA: Diagnosis not present

## 2022-02-10 DIAGNOSIS — R296 Repeated falls: Secondary | ICD-10-CM | POA: Diagnosis not present

## 2022-02-10 DIAGNOSIS — H9190 Unspecified hearing loss, unspecified ear: Secondary | ICD-10-CM | POA: Diagnosis not present

## 2022-02-10 DIAGNOSIS — Z9181 History of falling: Secondary | ICD-10-CM | POA: Diagnosis not present

## 2022-02-10 DIAGNOSIS — L22 Diaper dermatitis: Secondary | ICD-10-CM | POA: Diagnosis not present

## 2022-02-10 DIAGNOSIS — S2241XD Multiple fractures of ribs, right side, subsequent encounter for fracture with routine healing: Secondary | ICD-10-CM | POA: Diagnosis not present

## 2022-02-10 DIAGNOSIS — R69 Illness, unspecified: Secondary | ICD-10-CM | POA: Diagnosis not present

## 2022-02-10 DIAGNOSIS — M6281 Muscle weakness (generalized): Secondary | ICD-10-CM | POA: Diagnosis not present

## 2022-02-11 DIAGNOSIS — Z9181 History of falling: Secondary | ICD-10-CM | POA: Diagnosis not present

## 2022-02-11 DIAGNOSIS — S2241XD Multiple fractures of ribs, right side, subsequent encounter for fracture with routine healing: Secondary | ICD-10-CM | POA: Diagnosis not present

## 2022-02-11 DIAGNOSIS — Z7982 Long term (current) use of aspirin: Secondary | ICD-10-CM | POA: Diagnosis not present

## 2022-02-11 DIAGNOSIS — G4752 REM sleep behavior disorder: Secondary | ICD-10-CM | POA: Diagnosis not present

## 2022-02-11 DIAGNOSIS — G20 Parkinson's disease: Secondary | ICD-10-CM | POA: Diagnosis not present

## 2022-02-11 DIAGNOSIS — F0282 Dementia in other diseases classified elsewhere, unspecified severity, with psychotic disturbance: Secondary | ICD-10-CM | POA: Diagnosis not present

## 2022-02-11 DIAGNOSIS — R69 Illness, unspecified: Secondary | ICD-10-CM | POA: Diagnosis not present

## 2022-02-11 DIAGNOSIS — H9190 Unspecified hearing loss, unspecified ear: Secondary | ICD-10-CM | POA: Diagnosis not present

## 2022-02-12 DIAGNOSIS — F0282 Dementia in other diseases classified elsewhere, unspecified severity, with psychotic disturbance: Secondary | ICD-10-CM | POA: Diagnosis not present

## 2022-02-12 DIAGNOSIS — H9190 Unspecified hearing loss, unspecified ear: Secondary | ICD-10-CM | POA: Diagnosis not present

## 2022-02-12 DIAGNOSIS — G4752 REM sleep behavior disorder: Secondary | ICD-10-CM | POA: Diagnosis not present

## 2022-02-12 DIAGNOSIS — Z7982 Long term (current) use of aspirin: Secondary | ICD-10-CM | POA: Diagnosis not present

## 2022-02-12 DIAGNOSIS — S2241XD Multiple fractures of ribs, right side, subsequent encounter for fracture with routine healing: Secondary | ICD-10-CM | POA: Diagnosis not present

## 2022-02-12 DIAGNOSIS — R69 Illness, unspecified: Secondary | ICD-10-CM | POA: Diagnosis not present

## 2022-02-12 DIAGNOSIS — Z9181 History of falling: Secondary | ICD-10-CM | POA: Diagnosis not present

## 2022-02-12 DIAGNOSIS — G20 Parkinson's disease: Secondary | ICD-10-CM | POA: Diagnosis not present

## 2022-02-13 DIAGNOSIS — R4182 Altered mental status, unspecified: Secondary | ICD-10-CM | POA: Diagnosis not present

## 2022-02-13 DIAGNOSIS — N390 Urinary tract infection, site not specified: Secondary | ICD-10-CM | POA: Diagnosis not present

## 2022-02-14 DIAGNOSIS — R296 Repeated falls: Secondary | ICD-10-CM | POA: Diagnosis not present

## 2022-02-14 DIAGNOSIS — M6281 Muscle weakness (generalized): Secondary | ICD-10-CM | POA: Diagnosis not present

## 2022-02-14 DIAGNOSIS — G20 Parkinson's disease: Secondary | ICD-10-CM | POA: Diagnosis not present

## 2022-02-14 DIAGNOSIS — R2689 Other abnormalities of gait and mobility: Secondary | ICD-10-CM | POA: Diagnosis not present

## 2022-02-17 DIAGNOSIS — F0282 Dementia in other diseases classified elsewhere, unspecified severity, with psychotic disturbance: Secondary | ICD-10-CM | POA: Diagnosis not present

## 2022-02-17 DIAGNOSIS — G4752 REM sleep behavior disorder: Secondary | ICD-10-CM | POA: Diagnosis not present

## 2022-02-17 DIAGNOSIS — S2241XD Multiple fractures of ribs, right side, subsequent encounter for fracture with routine healing: Secondary | ICD-10-CM | POA: Diagnosis not present

## 2022-02-17 DIAGNOSIS — R69 Illness, unspecified: Secondary | ICD-10-CM | POA: Diagnosis not present

## 2022-02-17 DIAGNOSIS — Z7982 Long term (current) use of aspirin: Secondary | ICD-10-CM | POA: Diagnosis not present

## 2022-02-17 DIAGNOSIS — G20 Parkinson's disease: Secondary | ICD-10-CM | POA: Diagnosis not present

## 2022-02-17 DIAGNOSIS — H9190 Unspecified hearing loss, unspecified ear: Secondary | ICD-10-CM | POA: Diagnosis not present

## 2022-02-17 DIAGNOSIS — Z9181 History of falling: Secondary | ICD-10-CM | POA: Diagnosis not present

## 2022-02-18 DIAGNOSIS — H9190 Unspecified hearing loss, unspecified ear: Secondary | ICD-10-CM | POA: Diagnosis not present

## 2022-02-18 DIAGNOSIS — G4752 REM sleep behavior disorder: Secondary | ICD-10-CM | POA: Diagnosis not present

## 2022-02-18 DIAGNOSIS — F0282 Dementia in other diseases classified elsewhere, unspecified severity, with psychotic disturbance: Secondary | ICD-10-CM | POA: Diagnosis not present

## 2022-02-18 DIAGNOSIS — G20 Parkinson's disease: Secondary | ICD-10-CM | POA: Diagnosis not present

## 2022-02-18 DIAGNOSIS — S2241XD Multiple fractures of ribs, right side, subsequent encounter for fracture with routine healing: Secondary | ICD-10-CM | POA: Diagnosis not present

## 2022-02-18 DIAGNOSIS — Z9181 History of falling: Secondary | ICD-10-CM | POA: Diagnosis not present

## 2022-02-18 DIAGNOSIS — Z7982 Long term (current) use of aspirin: Secondary | ICD-10-CM | POA: Diagnosis not present

## 2022-02-18 DIAGNOSIS — R69 Illness, unspecified: Secondary | ICD-10-CM | POA: Diagnosis not present

## 2022-02-19 DIAGNOSIS — H9190 Unspecified hearing loss, unspecified ear: Secondary | ICD-10-CM | POA: Diagnosis not present

## 2022-02-19 DIAGNOSIS — R69 Illness, unspecified: Secondary | ICD-10-CM | POA: Diagnosis not present

## 2022-02-19 DIAGNOSIS — G20 Parkinson's disease: Secondary | ICD-10-CM | POA: Diagnosis not present

## 2022-02-19 DIAGNOSIS — G4752 REM sleep behavior disorder: Secondary | ICD-10-CM | POA: Diagnosis not present

## 2022-02-19 DIAGNOSIS — F0282 Dementia in other diseases classified elsewhere, unspecified severity, with psychotic disturbance: Secondary | ICD-10-CM | POA: Diagnosis not present

## 2022-02-19 DIAGNOSIS — Z7982 Long term (current) use of aspirin: Secondary | ICD-10-CM | POA: Diagnosis not present

## 2022-02-19 DIAGNOSIS — Z9181 History of falling: Secondary | ICD-10-CM | POA: Diagnosis not present

## 2022-02-19 DIAGNOSIS — S2241XD Multiple fractures of ribs, right side, subsequent encounter for fracture with routine healing: Secondary | ICD-10-CM | POA: Diagnosis not present

## 2022-02-27 DIAGNOSIS — S2241XD Multiple fractures of ribs, right side, subsequent encounter for fracture with routine healing: Secondary | ICD-10-CM | POA: Diagnosis not present

## 2022-02-27 DIAGNOSIS — Z7982 Long term (current) use of aspirin: Secondary | ICD-10-CM | POA: Diagnosis not present

## 2022-02-27 DIAGNOSIS — G20 Parkinson's disease: Secondary | ICD-10-CM | POA: Diagnosis not present

## 2022-02-27 DIAGNOSIS — M6281 Muscle weakness (generalized): Secondary | ICD-10-CM | POA: Diagnosis not present

## 2022-02-27 DIAGNOSIS — H9190 Unspecified hearing loss, unspecified ear: Secondary | ICD-10-CM | POA: Diagnosis not present

## 2022-02-27 DIAGNOSIS — Z9181 History of falling: Secondary | ICD-10-CM | POA: Diagnosis not present

## 2022-02-27 DIAGNOSIS — Z9182 Personal history of military deployment: Secondary | ICD-10-CM | POA: Diagnosis not present

## 2022-02-27 DIAGNOSIS — G4752 REM sleep behavior disorder: Secondary | ICD-10-CM | POA: Diagnosis not present

## 2022-02-27 DIAGNOSIS — R69 Illness, unspecified: Secondary | ICD-10-CM | POA: Diagnosis not present

## 2022-03-03 DIAGNOSIS — H9190 Unspecified hearing loss, unspecified ear: Secondary | ICD-10-CM | POA: Diagnosis not present

## 2022-03-03 DIAGNOSIS — R69 Illness, unspecified: Secondary | ICD-10-CM | POA: Diagnosis not present

## 2022-03-03 DIAGNOSIS — S2241XD Multiple fractures of ribs, right side, subsequent encounter for fracture with routine healing: Secondary | ICD-10-CM | POA: Diagnosis not present

## 2022-03-03 DIAGNOSIS — M6281 Muscle weakness (generalized): Secondary | ICD-10-CM | POA: Diagnosis not present

## 2022-03-03 DIAGNOSIS — Z7982 Long term (current) use of aspirin: Secondary | ICD-10-CM | POA: Diagnosis not present

## 2022-03-03 DIAGNOSIS — Z9181 History of falling: Secondary | ICD-10-CM | POA: Diagnosis not present

## 2022-03-03 DIAGNOSIS — G4752 REM sleep behavior disorder: Secondary | ICD-10-CM | POA: Diagnosis not present

## 2022-03-03 DIAGNOSIS — G20 Parkinson's disease: Secondary | ICD-10-CM | POA: Diagnosis not present

## 2022-03-03 DIAGNOSIS — Z9182 Personal history of military deployment: Secondary | ICD-10-CM | POA: Diagnosis not present

## 2022-03-07 DIAGNOSIS — Z9182 Personal history of military deployment: Secondary | ICD-10-CM | POA: Diagnosis not present

## 2022-03-07 DIAGNOSIS — S2241XD Multiple fractures of ribs, right side, subsequent encounter for fracture with routine healing: Secondary | ICD-10-CM | POA: Diagnosis not present

## 2022-03-07 DIAGNOSIS — H9190 Unspecified hearing loss, unspecified ear: Secondary | ICD-10-CM | POA: Diagnosis not present

## 2022-03-07 DIAGNOSIS — R69 Illness, unspecified: Secondary | ICD-10-CM | POA: Diagnosis not present

## 2022-03-07 DIAGNOSIS — Z9181 History of falling: Secondary | ICD-10-CM | POA: Diagnosis not present

## 2022-03-07 DIAGNOSIS — Z7982 Long term (current) use of aspirin: Secondary | ICD-10-CM | POA: Diagnosis not present

## 2022-03-07 DIAGNOSIS — M6281 Muscle weakness (generalized): Secondary | ICD-10-CM | POA: Diagnosis not present

## 2022-03-07 DIAGNOSIS — G4752 REM sleep behavior disorder: Secondary | ICD-10-CM | POA: Diagnosis not present

## 2022-03-07 DIAGNOSIS — G20 Parkinson's disease: Secondary | ICD-10-CM | POA: Diagnosis not present

## 2022-03-10 DIAGNOSIS — R69 Illness, unspecified: Secondary | ICD-10-CM | POA: Diagnosis not present

## 2022-03-10 DIAGNOSIS — H9190 Unspecified hearing loss, unspecified ear: Secondary | ICD-10-CM | POA: Diagnosis not present

## 2022-03-10 DIAGNOSIS — Z9181 History of falling: Secondary | ICD-10-CM | POA: Diagnosis not present

## 2022-03-10 DIAGNOSIS — M6281 Muscle weakness (generalized): Secondary | ICD-10-CM | POA: Diagnosis not present

## 2022-03-10 DIAGNOSIS — Z9182 Personal history of military deployment: Secondary | ICD-10-CM | POA: Diagnosis not present

## 2022-03-10 DIAGNOSIS — G4752 REM sleep behavior disorder: Secondary | ICD-10-CM | POA: Diagnosis not present

## 2022-03-10 DIAGNOSIS — S2241XD Multiple fractures of ribs, right side, subsequent encounter for fracture with routine healing: Secondary | ICD-10-CM | POA: Diagnosis not present

## 2022-03-10 DIAGNOSIS — G20 Parkinson's disease: Secondary | ICD-10-CM | POA: Diagnosis not present

## 2022-03-10 DIAGNOSIS — Z7982 Long term (current) use of aspirin: Secondary | ICD-10-CM | POA: Diagnosis not present

## 2022-03-13 DIAGNOSIS — R69 Illness, unspecified: Secondary | ICD-10-CM | POA: Diagnosis not present

## 2022-03-13 DIAGNOSIS — G4752 REM sleep behavior disorder: Secondary | ICD-10-CM | POA: Diagnosis not present

## 2022-03-13 DIAGNOSIS — Z9181 History of falling: Secondary | ICD-10-CM | POA: Diagnosis not present

## 2022-03-13 DIAGNOSIS — S2241XD Multiple fractures of ribs, right side, subsequent encounter for fracture with routine healing: Secondary | ICD-10-CM | POA: Diagnosis not present

## 2022-03-13 DIAGNOSIS — H9190 Unspecified hearing loss, unspecified ear: Secondary | ICD-10-CM | POA: Diagnosis not present

## 2022-03-13 DIAGNOSIS — M6281 Muscle weakness (generalized): Secondary | ICD-10-CM | POA: Diagnosis not present

## 2022-03-13 DIAGNOSIS — G20 Parkinson's disease: Secondary | ICD-10-CM | POA: Diagnosis not present

## 2022-03-13 DIAGNOSIS — Z7982 Long term (current) use of aspirin: Secondary | ICD-10-CM | POA: Diagnosis not present

## 2022-03-13 DIAGNOSIS — Z9182 Personal history of military deployment: Secondary | ICD-10-CM | POA: Diagnosis not present

## 2022-03-14 DIAGNOSIS — G20 Parkinson's disease: Secondary | ICD-10-CM | POA: Diagnosis not present

## 2022-03-14 DIAGNOSIS — Z9181 History of falling: Secondary | ICD-10-CM | POA: Diagnosis not present

## 2022-03-14 DIAGNOSIS — H9190 Unspecified hearing loss, unspecified ear: Secondary | ICD-10-CM | POA: Diagnosis not present

## 2022-03-14 DIAGNOSIS — Z9182 Personal history of military deployment: Secondary | ICD-10-CM | POA: Diagnosis not present

## 2022-03-14 DIAGNOSIS — S2241XD Multiple fractures of ribs, right side, subsequent encounter for fracture with routine healing: Secondary | ICD-10-CM | POA: Diagnosis not present

## 2022-03-14 DIAGNOSIS — R69 Illness, unspecified: Secondary | ICD-10-CM | POA: Diagnosis not present

## 2022-03-14 DIAGNOSIS — Z7982 Long term (current) use of aspirin: Secondary | ICD-10-CM | POA: Diagnosis not present

## 2022-03-14 DIAGNOSIS — G4752 REM sleep behavior disorder: Secondary | ICD-10-CM | POA: Diagnosis not present

## 2022-03-14 DIAGNOSIS — M6281 Muscle weakness (generalized): Secondary | ICD-10-CM | POA: Diagnosis not present

## 2022-03-17 DIAGNOSIS — R296 Repeated falls: Secondary | ICD-10-CM | POA: Diagnosis not present

## 2022-03-17 DIAGNOSIS — Z9181 History of falling: Secondary | ICD-10-CM | POA: Diagnosis not present

## 2022-03-17 DIAGNOSIS — G20 Parkinson's disease: Secondary | ICD-10-CM | POA: Diagnosis not present

## 2022-03-17 DIAGNOSIS — R2689 Other abnormalities of gait and mobility: Secondary | ICD-10-CM | POA: Diagnosis not present

## 2022-03-17 DIAGNOSIS — R69 Illness, unspecified: Secondary | ICD-10-CM | POA: Diagnosis not present

## 2022-03-17 DIAGNOSIS — H9190 Unspecified hearing loss, unspecified ear: Secondary | ICD-10-CM | POA: Diagnosis not present

## 2022-03-17 DIAGNOSIS — M25552 Pain in left hip: Secondary | ICD-10-CM | POA: Diagnosis not present

## 2022-03-17 DIAGNOSIS — S2241XD Multiple fractures of ribs, right side, subsequent encounter for fracture with routine healing: Secondary | ICD-10-CM | POA: Diagnosis not present

## 2022-03-17 DIAGNOSIS — M6281 Muscle weakness (generalized): Secondary | ICD-10-CM | POA: Diagnosis not present

## 2022-03-17 DIAGNOSIS — Z9182 Personal history of military deployment: Secondary | ICD-10-CM | POA: Diagnosis not present

## 2022-03-17 DIAGNOSIS — R059 Cough, unspecified: Secondary | ICD-10-CM | POA: Diagnosis not present

## 2022-03-17 DIAGNOSIS — Z7982 Long term (current) use of aspirin: Secondary | ICD-10-CM | POA: Diagnosis not present

## 2022-03-17 DIAGNOSIS — G4752 REM sleep behavior disorder: Secondary | ICD-10-CM | POA: Diagnosis not present

## 2022-03-18 ENCOUNTER — Emergency Department (HOSPITAL_BASED_OUTPATIENT_CLINIC_OR_DEPARTMENT_OTHER): Payer: Medicare HMO | Admitting: Radiology

## 2022-03-18 ENCOUNTER — Encounter (HOSPITAL_BASED_OUTPATIENT_CLINIC_OR_DEPARTMENT_OTHER): Payer: Self-pay | Admitting: *Deleted

## 2022-03-18 ENCOUNTER — Emergency Department (HOSPITAL_BASED_OUTPATIENT_CLINIC_OR_DEPARTMENT_OTHER): Payer: Medicare HMO

## 2022-03-18 ENCOUNTER — Observation Stay (HOSPITAL_BASED_OUTPATIENT_CLINIC_OR_DEPARTMENT_OTHER)
Admission: EM | Admit: 2022-03-18 | Discharge: 2022-03-20 | Disposition: A | Discharge status: Home / Self Care | Payer: Medicare HMO | Attending: Internal Medicine | Admitting: Internal Medicine

## 2022-03-18 ENCOUNTER — Other Ambulatory Visit: Payer: Self-pay

## 2022-03-18 DIAGNOSIS — Z9182 Personal history of military deployment: Secondary | ICD-10-CM | POA: Diagnosis not present

## 2022-03-18 DIAGNOSIS — R296 Repeated falls: Secondary | ICD-10-CM | POA: Diagnosis not present

## 2022-03-18 DIAGNOSIS — S2241XA Multiple fractures of ribs, right side, initial encounter for closed fracture: Principal | ICD-10-CM | POA: Insufficient documentation

## 2022-03-18 DIAGNOSIS — R69 Illness, unspecified: Secondary | ICD-10-CM | POA: Diagnosis not present

## 2022-03-18 DIAGNOSIS — G8911 Acute pain due to trauma: Secondary | ICD-10-CM | POA: Diagnosis not present

## 2022-03-18 DIAGNOSIS — Y92129 Unspecified place in nursing home as the place of occurrence of the external cause: Secondary | ICD-10-CM | POA: Diagnosis not present

## 2022-03-18 DIAGNOSIS — S2239XA Fracture of one rib, unspecified side, initial encounter for closed fracture: Secondary | ICD-10-CM

## 2022-03-18 DIAGNOSIS — M6281 Muscle weakness (generalized): Secondary | ICD-10-CM | POA: Diagnosis not present

## 2022-03-18 DIAGNOSIS — H9190 Unspecified hearing loss, unspecified ear: Secondary | ICD-10-CM | POA: Diagnosis not present

## 2022-03-18 DIAGNOSIS — W07XXXA Fall from chair, initial encounter: Secondary | ICD-10-CM | POA: Insufficient documentation

## 2022-03-18 DIAGNOSIS — F0280 Dementia in other diseases classified elsewhere without behavioral disturbance: Secondary | ICD-10-CM | POA: Insufficient documentation

## 2022-03-18 DIAGNOSIS — Z7982 Long term (current) use of aspirin: Secondary | ICD-10-CM | POA: Diagnosis not present

## 2022-03-18 DIAGNOSIS — Z743 Need for continuous supervision: Secondary | ICD-10-CM | POA: Diagnosis not present

## 2022-03-18 DIAGNOSIS — W19XXXA Unspecified fall, initial encounter: Secondary | ICD-10-CM | POA: Diagnosis not present

## 2022-03-18 DIAGNOSIS — M25551 Pain in right hip: Secondary | ICD-10-CM | POA: Diagnosis not present

## 2022-03-18 DIAGNOSIS — R55 Syncope and collapse: Secondary | ICD-10-CM | POA: Diagnosis present

## 2022-03-18 DIAGNOSIS — G4752 REM sleep behavior disorder: Secondary | ICD-10-CM | POA: Diagnosis not present

## 2022-03-18 DIAGNOSIS — G20 Parkinson's disease: Secondary | ICD-10-CM | POA: Diagnosis not present

## 2022-03-18 DIAGNOSIS — S299XXA Unspecified injury of thorax, initial encounter: Secondary | ICD-10-CM | POA: Diagnosis present

## 2022-03-18 DIAGNOSIS — I951 Orthostatic hypotension: Secondary | ICD-10-CM | POA: Diagnosis present

## 2022-03-18 DIAGNOSIS — S2241XD Multiple fractures of ribs, right side, subsequent encounter for fracture with routine healing: Secondary | ICD-10-CM | POA: Diagnosis not present

## 2022-03-18 DIAGNOSIS — Z9181 History of falling: Secondary | ICD-10-CM | POA: Diagnosis not present

## 2022-03-18 DIAGNOSIS — R0781 Pleurodynia: Secondary | ICD-10-CM | POA: Diagnosis not present

## 2022-03-18 LAB — CBC
HCT: 39.1 % (ref 39.0–52.0)
Hemoglobin: 13.1 g/dL (ref 13.0–17.0)
MCH: 30.4 pg (ref 26.0–34.0)
MCHC: 33.5 g/dL (ref 30.0–36.0)
MCV: 90.7 fL (ref 80.0–100.0)
Platelets: 217 10*3/uL (ref 150–400)
RBC: 4.31 MIL/uL (ref 4.22–5.81)
RDW: 12.9 % (ref 11.5–15.5)
WBC: 10.3 10*3/uL (ref 4.0–10.5)
nRBC: 0 % (ref 0.0–0.2)

## 2022-03-18 LAB — BASIC METABOLIC PANEL
Anion gap: 9 (ref 5–15)
BUN: 22 mg/dL (ref 8–23)
CO2: 27 mmol/L (ref 22–32)
Calcium: 9.1 mg/dL (ref 8.9–10.3)
Chloride: 106 mmol/L (ref 98–111)
Creatinine, Ser: 0.8 mg/dL (ref 0.61–1.24)
GFR, Estimated: >60 mL/min (ref >60)
Glucose, Bld: 104 mg/dL — ABNORMAL HIGH (ref 70–99)
Potassium: 4.2 mmol/L (ref 3.5–5.1)
Sodium: 142 mmol/L (ref 135–145)

## 2022-03-18 MED ORDER — MORPHINE SULFATE (PF) 4 MG/ML IV SOLN
4.0000 mg | Freq: Once | INTRAVENOUS | Status: AC
  Administered 2022-03-18: 4 mg via INTRAVENOUS
  Filled 2022-03-18: qty 1

## 2022-03-18 NOTE — ED Triage Notes (Signed)
Pt arrives via [facility] from [facility] Per report, pt has hx of weakness and had multiple falls yesterday. Today he had a witnessed fall. No blood thinners, did not hit his head. He is c/o pain in the right rib and right hip area. No deformity noted. Hx of dementia. En route 142/80, hr 64, rr 18, 94% on room air.  ?

## 2022-03-18 NOTE — ED Notes (Signed)
Attempted to ambulate pt. Pt reports too much pain at this time ?

## 2022-03-18 NOTE — ED Notes (Signed)
Myself and Nurse Tech assisted patient to stand to urinate. Pt. Required a great amount of assistance, tried to ambulate, but could only take a few shuffling steps. Pt.assisted back to bed. ?

## 2022-03-18 NOTE — ED Provider Notes (Signed)
?[facility] DEPT ?Provider Note ? ? ?CSN: [id number] ?Arrival date & time: 03/18/22  2008 ? ?  ? ?History ? ?Chief Complaint  ?Patient presents with  ? Fall  ? ? ?Kyle Mcguire is a 81 y.o. male. ? ? ?Fall ? ? ?  ?Patient has history of Parkinson's disease with dementia.  Patient also has a history of frequent falls.  Patient states he fell out of a chair last week and has been having pain in his right ribs since then.  According to the nursing facility he is had a few falls since yesterday.  Patient did not hit his head or lose consciousness.  Unclear if all witnessed. He has been complaining of some pain in his right rib area as well as right hip.  He was sent to the ED for further evaluation.  Patient denies any chest pain or shortness of breath.  He denies any weakness.  He denies fevers chills vomiting or diarrhea ?Home Medications ?Prior to Admission medications   ?Medication Sig Start Date End Date Taking? Authorizing Provider  ?aspirin 81 MG chewable tablet Chew 81 mg by mouth daily.    [provider]  ?carbidopa-levodopa (SINEMET IR) 25-100 MG tablet Take 1 tablet by mouth 4 (four) times daily. 0800, 1200, 1400, 2000 12/12/21   [name], MD  ?FIBER PO Take 1 tablet by mouth every other day.    [provider]  ?HYDROcodone-acetaminophen (NORCO/VICODIN) 5-325 MG tablet Take 1 tablet by mouth every 6 (six) hours as needed for moderate pain.    [provider]  ?   ? ?Allergies    ?Cyclobenzaprine   ? ?Review of Systems   ?Review of Systems  ?Constitutional:  Negative for fever.  ? ?Physical Exam ?Updated Vital Signs ?BP (!) 170/78   Pulse (!) 51   Temp 98.2 ?F (36.8 ?C) (Oral)   Resp 16   SpO2 97%  ?Physical Exam ?Vitals and nursing note reviewed.  ?Constitutional:   ?   Appearance: He is well-developed. He is not diaphoretic.  ?HENT:  ?   Head: Normocephalic and atraumatic.  ?   Right Ear: External ear normal.  ?   Left Ear: External ear normal.   ?Eyes:  ?   General: No scleral icterus.    ?   Right eye: No discharge.     ?   Left eye: No discharge.  ?   Conjunctiva/sclera: Conjunctivae normal.  ?Neck:  ?   Trachea: No tracheal deviation.  ?Cardiovascular:  ?   Rate and Rhythm: Normal rate and regular rhythm.  ?Pulmonary:  ?   Effort: Pulmonary effort is normal. No respiratory distress.  ?   Breath sounds: Normal breath sounds. No stridor. No wheezing or rales.  ?Chest:  ?   Comments: Tenderness palpation right lateral ribs, no crepitus, no ecchymoses ?Abdominal:  ?   General: Bowel sounds are normal. There is no distension.  ?   Palpations: Abdomen is soft.  ?   Tenderness: There is no abdominal tenderness. There is no guarding or rebound.  ?Musculoskeletal:     ?   General: No tenderness or deformity.  ?   Cervical back: Neck supple.  ?Skin: ?   General: Skin is warm and dry.  ?   Findings: No rash.  ?Neurological:  ?   General: No focal deficit present.  ?   Mental Status: He is alert.  ?   Cranial Nerves: No cranial nerve deficit (no facial  droop, extraocular movements intact, no slurred speech).  ?   Sensory: No sensory deficit.  ?   Motor: No abnormal muscle tone or seizure activity.  ?   Coordination: Coordination normal.  ?Psychiatric:     ?   Mood and Affect: Mood normal.  ? ? ?ED Results / Procedures / Treatments   ?Labs ?(all labs ordered are listed, but only abnormal results are displayed) ?Labs Reviewed  ?BASIC METABOLIC PANEL - Abnormal; Notable for the following components:  ?    Result Value  ? Glucose, Bld 104 (*)   ? All other components within normal limits  ?CBC  ? ? ?EKG ?EKG Interpretation ? ?Date/Time:  Tuesday March 18 2022 21:36:31 EDT ?Ventricular Rate:  53 ?PR Interval:  174 ?QRS Duration: 72 ?QT Interval:  458 ?QTC Calculation: 430 ?R Axis:   6 ?Text Interpretation: Sinus rhythm Low voltage, precordial leads Posterior infarct, old No significant change since last tracing Confirmed by Dorie Rank [phone]) on 03/18/2022 9:45:04  PM ? ?Radiology ?DG Ribs Unilateral W/Chest Right ? ?Result Date: 03/18/2022 ?CLINICAL DATA:  Fall, pain. EXAM: RIGHT RIBS AND CHEST - 3+ VIEW COMPARISON:  Chest x-ray 07/18/2021. FINDINGS: There are acute nondisplaced right lateral ninth and tenth rib fractures. There are healed right fourth, fifth and sixth rib fractures. There is no evidence for pneumothorax or pleural effusion. No focal lung infiltrate. Cardiomediastinal silhouette is stable, the heart is mildly enlarged. IMPRESSION: 1. Acute right ninth and tenth rib fractures. Electronically Signed   By: Ronney Asters M.D.   On: 03/18/2022 21:10  ? ?CT Head Wo Contrast ? ?Result Date: 03/18/2022 ?CLINICAL DATA:  Multiple falls. EXAM: CT HEAD WITHOUT CONTRAST TECHNIQUE: Contiguous axial images were obtained from the base of the skull through the vertex without intravenous contrast. RADIATION DOSE REDUCTION: This exam was performed according to the departmental dose-optimization program which includes automated exposure control, adjustment of the mA and/or kV according to patient size and/or use of iterative reconstruction technique. COMPARISON:  11/25/2021 FINDINGS: Brain: No acute intracranial abnormality. Specifically, no hemorrhage, hydrocephalus, mass lesion, acute infarction, or significant intracranial injury. Vascular: No hyperdense vessel or unexpected calcification. Skull: No acute calvarial abnormality. Sinuses/Orbits: Diffuse mucosal thickening.  No air-fluid levels. Other: None IMPRESSION: No acute intracranial abnormality. Chronic sinusitis. Electronically Signed   By: Rolm Baptise M.D.   On: 03/18/2022 20:53  ? ?DG Hip Unilat W or Wo Pelvis 2-3 Views Right ? ?Result Date: 03/18/2022 ?CLINICAL DATA:  Frequent falls, right hip pain. EXAM: DG HIP (WITH OR WITHOUT PELVIS) 2-3V RIGHT COMPARISON:  None. FINDINGS: There is no evidence of hip fracture or dislocation. No AP evidence of pelvic fracture or diastasis. There is mild spurring of the SI joints,  enthesopathic spurring of the pelvis and mild symmetric hip DJD. There are scattered pelvic phleboliths. 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 IMPRESSION: Osteopenia and degenerative change without evidence of fractures. Electronically Signed   By: Telford Nab M.D.   On: 03/18/2022 21:04   ? ?Procedures ?Procedures  ? ? ?Medications Ordered in ED ?Medications  ?morphine (PF) 4 MG/ML injection 4 mg (has no administration in time range)  ? ? ?ED Course/ Medical Decision Making/ A&P ?Clinical Course as of 03/18/22 2306  ?Tue Mar 18, 2022  ?2102 CT Head Wo Contrast ?Head CT without acute abnormalities [JK]  ?2126 DG Ribs Unilateral W/Chest Right ?X-ray show ninth and 10th rib fractures [JK]  ?2126 DG Hip Unilat W or Wo Pelvis  2-3 Views Right ?Hip x-rays without acute findings [JK]  ?1607 Basic metabolic panel(!) ?normal [JK]  ?2257 Case discussed with Dr. Hal Hope [JK]  ?  ?Clinical Course User Index ?[JK] Dorie Rank, MD  ? ?                        ?Medical Decision Making ?Amount and/or Complexity of Data Reviewed ?Independent Historian: caregiver ?   Details: Additional history provided by the patient's son.  He has fallen several times recently.  Unclear how he has been falling is generally patient requires assistance to get up ?External Data Reviewed: notes. ?   Details: Notes from previous visits reviewed.  Patient did have rib fractures of the ninth and 10th ribs back in December ?Labs: ordered. Decision-making details documented in ED Course. ?Radiology: ordered. Decision-making details documented in ED Course. ? ?Risk ?Prescription drug management. ?Decision regarding hospitalization. ? ? ?Patient with falls right-sided rib pain.  No clear history of syncope but certainly concern with his age and multiple falls.  Pt's dementia does not allow him to clearly tell me what happened. Patient without signs of anemia or acute infection.  X-rays do show rib fractures  without pneumothorax.  Patient given medications for pain.  CT scan without signs of acute injury. ? ?Discussed findings with son.  Patient at risk for recurrent falls.  We will consult with medical service for [facility]

## 2022-03-18 NOTE — ED Notes (Signed)
Right flank/Rib pain. Nothing obvious. Hx of same according to paperwork from EMS. ?

## 2022-03-19 DIAGNOSIS — S299XXA Unspecified injury of thorax, initial encounter: Secondary | ICD-10-CM | POA: Diagnosis not present

## 2022-03-19 DIAGNOSIS — W07XXXA Fall from chair, initial encounter: Secondary | ICD-10-CM | POA: Diagnosis not present

## 2022-03-19 DIAGNOSIS — Y92129 Unspecified place in nursing home as the place of occurrence of the external cause: Secondary | ICD-10-CM

## 2022-03-19 DIAGNOSIS — S2241XA Multiple fractures of ribs, right side, initial encounter for closed fracture: Secondary | ICD-10-CM

## 2022-03-19 DIAGNOSIS — Z7982 Long term (current) use of aspirin: Secondary | ICD-10-CM | POA: Diagnosis not present

## 2022-03-19 DIAGNOSIS — G20 Parkinson's disease: Secondary | ICD-10-CM | POA: Diagnosis not present

## 2022-03-19 DIAGNOSIS — W19XXXA Unspecified fall, initial encounter: Secondary | ICD-10-CM

## 2022-03-19 DIAGNOSIS — F0280 Dementia in other diseases classified elsewhere without behavioral disturbance: Secondary | ICD-10-CM | POA: Diagnosis not present

## 2022-03-19 DIAGNOSIS — I951 Orthostatic hypotension: Secondary | ICD-10-CM | POA: Diagnosis not present

## 2022-03-19 DIAGNOSIS — F02C0 Dementia in other diseases classified elsewhere, severe, without behavioral disturbance, psychotic disturbance, mood disturbance, and anxiety: Secondary | ICD-10-CM

## 2022-03-19 DIAGNOSIS — R55 Syncope and collapse: Secondary | ICD-10-CM

## 2022-03-19 DIAGNOSIS — S2239XA Fracture of one rib, unspecified side, initial encounter for closed fracture: Secondary | ICD-10-CM

## 2022-03-19 DIAGNOSIS — R69 Illness, unspecified: Secondary | ICD-10-CM | POA: Diagnosis not present

## 2022-03-19 DIAGNOSIS — R296 Repeated falls: Secondary | ICD-10-CM | POA: Diagnosis not present

## 2022-03-19 MED ORDER — POLYETHYLENE GLYCOL 3350 17 GM/SCOOP PO POWD: 17.0000 g | Freq: Two times a day (BID) | ORAL | 0 refills | Status: AC | PRN

## 2022-03-19 MED ORDER — SENNOSIDES-DOCUSATE SODIUM 8.6-50 MG PO TABS: 1.0000 | ORAL_TABLET | Freq: Two times a day (BID) | ORAL | 0 refills | Status: AC | PRN

## 2022-03-19 MED ORDER — SENNOSIDES-DOCUSATE SODIUM 8.6-50 MG PO TABS: 1.0000 | ORAL_TABLET | Freq: Two times a day (BID) | ORAL | 0 refills | Status: DC | PRN

## 2022-03-19 MED ORDER — SENNOSIDES-DOCUSATE SODIUM 8.6-50 MG PO TABS: 1.0000 | ORAL_TABLET | Freq: Two times a day (BID) | ORAL | Status: DC | PRN

## 2022-03-19 MED ORDER — ACETAMINOPHEN 500 MG PO TABS: ORAL_TABLET | ORAL | 0 refills | Status: DC

## 2022-03-19 MED ORDER — ACETAMINOPHEN 500 MG PO TABS: ORAL_TABLET | ORAL | 0 refills | Status: AC

## 2022-03-19 MED ORDER — ACETAMINOPHEN 500 MG PO TABS
1000.0000 mg | ORAL_TABLET | Freq: Three times a day (TID) | ORAL | Status: DC
  Administered 2022-03-19 – 2022-03-20 (×3): 1000 mg via ORAL
  Filled 2022-03-19 (×3): qty 2

## 2022-03-19 MED ORDER — ASPIRIN 81 MG PO CHEW
81.0000 mg | CHEWABLE_TABLET | Freq: Every day | ORAL | Status: DC
  Administered 2022-03-19 – 2022-03-20 (×2): 81 mg via ORAL
  Filled 2022-03-19 (×2): qty 1

## 2022-03-19 MED ORDER — LIDOCAINE 5 % EX PTCH
1.0000 | MEDICATED_PATCH | CUTANEOUS | Status: DC
  Administered 2022-03-19: 1 via TRANSDERMAL
  Filled 2022-03-19: qty 1

## 2022-03-19 MED ORDER — BENZONATATE 100 MG PO CAPS
100.0000 mg | ORAL_CAPSULE | Freq: Three times a day (TID) | ORAL | Status: DC | PRN
  Administered 2022-03-19: 100 mg via ORAL
  Filled 2022-03-19: qty 1

## 2022-03-19 MED ORDER — CARBIDOPA-LEVODOPA 25-100 MG PO TABS
1.0000 | ORAL_TABLET | Freq: Four times a day (QID) | ORAL | Status: DC
  Administered 2022-03-19 – 2022-03-20 (×4): 1 via ORAL
  Filled 2022-03-19 (×4): qty 1

## 2022-03-19 MED ORDER — POLYETHYLENE GLYCOL 3350 17 G PO PACK
17.0000 g | PACK | Freq: Two times a day (BID) | ORAL | Status: DC | PRN
Start: 2022-03-19 — End: 2022-03-20

## 2022-03-19 NOTE — Evaluation (Signed)
Occupational Therapy Evaluation ?Patient Details ?Name: Kyle Mcguire ?MRN: [id number] ?DOB: December 31, 1940 ?Today's Date: 03/19/2022 ? ? ?History of Present Illness 81 y/o male presented to ED on 03/18/22 from [facility] for witnessed fall as well as frequent falls and weakness. Found to have R 9-10 rib fxs. PMH: Parkinson's, dementia  ? ?Clinical Impression ?  ?Pt with severe limitations with selfcare tasks currently secondary to right flank/rib pain as well as dementia.  He currently needs max +2 for bed mobility as well as min +2 for sit to stand.  Total assist was needed to complete any dressing as well as for toilet hygiene secondary to bladder incontinence.  Feel pt will benefit from acute care OT to progress some with ADL function, however pt unreliable to state how much help he needed at [facility].  No family present this session either.  Feel he would return to memory unit if they can provide the current physical assist otherwise, recommend SNF.  Will continue to follow.  Pt with slight dizziness reported in standing as well.  BP in supine at 173/85, decreasing in sitting to 144/78, and then in standing at 119/63. ?   ? ?Recommendations for follow up therapy are one component of a multi-disciplinary discharge planning process, led by the attending physician.  Recommendations may be updated based on patient status, additional functional criteria and insurance authorization.  ? ?Follow Up Recommendations ? Other (comment) (return to memory care if they can provide 24 hr max physical assist)  ?  ?Assistance Recommended at Discharge Frequent or constant Supervision/Assistance  ?Patient can return home with the following A lot of help with walking and/or transfers;A lot of help with bathing/dressing/bathroom;Assistance with feeding;Assist for transportation;Direct supervision/assist for financial management;Help with stairs or ramp for entrance ? ?  ?Functional Status Assessment ? Patient has had a recent  decline in their functional status and demonstrates the ability to make significant improvements in function in a reasonable and predictable amount of time.  ?Equipment Recommendations ? None recommended by OT  ?  ?   ?Precautions / Restrictions Precautions ?Precautions: Fall ?Precaution Comments: hx of dementia and Parkinson's  ? ?  ? ?Mobility Bed Mobility ?Overal bed mobility: Needs Assistance ?Bed Mobility: Sit to Supine, Supine to Sit ?  ?  ?Supine to sit: +2 for physical assistance, Max assist ?Sit to supine: Max assist, +2 for physical assistance ?  ?General bed mobility comments: Pt needed assist with all aspects of bed mobility. ?  ? ?Transfers ?Overall transfer level: Needs assistance ?Equipment used: Rolling walker (2 wheels) ?Transfers: Sit to/from Stand ?Sit to Stand: +2 physical assistance, Min assist ?  ?  ?  ?  ?  ?General transfer comment: Mod demonstrational cueing for initiation of forward trunk flexion to start standing. ?  ? ?  ?Balance Overall balance assessment: Needs assistance ?Sitting-balance support: No upper extremity supported, Feet supported ?Sitting balance-Leahy Scale: Fair ?  ?  ?Standing balance support: Bilateral upper extremity supported, Reliant on assistive device for balance ?Standing balance-Leahy Scale: Poor ?Standing balance comment: Pt needs UE support to maintain balance in standing and with short distance mobility. ?  ?  ?  ?  ?  ?  ?  ?  ?  ?  ?  ?   ? ?ADL either performed or assessed with clinical judgement  ? ?ADL Overall ADL's : Needs assistance/impaired ?  ?  ?  ?  ?  ?  ?  ?  ?  ?  ?  Lower Body Dressing: Total assistance ?Lower Body Dressing Details (indicate cue type and reason): donning gripper socks sitting EOB ?Toilet Transfer: Minimal assistance;+2 for physical assistance;Rolling walker (2 wheels) ?Toilet Transfer Details (indicate cue type and reason): max demonstrational cueing for initiation and advancing the RW with simulated transfer ?Toileting- Clothing  Manipulation and Hygiene: Total assistance;Sit to/from stand ?Toileting - Clothing Manipulation Details (indicate cue type and reason): Pt unable to initiate task when given washcloth.  Instead started washing the table. ?  ?  ?Functional mobility during ADLs: +2 for physical assistance;Minimal assistance ?General ADL Comments: Pt needed max hand over hand for most tasks secondary to decreased attention and initiation.  Unsure if this is close to his baseline as no family was present.  Recommend discharge back to memory care unit only if they can provide max to total assist for selfcare tasks, otherwise recommend SNF level rehab post acute stay.  BP in supine at 173/85, sitting 144/78.  He reported dizziness in standing as well with BP at 119/63.  ? ? ? ?Vision Patient Visual Report: No change from baseline ?Additional Comments: Unable to determine secondadry to cognition.  ?   ?Perception Perception ?Perception: Not tested ?  ?Praxis Praxis ?Praxis: Impaired ?Praxis Impairment Details: Initiation;Motor planning ?  ? ?Pertinent Vitals/Pain Pain Assessment ?Pain Assessment: PAINAD ?Breathing: normal ?Negative Vocalization: occasional moan/groan, low speech, negative/disapproving quality ?Facial Expression: facial grimacing ?Body Language: relaxed ?Consolability: distracted or reassured by voice/touch ?PAINAD Score: 4 ?Pain Location: right flank/ribs ?Pain Intervention(s): Limited activity within patient's tolerance, Monitored during session, Repositioned  ? ? ? ?Hand Dominance Right (Pt verbalized right, but then put the washcloth in his left hand to wash the bedside table.) ?  ?Extremity/Trunk Assessment Upper Extremity Assessment ?Upper Extremity Assessment: Difficult to assess due to impaired cognition (grip 3+/5 on the right and 4/5 on the left) ?  ?Lower Extremity Assessment ?Lower Extremity Assessment: Defer to PT evaluation ?  ?Cervical / Trunk Assessment ?Cervical / Trunk Assessment: Normal ?  ?Communication  Communication ?Communication: Expressive difficulties (Increased difficulty answering all questions secondary to confusion as well as increased pain.) ?  ?Cognition Arousal/Alertness: Awake/alert ?Behavior During Therapy: Flat affect ?Overall Cognitive Status: No family/caregiver present to determine baseline cognitive functioning ?  ?  ?  ?  ?  ?  ?  ?  ?  ?  ?  ?  ?  ?  ?  ?  ?General Comments: Pt with history of dementia and no family present to confirm change in cognition.  He was able to state "hospital" when given 3 choices but unable to state reason for hospitalization or city he was in.  Inconsistent with following any one step commands related to selfcare tasks or mobility.  Max demonstrational cueing needed for mobility with use of the walker as well as min +2 from therapists. ?  ?  ?   ?   ?   ? ? ?Home Living Family/patient expects to be discharged to:: Other (Comment) [facility] memory care unit) ?  ?  ?  ?  ?  ?  ?  ?  ?  ?  ?  ?  ?  ?  ?  ?  ?Additional Comments: Pt unable to state PLOF or equipment used except for the walker in his room. ?  ? ?  ?   ? ?  ?  ?OT Problem List: Decreased strength;Decreased knowledge of use of DME or AE;Decreased cognition;Impaired balance (sitting and/or standing);Decreased safety awareness;Pain ?  ?   ?  OT Treatment/Interventions: Self-care/ADL training;Patient/family education;Balance training;Therapeutic activities;DME and/or AE instruction;Cognitive remediation/compensation  ?  ?OT Goals(Current goals can be found in the care plan section) Acute Rehab OT Goals ?Patient Stated Goal: Pt did not state during session ?OT Goal Formulation: Patient unable to participate in goal setting ?Time For Goal Achievement: 04/02/22 ?Potential to Achieve Goals: Fair  ?OT Frequency: Min 2X/week ?  ? ?Co-evaluation PT/OT/SLP Co-Evaluation/Treatment: Yes ?Reason for Co-Treatment: Complexity of the patient's impairments (multi-system involvement);Necessary to address  cognition/behavior during functional activity ?  ?OT goals addressed during session: ADL's and self-care ?  ? ?  ?AM-PAC OT "6 Clicks" Daily Activity     ?Outcome Measure Help from another person eating meals?: A Lot ?Hel

## 2022-03-19 NOTE — TOC Transition Note (Signed)
Transition of Care (TOC) - CM/SW Discharge Note ? ? ?Patient Details  ?Name: Kyle Mcguire ?MRN: [id number] ?Date of Birth: 1941/07/28 ? ?Transition of Care (TOC) CM/SW Contact:  ?Benard Halsted, LCSW ?Phone Number: ?03/19/2022, 1:39 PM ? ? ?Clinical Narrative:    ?Patient will DC to: [facility] ?Anticipated DC date: 03/19/22 ?Family notified: Spouse ?Transport by: Corey Harold ? ? ?Per MD patient ready for DC to [facility]. RN to call report prior to discharge [phone]). RN, patient, patient's family, and facility notified of DC. Discharge Summary sent to facility (observation status). DC packet on chart. Ambulance transport requested for patient.  ? ?CSW will sign off for now as social work intervention is no longer needed. Please consult [place] again if new needs arise. ? ? ? ? ?Final next level of care: Memory Care ?Barriers to Discharge: No Barriers Identified ? ? ?Patient Goals and CMS Choice ?Patient states their goals for this hospitalization and ongoing recovery are:: Return to facility ?CMS Medicare.gov Compare Post Acute Care list provided to:: Patient Represenative (must comment) ?Choice offered to / list presented to : Spouse ? ?Discharge Placement ?  ?           ?  ?Patient to be transferred to facility by: [facility] ?Name of family member notified: Spouse ?Patient and family notified of of transfer: 03/19/22 ? ?Discharge Plan and Services ?In-house Referral: Clinical Social Work ?  ?           ?  ?  ?  ?  ?  ?  ?  ?  ?  ?  ? ?Social Determinants of Health (SDOH) Interventions ?  ? ? ?Readmission Risk Interventions ?   ? View : No data to display.  ?  ?  ?  ? ? ? ? ? ?

## 2022-03-19 NOTE — ED Notes (Signed)
[facility] here to transport pt. To [facility] ?

## 2022-03-19 NOTE — TOC Initial Note (Addendum)
Transition of Care (TOC) - Initial/Assessment Note  ? ? ?Patient Details  ?Name: Kyle Mcguire ?MRN: [id number] ?Date of Birth: 1941/10/09 ? ?Transition of Care (TOC) CM/SW Contact:    ?Benard Halsted, LCSW ?Phone Number: ?03/19/2022, 11:03 AM ? ?Clinical Narrative:                 ?11am-Per MD, patient ready for discharge. CSW contacted [facility] memory care and left a voicemail for patient's nurse.  ? ?1pm-CSW contacted [facility] again and made them aware of patient's discharge. CSW will fax dc summary to [phone]. CSW spoke with patient's spouse and she confirmed plan and requested [facility] for transport (nothing available with [facility]).  ? ? ?Expected Discharge Plan: Memory Care ?Barriers to Discharge: No Barriers Identified ? ? ?Patient Goals and CMS Choice ?Patient states their goals for this hospitalization and ongoing recovery are:: Return to facility ?CMS Medicare.gov Compare Post Acute Care list provided to:: Patient Represenative (must comment) ?Choice offered to / list presented to : Spouse ? ?Expected Discharge Plan and Services ?Expected Discharge Plan: Memory Care ?In-house Referral: Clinical Social Work ?  ?  ?Living arrangements for the past 2 months: [place] ?                ?  ?  ?  ?  ?  ?  ?  ?  ?  ?  ? ?Prior Living Arrangements/Services ?Living arrangements for the past 2 months: [place] ?Lives with:: Facility Resident ?Patient language and need for interpreter reviewed:: Yes ?Do you feel safe going back to the place where you live?: Yes      ?Need for Family Participation in Patient Care: Yes (Comment) ?Care giver support system in place?: Yes (comment) ?  ?Criminal Activity/Legal Involvement Pertinent to Current Situation/Hospitalization: No - Comment as needed ? ?Activities of Daily Living ?  ?  ? ?Permission Sought/Granted ?Permission sought to share information with : Customer service manager ?Permission granted to share information with :  No ? Share Information with NAME: [name] ? Permission granted to share info w AGENCY: [facility] ? Permission granted to share info w Relationship: Spouse ? Permission granted to share info w Contact Information: [phone] ? ?Emotional Assessment ?Appearance:: Appears stated age ?Attitude/Demeanor/Rapport: Unable to Assess ?Affect (typically observed): Unable to Assess ?Orientation: : Oriented to Self, Oriented to  Time ?Alcohol / Substance Use: Not Applicable ?Psych Involvement: No (comment) ? ?Admission diagnosis:  Near syncope [R55] ?Frequent falls [R29.6] ?Closed fracture of multiple ribs of right side, initial encounter [S22.41XA] ?Patient Active Problem List  ? Diagnosis Date Noted  ? Near syncope 03/18/2022  ? Parkinson's disease dementia ([place]) 03/17/2019  ? 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 Snoring 09/21/2017  ? Memory loss 08/26/2017  ? 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 Parkinson's disease ([place]) 12/31/2014  ? ?PCP:  Vernie Shanks, MD ?Pharmacy:  No Pharmacies Listed ? ? ? ?Social Determinants of Health (SDOH) Interventions ?  ? ?Readmission Risk Interventions ?   ? View : No data to display.  ?  ?  ?  ? ? ? ?

## 2022-03-19 NOTE — Progress Notes (Signed)
Patient wife is at bedside and he's protesting his discharge. She wants to talk to the doctor or Education officer, museum. Notified Dr. Cyd Silence  and awaiting a response.  ?

## 2022-03-19 NOTE — Progress Notes (Signed)
?  Transition of Care (TOC) Screening Note ? ? ?Patient Details  ?Name: Kyle Mcguire ?Date of Birth: 02/14/1941 ? ? ?Transition of Care (TOC) CM/SW Contact:    ?Benard Halsted, LCSW ?Phone Number: ?03/19/2022, 9:20 AM ? ? ? ?Transition of Care Department [facility]) has reviewed patient. We will continue to monitor patient advancement through interdisciplinary progression rounds for ability to return to [facility].  ? ? ?

## 2022-03-19 NOTE — Progress Notes (Signed)
HOSPITAL MEDICINE OVERNIGHT EVENT NOTE   ? ?Notified by nursing that wife is at the bedside and both she and her son wish to the patient not be discharged this evening. ? ?I had the opportunity to have a lengthy discussion with the wife about her concerns. ? ?She states that she feels that her husband's pain is not adequately controlled.  She feels that a lot of this comes from the fact that he is still exhibiting an uncontrolled cough.  She also reports that because of this he is not eating and is very concerned that he is not safe to be discharged. ? ?I explained to her that with the rib fractures this will take time to heal and that due to the patient's advanced dementia and orthostatic hypotension that they we will continue to be at very high risk of falls. ? ?I answered all of the wife's questions to her satisfaction but despite this she still insists that her husband not be discharged this evening.  We will therefore postpone the discharge until tomorrow.  We will go ahead and order Tessalon Perles for a nonsedating means of an antitussive and additionally provided topical Lidoderm patch for pain control in addition to the scheduled Tylenol. ? ?Vernelle Emerald  MD ?[facility]  ? ? ? ? ? ? ? ? ? ? ?

## 2022-03-19 NOTE — H&P (Signed)
?History and Physical  ? ? ?AGUSTIN SWATEK MWN:[id number] DOB: 1941/05/18 DOA: 03/18/2022 ? ?PCP: Vernie Shanks, MD ?Patient coming from: [facility] memory care ? ?Chief Complaint: Fall ? ?HPI:  81 year old M with PMH of Parkinson's dementia, orthostatic hypotension and recurrent falls brought to [facility] ED from [place]/memory care with right-sided chest wall pain after fall at memory care.  ? ?In ED, vitals and blood work including CBC and BMP without significant finding.  EKG with sinus bradycardia to 53 with no acute ischemic finding or abnormal intervals.  Patient is not on nodal blocking agent.  Heart rate has been in the 60s and 70s.  ?Right rib/chest x-ray with acute right ninth and 10th rib fracture.  Right hip and pelvic x-ray without acute finding.  Patient was given IV morphine in ED. Admission accepted by overnight admitted, and evaluated by me the next day. ? ?Patient is awake and alert but only oriented to self.  Patient responds no to pain but tender to palpation over right chest wall.  He otherwise looks comfortable with no distress. Per patient's wife, patient has recurrent fall partly from not remembering to use his rolling walker.  He had previous rib fractures from fall.  He has therapy twice a week at [facility].  No other complaints or issues.  No report of fever, respiratory, GI or UTI symptoms.  Per patient's wife, he is a DNR/DNI. ? ?Patient was evaluated by therapy.  Per PT, orthostatic vitals supine 173/85, sitting 144/78, standing 119/78, standing x 3mns 131/111.  He complained of dizziness.  ? ?After lengthy discussion with patient's wife over the phone, we felt [place] is [place].  He will be at risk for delirium and agitation in the hospital.  We will schedule Tylenol 1 g every 8 hours for the next 5 days then as needed.  Encourage incentive spirometry although this would be difficult with his  severe dementia.  We will place TED hose for his orthostatic hypotension.   ? ? ? ?ROS ?Limited review of system due to patient's severe dementia ? ?PMH ?Past Medical History:  ?Diagnosis Date  ? [place]   ? Falls   ? Hearing loss   ? [place]'s [place]   ? ?PSH ?Past Surgical History:  ?Procedure Laterality Date  ? no surgical history per patient    ? None    ? ?Fam HX ?Family History  ?Problem Relation Age of Onset  ? Parkinsonism Father   ? ? ?Social Hx ? reports that he has never smoked. He has never used smokeless tobacco. He reports that he does not drink alcohol and does not use drugs. ? ?Allergy ?Allergies  ?Allergen Reactions  ? Cyclobenzaprine Other (See Comments)  ?  off balance, multiple falls  ? ?Home Meds ?Prior to Admission medications   ?Medication Sig Start Date End Date Taking? Authorizing Provider  ?acetaminophen (TYLENOL) 500 MG tablet Take 500 mg by mouth every 6 (six) hours as needed for fever, mild pain or headache.   Yes [provider]  ?alum & mag hydroxide-simeth (MAALOX/MYLANTA) 200-200-20 MG/5ML suspension Take 30 mLs by mouth every 6 (six) hours as needed for indigestion or heartburn.   Yes [provider]  ?aspirin 81 MG chewable tablet Chew 81 mg by mouth daily.   Yes [provider]  ?carbidopa-levodopa (SINEMET IR) 25-100 MG tablet Take 1 tablet by mouth 4 (four)  times daily. 0800, 1200, 1400, 2000 12/12/21  Yes [name], MD  ?Cyanocobalamin 1000 MCG/ML KIT Inject 1,000 mcg into the muscle every 30 (thirty) days.   Yes [provider]  ?guaiFENesin (MUCINEX) 600 MG 12 hr tablet Take 600 mg by mouth 2 (two) times daily. For 7 days   Yes [provider]  ?guaiFENesin (ROBITUSSIN) 100 MG/5ML liquid Take 200 mg by mouth every 6 (six) hours as needed for cough.   Yes [provider]  ?loperamide (IMODIUM) 2 MG capsule Take 2 mg by mouth as needed for diarrhea or loose stools.   Yes [provider]   ?magnesium hydroxide (MILK OF MAGNESIA) 400 MG/5ML suspension Take 30 mLs by mouth daily as needed for heartburn or indigestion.   Yes [provider]  ?Menthol-Zinc Oxide (CALMOSEPTINE) 0.44-20.6 % OINT Apply 1 application. topically in the morning, at noon, and at bedtime. Apply to buttocks   Yes [provider]  ?neomycin-bacitracin-polymyxin (NEOSPORIN) [phone] ointment Apply 1 application. topically as needed (minor skin tears or abrasions).   Yes [provider]  ?polycarbophil (FIBERCON) 625 MG tablet Take 625 mg by mouth daily.   Yes [provider]  ?polyethylene glycol powder (MIRALAX) 17 GM/SCOOP powder Take 17 g by mouth 2 (two) times daily as needed for moderate constipation. 03/19/22  Yes Mercy Riding, MD  ?senna-docusate (SENOKOT-S) 8.6-50 MG tablet Take 1 tablet by mouth 2 (two) times daily between meals as needed for mild constipation. 03/19/22  Yes Mercy Riding, MD  ?Zinc Oxide (DESITIN EX) Apply 1 application. topically See admin instructions. Apply to scrotum once daily scheduled and as needed after each incontinence episode   Yes [provider]  ?acetaminophen (TYLENOL) 500 MG tablet Take 2 tablets (1,000 mg total) by mouth every 8 (eight) hours for 5 days, THEN 2 tablets (1,000 mg total) every 8 (eight) hours as needed for up to 25 days. 03/19/22 04/18/22  Mercy Riding, MD  ? ? ?Physical Exam: ?Vitals:  ? 03/19/22 0435 03/19/22 0541 03/19/22 0754 03/19/22 1212  ?BP: (!) 141/70 (!) 165/64 138/73 119/63  ?Pulse: 60 62 61 77  ?Resp: _0 ?Temp: 98.1 ?F (36.7 ?C) 98.1 ?F (36.7 ?C) 98 ?F (36.7 ?C)   ?TempSrc: Oral Axillary Oral   ?SpO2: 96% 91% 93%   ? ? ?GENERAL: No acute distress.  Appears well.  ?HEENT: MMM.  Vision and hearing grossly intact.  ?NECK: Supple.  No apparent JVD.  ?RESP: 93% on RA.  No IWOB. Good air movement bilaterally. ?CVS:  RRR. Heart sounds normal.  ?ABD/GI/GU: Bowel sounds present. Soft.  Mild discomfort but no tenderness  or rebound. ?MSK/EXT:  Moves extremities.  Mild tenderness over right chest laterally.  No bruising or ecchymosis.  No crepitus. ?SKIN: no apparent skin lesion or wound ?NEURO: Awake and alert.  Oriented only to self.  Follows some commands.  RLE resting tremor.  No gross deficit.  ?PSYCH: Calm. Normal affect.  ? ?Personally Reviewed Radiological Exams ?See HPI ? ? ?Personally Reviewed Labs: ?CBC: ?Recent Labs  ?Lab 03/18/22 ?2027  ?WBC 10.3  ?HGB 13.1  ?HCT 39.1  ?MCV 90.7  ?PLT 217  ? ?Basic Metabolic Panel: ?Recent Labs  ?Lab 03/18/22 ?2027  ?NA 142  ?K 4.2  ?CL 106  ?CO2 27  ?GLUCOSE 104*  ?BUN 22  ?CREATININE 0.80  ?CALCIUM 9.1  ? ?GFR: ?CrCl cannot be calculated (Unknown ideal weight.). ?Liver Function Tests: ?No results for input(s):  AST, ALT, ALKPHOS, BILITOT, PROT, ALBUMIN in the last 168 hours. ?No results for input(s): LIPASE, AMYLASE in the last 168 hours. ?No results for input(s): AMMONIA in the last 168 hours. ?Coagulation Profile: ?No results for input(s): INR, PROTIME in the last 168 hours. ?Cardiac Enzymes: ?No results for input(s): CKTOTAL, CKMB, CKMBINDEX, TROPONINI in the last 168 hours. ?BNP (last 3 results) ?No results for input(s): PROBNP in the last 8760 hours. ?HbA1C: ?No results for input(s): HGBA1C in the last 72 hours. ?CBG: ?No results for input(s): GLUCAP in the last 168 hours. ?Lipid Profile: ?No results for input(s): CHOL, HDL, LDLCALC, TRIG, CHOLHDL, LDLDIRECT in the last 72 hours. ?Thyroid Function Tests: ?No results for input(s): TSH, T4TOTAL, FREET4, T3FREE, THYROIDAB in the last 72 hours. ?Anemia Panel: ?No results for input(s): VITAMINB12, FOLATE, FERRITIN, TIBC, IRON, RETICCTPCT in the last 72 hours. ?Urine analysis: ?   ?Component Value Date/Time  ? COLORURINE COLORLESS (A) 07/18/2021 1829  ? APPEARANCEUR Clear 07/22/2021 1628  ? LABSPEC 1.001 (L) 07/18/2021 1829  ? PHURINE 8.0 07/18/2021 1829  ? GLUCOSEU Negative 07/22/2021 1628  ? HGBUR MODERATE (A) 07/18/2021 1829  ?  BILIRUBINUR Negative 07/22/2021 1628  ? [place] NEGATIVE 07/18/2021 1829  ? PROTEINUR Trace 07/22/2021 1628  ? [place] NEGATIVE 07/18/2021 1829  ? NITRITE Negative 07/22/2021 1628  ? NITRITE NEGATIVE 08/04/202

## 2022-03-19 NOTE — Assessment & Plan Note (Addendum)
Has right ninth and 10th rib fracture with no displacement.  No respiratory distress or pneumothorax.  Likely from his recent fall. ?-Continue encouraging incentive spirometry ?-Fall precaution ?-Scheduled Tylenol for the next 5 days followed by as needed for pain control ?

## 2022-03-19 NOTE — Assessment & Plan Note (Signed)
Could be due to orthostatic hypotension.  No significant finding on EKG.  Basic labs reassuring. ?-Fall precaution, TED hose--- ?

## 2022-03-19 NOTE — Progress Notes (Signed)
Report called to Roselyn Reef, Therapist, sports at [facility].  ?

## 2022-03-19 NOTE — Progress Notes (Signed)
Discharge orders cancelled by Dr. Cyd Silence after his conversation with patient's wife. Called and notified the facility and [facility] about the cancellation. Will continue to monitor. ?

## 2022-03-19 NOTE — Hospital Course (Addendum)
81 year old M with PMH of Parkinson's dementia, orthostatic hypotension and recurrent falls brought to [facility] ED from [facility]/memory care with right-sided chest wall pain after fall at memory care.  ? ?In ED, vitals and blood work including CBC and BMP without significant finding.  EKG with sinus bradycardia to 53 with no acute ischemic finding or abnormal intervals.  Patient is not on nodal blocking agent.  Heart rate has been in the 60s and 70s.  ?Right rib/chest x-ray with acute right ninth and 10th rib fracture.  Right hip and pelvic x-ray without acute finding.  Patient was given IV morphine in ED. Admission accepted by overnight admitted, and evaluated by me the next day. ? ?Patient is awake and alert but only oriented to self.  Patient responds no to pain but tender to palpation over right chest wall.  He otherwise looks comfortable with no distress. Per patient's wife, patient has recurrent fall partly from not remembering to use his rolling walker.  He had previous rib fractures from fall.  He has therapy twice a week at [facility].  No other complaints or issues.  No report of fever, respiratory, GI or UTI symptoms.  Per patient's wife, he is a DNR/DNI. ? ?Patient was evaluated by therapy.  Per PT, orthostatic vitals supine 173/85, sitting 144/78, standing 119/78, standing x 23mns 131/111.  He complained of dizziness.  ? ?After lengthy discussion with patient's wife over the phone, we felt it is [place].  He will be at risk for delirium and agitation in the hospital.  We will schedule Tylenol 1 g every 8 hours for the next 5 days then as needed.  Encourage incentive spirometry although this would be difficult with his severe dementia.  We will place TED hose for his orthostatic hypotension.  ?

## 2022-03-19 NOTE — Evaluation (Signed)
Physical Therapy Evaluation ?Patient Details ?Name: Kyle Mcguire ?MRN: [id number] ?DOB: 08/19/1941 ?Today's Date: 03/19/2022 ? ?History of Present Illness ? 81 y/o male presented to ED on 03/18/22 from [facility] for witnessed fall as well as frequent falls and weakness. Found to have R 9-10 rib fxs. PMH: Parkinson's, dementia  ?Clinical Impression ? Patient admitted with above findings. Patient presents with generalized weakness, decreased activity tolerance, impaired balance, impaired cognition, and pain. Patient complaining of dizziness during mobility, see below for orthostatics. Patient requires maxA+2 for bed mobility due to pain and minA+2 for transfers and short distance mobility with RW. Patient oriented to self. Patient will benefit from skilled PT services during acute stay to address listed deficits. Recommend return to memory care unit at [facility] as long as facility is able to provide 24 hour maxA for all mobility.  ? ?Orthostatic BPs ? ?Supine 173/85  ?Sitting 144/78  ?Standing 119/63  ?Standing after 3 min 131/111  ? ? ?   ? ?Recommendations for follow up therapy are one component of a multi-disciplinary discharge planning process, led by the attending physician.  Recommendations may be updated based on patient status, additional functional criteria and insurance authorization. ? ?Follow Up Recommendations Other (comment) (return to memory care unit if able to provide 24 hour maxA) ? ?  ?Assistance Recommended at Discharge Frequent or constant Supervision/Assistance  ?Patient can return home with the following ? Two people to help with walking and/or transfers;Two people to help with bathing/dressing/bathroom;Assistance with feeding ? ?  ?Equipment Recommendations None recommended by PT  ?Recommendations for Other Services ?    ?  ?Functional Status Assessment Patient has had a recent decline in their functional status and/or demonstrates limited ability to make significant improvements in  function in a reasonable and predictable amount of time  ? ?  ?Precautions / Restrictions Precautions ?Precautions: Fall ?Precaution Comments: hx of dementia and Parkinson's ?Restrictions ?Weight Bearing Restrictions: No  ? ?  ? ?Mobility ? Bed Mobility ?Overal bed mobility: Needs Assistance ?Bed Mobility: Supine to Sit, Sit to Supine ?  ?  ?Supine to sit: Max assist, +2 for physical assistance ?Sit to supine: Max assist, +2 for physical assistance ?  ?General bed mobility comments: Pt needed assist with all aspects of bed mobility. ?  ? ?Transfers ?Overall transfer level: Needs assistance ?Equipment used: Rolling Jennie Bolar (2 wheels) ?Transfers: Sit to/from Stand ?Sit to Stand: Min assist, +2 physical assistance ?  ?  ?  ?  ?  ?General transfer comment: Mod demonstrational cueing for initiation of forward trunk flexion to start standing. ?  ? ?Ambulation/Gait ?Ambulation/Gait assistance: Min assist, +2 physical assistance ?Gait Distance (Feet): 5 Feet ?Assistive device: Rolling Danitra Payano (2 wheels) ?Gait Pattern/deviations: Step-to pattern, Decreased stride length, Shuffle ?Gait velocity: decreased ?  ?  ?General Gait Details: shuffling steps and required assistance for balance and RW management during turns. Patient complaining of dizziness ? ?Stairs ?  ?  ?  ?  ?  ? ?Wheelchair Mobility ?  ? ?Modified Rankin (Stroke Patients Only) ?  ? ?  ? ?Balance Overall balance assessment: Needs assistance ?Sitting-balance support: No upper extremity supported, Feet supported ?Sitting balance-Leahy Scale: Fair ?  ?  ?Standing balance support: Bilateral upper extremity supported, Reliant on assistive device for balance ?Standing balance-Leahy Scale: Poor ?Standing balance comment: Pt needs UE support to maintain balance in standing and with short distance mobility. ?  ?  ?  ?  ?  ?  ?  ?  ?  ?  ?  ?   ? ? ? ?  Pertinent Vitals/Pain Pain Assessment ?Pain Assessment: PAINAD ?Breathing: normal ?Negative Vocalization: occasional  moan/groan, low speech, negative/disapproving quality ?Facial Expression: facial grimacing ?Body Language: relaxed ?Consolability: distracted or reassured by voice/touch ?PAINAD Score: 4 ?Pain Location: right flank/ribs ?Pain Intervention(s): Monitored during session  ? ? ?Home Living Family/patient expects to be discharged to:: Other (Comment) [facility] memory care unit) ?  ?  ?  ?  ?  ?  ?  ?  ?  ?Additional Comments: Pt unable to state PLOF or equipment used except for the Ghalia Reicks in his room.  ?  ?Prior Function   ?  ?  ?  ?  ?  ?  ?  ?  ?  ? ? ?Hand Dominance  ? Dominant Hand: Right (Pt verbalized right, but then put the washcloth in his left hand to wash the bedside table.) ? ?  ?Extremity/Trunk Assessment  ? Upper Extremity Assessment ?Upper Extremity Assessment: Defer to OT evaluation ?  ? ?Lower Extremity Assessment ?Lower Extremity Assessment: Difficult to assess due to impaired cognition ?  ? ?Cervical / Trunk Assessment ?Cervical / Trunk Assessment: Normal  ?Communication  ? Communication: Expressive difficulties (Increased difficulty answering all questions secondary to confusion as well as increased pain.)  ?Cognition Arousal/Alertness: Awake/alert ?Behavior During Therapy: Flat affect ?Overall Cognitive Status: No family/caregiver present to determine baseline cognitive functioning ?  ?  ?  ?  ?  ?  ?  ?  ?  ?  ?  ?  ?  ?  ?  ?  ?General Comments: Pt with history of dementia and no family present to confirm change in cognition.  He was able to state "hospital" when given 3 choices but unable to state reason for hospitalization or city he was in.  Inconsistent with following any one step commands related to selfcare tasks or mobility.  Max demonstrational cueing needed for mobility with use of the Mayte Diers as well as min +2 from therapists. ?  ?  ? ?  ?General Comments   ? ?  ?Exercises    ? ?Assessment/Plan  ?  ?PT Assessment Patient needs continued PT services  ?PT Problem List Decreased  strength;Decreased activity tolerance;Decreased balance;Decreased mobility;Decreased cognition ? ?   ?  ?PT Treatment Interventions DME instruction;Gait training;Functional mobility training;Therapeutic activities;Therapeutic exercise;Balance training;Patient/family education   ? ?PT Goals (Current goals can be found in the Care Plan section)  ?Acute Rehab PT Goals ?Patient Stated Goal: did not state ?PT Goal Formulation: Patient unable to participate in goal setting ?Time For Goal Achievement: 04/02/22 ?Potential to Achieve Goals: Fair ? ?  ?Frequency Min 2X/week ?  ? ? ?Co-evaluation PT/OT/SLP Co-Evaluation/Treatment: Yes ?Reason for Co-Treatment: Necessary to address cognition/behavior during functional activity;For patient/therapist safety ?PT goals addressed during session: Mobility/safety with mobility ?OT goals addressed during session: ADL's and self-care ?  ? ? ?  ?AM-PAC PT "6 Clicks" Mobility  ?Outcome Measure Help needed turning from your back to your side while in a flat bed without using bedrails?: Total ?Help needed moving from lying on your back to sitting on the side of a flat bed without using bedrails?: Total ?Help needed moving to and from a bed to a chair (including a wheelchair)?: Total ?Help needed standing up from a chair using your arms (e.g., wheelchair or bedside chair)?: Total ?Help needed to walk in hospital room?: Total ?Help needed climbing 3-5 steps with a railing? : Total ?6 Click Score: 6 ? ?  ?End of Session   ?Activity Tolerance:  Patient tolerated treatment well ?Patient left: in bed;with call bell/phone within reach;with bed alarm set ?Nurse Communication: Mobility status ?PT Visit Diagnosis: Unsteadiness on feet (R26.81);Muscle weakness (generalized) (M62.81);History of falling (Z91.81) ?  ? ?Time: 6381-7711 ?PT Time Calculation (min) (ACUTE ONLY): 34 min ? ? ?Charges:   PT Evaluation ?$PT Eval Moderate Complexity: 1 Mod ?  ?  ?   ? ? ?Angelique Chevalier A. Gilford Rile, PT, DPT ?Acute  Rehabilitation Services ?Pager [phone] ?Office [phone] ? ? ?Zadkiel Dragan A Kannon Granderson ?03/19/2022, 1:13 PM ? ?

## 2022-03-19 NOTE — Progress Notes (Signed)
The patient is admitted form DB to 5 W 02 in stable condition. A & O 2, and as a result, the admission profile will be completed when the family gets to the bedside.  ?

## 2022-03-19 NOTE — Discharge Summary (Addendum)
? ?Physician Discharge Summary  ?Kyle Mcguire LZJ:[id number] DOB: [birth year] DOA: 03/18/2022 ? ?PCP: Vernie Shanks, MD ? ?Admit date: 03/18/2022 ?Discharge date: 03/20/2022 ?Admitted From: [facility] memory care/ALF ?Disposition: [facility] memory care/ALF ? ?Recommendations for Outpatient Follow-up:  ?Follow ups as below. ?Please obtain CBC/BMP/Mag in 1 week. ?Fall precautions-consistent use of rolling walker and stepwise approach when he gets up and move ?Ensure good hydration-high risk for dehydration given severe dementia ?Encourage TED hose and incentive spirometry ?Continue PT/OT-recommend trial of daily therapy at least for 1 week if possible ?Consider palliative care involvement ?Please follow up on the following pending results: None ? ? ?Discharge Condition: Stable but guarded prognosis. ?CODE STATUS: DNR/DNI ? ? ?Hospital course ?81 year old M with PMH of Parkinson's dementia, orthostatic hypotension and recurrent falls brought to [facility] ED from [facility]/memory care with right-sided chest wall pain after fall at memory care.  ? ?In ED, vitals and blood work including CBC and BMP without significant finding.  EKG with sinus bradycardia to 53 with no acute ischemic finding or abnormal intervals.  Patient is not on nodal blocking agent.  Heart rate has been in the 60s and 70s.  ?Right rib/chest x-ray with acute right ninth and 10th rib fracture.  Right hip and pelvic x-ray without acute finding.  Patient was given IV morphine in ED. Admission accepted by overnight admitted, and evaluated by me the next day. ? ?Patient is awake and alert but only oriented to self.  Patient responds no to pain but tender to palpation over right chest wall.  He otherwise looks comfortable with no distress. Per patient's wife, patient has recurrent fall partly from not remembering to use his rolling walker.  He had previous rib fractures from fall.  He has therapy twice a week at [facility].  No other  complaints or issues.  No report of fever, respiratory, GI or UTI symptoms.  Per patient's wife, he is a DNR/DNI. ? ?Patient was evaluated by therapy.  Per PT, orthostatic vitals supine 173/85, sitting 144/78, standing 119/78, standing x 40mns 131/111.  He complained of dizziness.  ? ?After lengthy discussion with patient's wife over the phone, we felt it is [facility].  He will be at risk for delirium and agitation in the hospital.  We will schedule Tylenol 1 g every 8 hours for the next 5 days then as needed.  Encourage incentive spirometry although this would be difficult with his severe dementia.  We will place TED hose for his orthostatic hypotension.   ? ?See individual problem list below for more on hospital course. ? ?Problems addressed during this hospitalization ?No problems updated. ?  ?Assessment and Plan: ?* Fall at nursing home/recurrent fall ?Patient with severe Parkinson dementia and orthostatic hypotension which could contribute to his fall.  Per wife, he also forgets to use his walker when he gets up to walk.  Orthostatic vitals- supine 173/85, sitting 144/78, standing 119/78, standing x 376ms 131/111, which is inconclusive.  ?-Apply TED hose ?-Recommend consistent use of rolling walker and a stepwise approach when he gets up and move ?-Recommend good hydration ?-He is DNR/DNI.  Suggest palliative care at ALF. ? ? ?Closed rib fracture ?Has right ninth and 10th rib fracture with no displacement.  No respiratory distress or pneumothorax.  Likely from his recent fall. ?-Continue encouraging incentive spirometry ?-Fall precaution ?-Scheduled Tylenol for the next 5 days followed by as  needed for pain control ? ?Near syncope/orthostatic hypotension ?Could be due to orthostatic hypotension.  No significant finding on EKG.  Basic labs reassuring. ?-Fall precaution, TED hose--- ? ?Parkinson's disease dementia ([place]) ?Continue home  Sinemet. ?Outpatient follow-up with his neurologist. ? ?This is addendum to discharge summary prepared last night for patient to go back to get for help.  Patient seen and examined.  Remains a stable.  Pain is controlled at rest.  He does have some pain on deep breathing which is expected from his rib fracture.  No other complications. ?Plan: Medically stable to go back to memory care unit.  Pain management with Tylenol 1 g every 8 hours for 5 days, lidocaine patches, will use Tessalon Perles to suppress cough to avoid exacerbation of pain. ?Continue deep breathing exercises and continue to work to improve mobility at memory care.  All-time fall precautions.  Stable for discharge. ? ?Vital signs ?Vitals:  ? 03/19/22 2000 03/20/22 4801 03/20/22 6553 03/20/22 7482  ?BP: (!) 143/72 126/77 (!) 149/73 134/81  ?Pulse: (!) 58 (!) 58 (!) 54 (!) 54  ?Temp:  98.4 ?F (36.9 ?C) 97.9 ?F (36.6 ?C) 98.1 ?F (36.7 ?C)  ?Resp:  17 17 18   ?SpO2: 90% 92% 92%   ?TempSrc:  Oral Oral Oral  ?  ? ?Discharge exam ? ?GENERAL: No acute distress.  Appears well.  ?HEENT: MMM.  Vision and hearing grossly intact.  ?NECK: Supple.  No apparent JVD.  ?RESP: 93% on RA.  No IWOB. Good air movement bilaterally. ?CVS:  RRR. Heart sounds normal.  ?ABD/GI/GU: Bowel sounds present. Soft.  Mild discomfort but no tenderness or rebound. ?MSK/EXT:  Moves extremities.  Mild tenderness over right chest laterally.  No bruising or ecchymosis.  No crepitus. ?SKIN: no apparent skin lesion or wound ?NEURO: Awake and alert.  Oriented only to self.  Follows some commands.  RLE resting tremor.  No gross deficit.  ?PSYCH: Calm. Normal affect.  ? ?Discharge Instructions ?Discharge Instructions   ? ? Diet general   Complete by: As directed ?  ? Discharge instructions   Complete by: As directed ?  ? It has been a pleasure taking care of you! ? ?You were hospitalized due to frequent falls.  We strongly recommend using your rolling walker and wearing the compression socks/TED  hose consistently.  Keep yourself hydrated at all times.  Use a stepwise approach when you get up and move.  ? ?In regards to broken ribs, continue deep breathing exercise.  May use Tylenol around-the-clock for pain for the next 5 days then as needed.  No surgery indicated. ? ? ?Take care,  ? Increase activity slowly   Complete by: As directed ?  ? ?  ? ?Allergies as of 03/20/2022   ? ?   Reactions  ? Cyclobenzaprine Other (See Comments)  ? off balance, multiple falls  ? ?  ? ?  ?Medication List  ?  ? ?TAKE these medications   ? ?acetaminophen 500 MG tablet ?Commonly known as: TYLENOL ?Take 2 tablets (1,000 mg total) by mouth every 8 (eight) hours for 5 days, THEN 2 tablets (1,000 mg total) every 8 (eight) hours as needed for up to 25 days. ?Start taking on: March 19, 2022 ?What changed: See the new instructions. ?  ?alum & mag hydroxide-simeth 200-200-20 MG/5ML suspension ?Commonly known as: MAALOX/MYLANTA ?Take 30 mLs by mouth every 6 (six) hours as needed for indigestion or heartburn. ?  ?aspirin 81 MG chewable tablet ?Chew 81 mg  by mouth daily. ?  ?benzonatate 100 MG capsule ?Commonly known as: TESSALON ?Take 1 capsule (100 mg total) by mouth 3 (three) times daily as needed for cough. ?  ?Calmoseptine 0.44-20.6 % Oint ?Generic drug: Menthol-Zinc Oxide ?Apply 1 application. topically in the morning, at noon, and at bedtime. Apply to buttocks ?  ?carbidopa-levodopa 25-100 MG tablet ?Commonly known as: SINEMET IR ?Take 1 tablet by mouth 4 (four) times daily. 0800, 1200, 1400, 2000 ?  ?Cyanocobalamin 1000 MCG/ML Kit ?Inject 1,000 mcg into the muscle every 30 (thirty) days. ?  ?DESITIN EX ?Apply 1 application. topically See admin instructions. Apply to scrotum once daily scheduled and as needed after each incontinence episode ?  ?guaiFENesin 100 MG/5ML liquid ?Commonly known as: ROBITUSSIN ?Take 200 mg by mouth every 6 (six) hours as needed for cough. ?What changed: Another medication with the same name was removed.  Continue taking this medication, and follow the directions you see here. ?  ?lidocaine 5 % ?Commonly known as: LIDODERM ?Place 1 patch onto the skin daily for 14 days. Remove & Discard patch within 12 hours or

## 2022-03-19 NOTE — Assessment & Plan Note (Signed)
Continue home Sinemet. ?Outpatient follow-up with his neurologist. ?

## 2022-03-19 NOTE — Progress Notes (Signed)
Dr. Logan Bores notified of the patient's arrival.  ?

## 2022-03-19 NOTE — Assessment & Plan Note (Signed)
Patient with severe Parkinson dementia and orthostatic hypotension which could contribute to his fall.  Per wife, he also forgets to use his walker when he gets up to walk.  Orthostatic vitals- supine 173/85, sitting 144/78, standing 119/78, standing x 49mns 131/111, which is inconclusive.  ?-Apply TED hose ?-Recommend consistent use of rolling walker and a stepwise approach when he gets up and move ?-Recommend good hydration ?-He is DNR/DNI.  Suggest palliative care at ALF. ? ?

## 2022-03-20 DIAGNOSIS — W19XXXA Unspecified fall, initial encounter: Secondary | ICD-10-CM | POA: Diagnosis not present

## 2022-03-20 DIAGNOSIS — S2241XA Multiple fractures of ribs, right side, initial encounter for closed fracture: Secondary | ICD-10-CM

## 2022-03-20 DIAGNOSIS — R410 Disorientation, unspecified: Secondary | ICD-10-CM | POA: Diagnosis not present

## 2022-03-20 DIAGNOSIS — Z743 Need for continuous supervision: Secondary | ICD-10-CM | POA: Diagnosis not present

## 2022-03-20 DIAGNOSIS — R4182 Altered mental status, unspecified: Secondary | ICD-10-CM | POA: Diagnosis not present

## 2022-03-20 DIAGNOSIS — R55 Syncope and collapse: Secondary | ICD-10-CM | POA: Diagnosis not present

## 2022-03-20 DIAGNOSIS — Y92129 Unspecified place in nursing home as the place of occurrence of the external cause: Secondary | ICD-10-CM | POA: Diagnosis not present

## 2022-03-20 MED ORDER — BENZONATATE 100 MG PO CAPS: 100.0000 mg | ORAL_CAPSULE | Freq: Three times a day (TID) | ORAL | 0 refills | Status: AC | PRN

## 2022-03-20 MED ORDER — LIDOCAINE 5 % EX PTCH: 1.0000 | MEDICATED_PATCH | CUTANEOUS | 0 refills | Status: AC

## 2022-03-20 NOTE — Progress Notes (Signed)
Patient seen and examined.  No overnight events.  Pain is controlled.  Pleasant but confused.  Discussed with family.  Discharge med rec reviewed, additional medications prescribed and patient is able to be discharged today.  Discharge summary updated. ? ?Total time spent: 25 minutes. ?Stable for discharge. ?

## 2022-03-20 NOTE — TOC Transition Note (Signed)
Transition of Care (TOC) - CM/SW Discharge Note ? ? ?Patient Details  ?Name: Kyle Mcguire ?MRN: [id number] ?Date of Birth: July 05, 1941 ? ?Transition of Care (TOC) CM/SW Contact:  ?Benard Halsted, LCSW ?Phone Number: ?03/20/2022, 11:32 AM ? ? ?Clinical Narrative:    ?Patient will DC to: [facility] ?Anticipated DC date: 03/20/22 ?Family notified: Spouse ?Transport by: Corey Harold ? ? ?Per MD patient ready for DC to [facility]. RN called report yesterday [phone]). RN, patient, patient's family, and facility notified of DC. Discharge Summary and FL2 sent to facility. DC packet on chart. Ambulance transport requested for patient.  ? ?CSW will sign off for now as social work intervention is no longer needed. Please consult [place] again if new needs arise. ? ? ? ? ?Final next level of care: Memory Care ?Barriers to Discharge: No Barriers Identified ? ? ?Patient Goals and CMS Choice ?Patient states their goals for this hospitalization and ongoing recovery are:: Return to facility ?CMS Medicare.gov Compare Post Acute Care list provided to:: Patient Represenative (must comment) ?Choice offered to / list presented to : Spouse ? ?Discharge Placement ?  ?           ?  ?Patient to be transferred to facility by: [facility] ?Name of family member notified: Spouse ?Patient and family notified of of transfer: 03/20/22 ? ?Discharge Plan and Services ?In-house Referral: Clinical Social Work ?  ?           ?  ?  ?  ?  ?  ?  ?  ?  ?  ?  ? ?Social Determinants of Health (SDOH) Interventions ?  ? ? ?Readmission Risk Interventions ?   ? View : No data to display.  ?  ?  ?  ? ? ? ? ? ?

## 2022-03-20 NOTE — TOC CAGE-AID Note (Signed)
Transition of Care (TOC) - CAGE-AID Screening ? ? ?Patient Details  ?Name: Kyle Mcguire ?MRN: [id number] ?Date of Birth: [birth year] ? ?Clinical Narrative: ? ?Patient with baseline dementia, prior history reflects no alcohol or drug use. Per wife this remains consistent with patient who does not use any substances. Patient not able to personally participate in screening. ? ?CAGE-AID Screening: ?Substance Abuse Screening unable to be completed due to: : Patient unable to participate (baseline dementia) ? ?  ?  ?  ?  ?  ? ?  ? ?  ? ? ? ? ? ? ?

## 2022-03-20 NOTE — Progress Notes (Signed)
[facility] here to transport pt back to [facility]. Pt alert and oriented to self only upon discharge. Pt's belongings taken with him. Wife called to make aware pt his now leaving.  ?

## 2022-03-20 NOTE — Care Management Obs Status (Signed)
MEDICARE OBSERVATION STATUS NOTIFICATION ? ? ?Patient Details  ?Name: Kyle Mcguire ?MRN: [id number] ?Date of Birth: [birth year] ? ? ?Medicare Observation Status Notification Given:  Yes ? ? ? ?Cyndi Bender, RN ?03/20/2022, 9:28 AM ?

## 2022-03-20 NOTE — Progress Notes (Signed)
Orthostatic V/S not taken due to patient inability to stand (very weak). ?

## 2022-03-24 DIAGNOSIS — G20 Parkinson's disease: Secondary | ICD-10-CM | POA: Diagnosis not present

## 2022-03-24 DIAGNOSIS — R69 Illness, unspecified: Secondary | ICD-10-CM | POA: Diagnosis not present

## 2022-03-24 DIAGNOSIS — Z7982 Long term (current) use of aspirin: Secondary | ICD-10-CM | POA: Diagnosis not present

## 2022-03-24 DIAGNOSIS — Z9182 Personal history of military deployment: Secondary | ICD-10-CM | POA: Diagnosis not present

## 2022-03-24 DIAGNOSIS — S2241XD Multiple fractures of ribs, right side, subsequent encounter for fracture with routine healing: Secondary | ICD-10-CM | POA: Diagnosis not present

## 2022-03-24 DIAGNOSIS — M6281 Muscle weakness (generalized): Secondary | ICD-10-CM | POA: Diagnosis not present

## 2022-03-24 DIAGNOSIS — G4752 REM sleep behavior disorder: Secondary | ICD-10-CM | POA: Diagnosis not present

## 2022-03-24 DIAGNOSIS — Z9181 History of falling: Secondary | ICD-10-CM | POA: Diagnosis not present

## 2022-03-24 DIAGNOSIS — H9190 Unspecified hearing loss, unspecified ear: Secondary | ICD-10-CM | POA: Diagnosis not present

## 2022-03-27 ENCOUNTER — Telehealth: Payer: Self-pay | Admitting: Neurology

## 2022-03-27 MED ORDER — CARBIDOPA-LEVODOPA 25-100 MG PO TABS: 1.0000 | ORAL_TABLET | Freq: Four times a day (QID) | ORAL | 2 refills | Status: DC

## 2022-03-27 NOTE — Telephone Encounter (Signed)
Done

## 2022-03-27 NOTE — Telephone Encounter (Signed)
Pt's wife is asking the carbidopa-levodopa (SINEMET IR) 25-100 MG tablet ?Be called into [facility] ph# [phone] fax#[phone] ?

## 2022-03-28 DIAGNOSIS — M6281 Muscle weakness (generalized): Secondary | ICD-10-CM | POA: Diagnosis not present

## 2022-03-28 DIAGNOSIS — Z9181 History of falling: Secondary | ICD-10-CM | POA: Diagnosis not present

## 2022-03-28 DIAGNOSIS — Z9182 Personal history of military deployment: Secondary | ICD-10-CM | POA: Diagnosis not present

## 2022-03-28 DIAGNOSIS — S2241XD Multiple fractures of ribs, right side, subsequent encounter for fracture with routine healing: Secondary | ICD-10-CM | POA: Diagnosis not present

## 2022-03-28 DIAGNOSIS — G4752 REM sleep behavior disorder: Secondary | ICD-10-CM | POA: Diagnosis not present

## 2022-03-28 DIAGNOSIS — R69 Illness, unspecified: Secondary | ICD-10-CM | POA: Diagnosis not present

## 2022-03-28 DIAGNOSIS — G20 Parkinson's disease: Secondary | ICD-10-CM | POA: Diagnosis not present

## 2022-03-28 DIAGNOSIS — H9190 Unspecified hearing loss, unspecified ear: Secondary | ICD-10-CM | POA: Diagnosis not present

## 2022-03-28 DIAGNOSIS — Z7982 Long term (current) use of aspirin: Secondary | ICD-10-CM | POA: Diagnosis not present

## 2022-03-31 DIAGNOSIS — Z9181 History of falling: Secondary | ICD-10-CM | POA: Diagnosis not present

## 2022-03-31 DIAGNOSIS — H9190 Unspecified hearing loss, unspecified ear: Secondary | ICD-10-CM | POA: Diagnosis not present

## 2022-03-31 DIAGNOSIS — Z66 Do not resuscitate: Secondary | ICD-10-CM | POA: Diagnosis not present

## 2022-03-31 DIAGNOSIS — G20 Parkinson's disease: Secondary | ICD-10-CM | POA: Diagnosis not present

## 2022-03-31 DIAGNOSIS — M6281 Muscle weakness (generalized): Secondary | ICD-10-CM | POA: Diagnosis not present

## 2022-03-31 DIAGNOSIS — K5900 Constipation, unspecified: Secondary | ICD-10-CM | POA: Diagnosis not present

## 2022-03-31 DIAGNOSIS — R69 Illness, unspecified: Secondary | ICD-10-CM | POA: Diagnosis not present

## 2022-03-31 DIAGNOSIS — G4752 REM sleep behavior disorder: Secondary | ICD-10-CM | POA: Diagnosis not present

## 2022-03-31 DIAGNOSIS — Z9182 Personal history of military deployment: Secondary | ICD-10-CM | POA: Diagnosis not present

## 2022-03-31 DIAGNOSIS — Z7982 Long term (current) use of aspirin: Secondary | ICD-10-CM | POA: Diagnosis not present

## 2022-03-31 DIAGNOSIS — S2241XD Multiple fractures of ribs, right side, subsequent encounter for fracture with routine healing: Secondary | ICD-10-CM | POA: Diagnosis not present

## 2022-04-01 DIAGNOSIS — H9190 Unspecified hearing loss, unspecified ear: Secondary | ICD-10-CM | POA: Diagnosis not present

## 2022-04-01 DIAGNOSIS — Z9181 History of falling: Secondary | ICD-10-CM | POA: Diagnosis not present

## 2022-04-01 DIAGNOSIS — M6281 Muscle weakness (generalized): Secondary | ICD-10-CM | POA: Diagnosis not present

## 2022-04-01 DIAGNOSIS — S2241XD Multiple fractures of ribs, right side, subsequent encounter for fracture with routine healing: Secondary | ICD-10-CM | POA: Diagnosis not present

## 2022-04-01 DIAGNOSIS — Z7982 Long term (current) use of aspirin: Secondary | ICD-10-CM | POA: Diagnosis not present

## 2022-04-01 DIAGNOSIS — G20 Parkinson's disease: Secondary | ICD-10-CM | POA: Diagnosis not present

## 2022-04-01 DIAGNOSIS — Z9182 Personal history of military deployment: Secondary | ICD-10-CM | POA: Diagnosis not present

## 2022-04-01 DIAGNOSIS — R69 Illness, unspecified: Secondary | ICD-10-CM | POA: Diagnosis not present

## 2022-04-01 DIAGNOSIS — G4752 REM sleep behavior disorder: Secondary | ICD-10-CM | POA: Diagnosis not present

## 2022-04-02 DIAGNOSIS — G4752 REM sleep behavior disorder: Secondary | ICD-10-CM | POA: Diagnosis not present

## 2022-04-02 DIAGNOSIS — Z9182 Personal history of military deployment: Secondary | ICD-10-CM | POA: Diagnosis not present

## 2022-04-02 DIAGNOSIS — G20 Parkinson's disease: Secondary | ICD-10-CM | POA: Diagnosis not present

## 2022-04-02 DIAGNOSIS — R69 Illness, unspecified: Secondary | ICD-10-CM | POA: Diagnosis not present

## 2022-04-02 DIAGNOSIS — H9190 Unspecified hearing loss, unspecified ear: Secondary | ICD-10-CM | POA: Diagnosis not present

## 2022-04-02 DIAGNOSIS — Z9181 History of falling: Secondary | ICD-10-CM | POA: Diagnosis not present

## 2022-04-02 DIAGNOSIS — M6281 Muscle weakness (generalized): Secondary | ICD-10-CM | POA: Diagnosis not present

## 2022-04-02 DIAGNOSIS — Z7982 Long term (current) use of aspirin: Secondary | ICD-10-CM | POA: Diagnosis not present

## 2022-04-02 DIAGNOSIS — S2241XD Multiple fractures of ribs, right side, subsequent encounter for fracture with routine healing: Secondary | ICD-10-CM | POA: Diagnosis not present

## 2022-04-08 DIAGNOSIS — H9190 Unspecified hearing loss, unspecified ear: Secondary | ICD-10-CM | POA: Diagnosis not present

## 2022-04-08 DIAGNOSIS — Z9182 Personal history of military deployment: Secondary | ICD-10-CM | POA: Diagnosis not present

## 2022-04-08 DIAGNOSIS — R69 Illness, unspecified: Secondary | ICD-10-CM | POA: Diagnosis not present

## 2022-04-08 DIAGNOSIS — G20 Parkinson's disease: Secondary | ICD-10-CM | POA: Diagnosis not present

## 2022-04-08 DIAGNOSIS — S2241XD Multiple fractures of ribs, right side, subsequent encounter for fracture with routine healing: Secondary | ICD-10-CM | POA: Diagnosis not present

## 2022-04-08 DIAGNOSIS — Z7982 Long term (current) use of aspirin: Secondary | ICD-10-CM | POA: Diagnosis not present

## 2022-04-08 DIAGNOSIS — Z9181 History of falling: Secondary | ICD-10-CM | POA: Diagnosis not present

## 2022-04-08 DIAGNOSIS — M6281 Muscle weakness (generalized): Secondary | ICD-10-CM | POA: Diagnosis not present

## 2022-04-08 DIAGNOSIS — G4752 REM sleep behavior disorder: Secondary | ICD-10-CM | POA: Diagnosis not present

## 2022-04-09 DIAGNOSIS — Z9182 Personal history of military deployment: Secondary | ICD-10-CM | POA: Diagnosis not present

## 2022-04-09 DIAGNOSIS — G20 Parkinson's disease: Secondary | ICD-10-CM | POA: Diagnosis not present

## 2022-04-09 DIAGNOSIS — H9190 Unspecified hearing loss, unspecified ear: Secondary | ICD-10-CM | POA: Diagnosis not present

## 2022-04-09 DIAGNOSIS — Z7982 Long term (current) use of aspirin: Secondary | ICD-10-CM | POA: Diagnosis not present

## 2022-04-09 DIAGNOSIS — Z9181 History of falling: Secondary | ICD-10-CM | POA: Diagnosis not present

## 2022-04-09 DIAGNOSIS — M6281 Muscle weakness (generalized): Secondary | ICD-10-CM | POA: Diagnosis not present

## 2022-04-09 DIAGNOSIS — R69 Illness, unspecified: Secondary | ICD-10-CM | POA: Diagnosis not present

## 2022-04-09 DIAGNOSIS — G4752 REM sleep behavior disorder: Secondary | ICD-10-CM | POA: Diagnosis not present

## 2022-04-09 DIAGNOSIS — S2241XD Multiple fractures of ribs, right side, subsequent encounter for fracture with routine healing: Secondary | ICD-10-CM | POA: Diagnosis not present

## 2022-04-10 DIAGNOSIS — S2241XD Multiple fractures of ribs, right side, subsequent encounter for fracture with routine healing: Secondary | ICD-10-CM | POA: Diagnosis not present

## 2022-04-10 DIAGNOSIS — Z9182 Personal history of military deployment: Secondary | ICD-10-CM | POA: Diagnosis not present

## 2022-04-10 DIAGNOSIS — Z9181 History of falling: Secondary | ICD-10-CM | POA: Diagnosis not present

## 2022-04-10 DIAGNOSIS — M6281 Muscle weakness (generalized): Secondary | ICD-10-CM | POA: Diagnosis not present

## 2022-04-10 DIAGNOSIS — H9190 Unspecified hearing loss, unspecified ear: Secondary | ICD-10-CM | POA: Diagnosis not present

## 2022-04-10 DIAGNOSIS — G4752 REM sleep behavior disorder: Secondary | ICD-10-CM | POA: Diagnosis not present

## 2022-04-10 DIAGNOSIS — G20 Parkinson's disease: Secondary | ICD-10-CM | POA: Diagnosis not present

## 2022-04-10 DIAGNOSIS — R69 Illness, unspecified: Secondary | ICD-10-CM | POA: Diagnosis not present

## 2022-04-10 DIAGNOSIS — Z7982 Long term (current) use of aspirin: Secondary | ICD-10-CM | POA: Diagnosis not present

## 2022-04-11 DIAGNOSIS — R69 Illness, unspecified: Secondary | ICD-10-CM | POA: Diagnosis not present

## 2022-04-11 DIAGNOSIS — Z9181 History of falling: Secondary | ICD-10-CM | POA: Diagnosis not present

## 2022-04-11 DIAGNOSIS — M6281 Muscle weakness (generalized): Secondary | ICD-10-CM | POA: Diagnosis not present

## 2022-04-11 DIAGNOSIS — Z7982 Long term (current) use of aspirin: Secondary | ICD-10-CM | POA: Diagnosis not present

## 2022-04-11 DIAGNOSIS — H9190 Unspecified hearing loss, unspecified ear: Secondary | ICD-10-CM | POA: Diagnosis not present

## 2022-04-11 DIAGNOSIS — G20 Parkinson's disease: Secondary | ICD-10-CM | POA: Diagnosis not present

## 2022-04-11 DIAGNOSIS — G4752 REM sleep behavior disorder: Secondary | ICD-10-CM | POA: Diagnosis not present

## 2022-04-11 DIAGNOSIS — Z9182 Personal history of military deployment: Secondary | ICD-10-CM | POA: Diagnosis not present

## 2022-04-11 DIAGNOSIS — S2241XD Multiple fractures of ribs, right side, subsequent encounter for fracture with routine healing: Secondary | ICD-10-CM | POA: Diagnosis not present

## 2022-04-22 ENCOUNTER — Telehealth: Payer: Self-pay | Admitting: Neurology

## 2022-04-22 NOTE — Telephone Encounter (Signed)
Pt's wife, Quintavious Rinck notifying of new insurance  ?[employer] ?Member ID: [id number] ?RxGroup# [id number] ?BIN# [id number] ?PCN# [id number] ?

## 2022-07-21 ENCOUNTER — Other Ambulatory Visit: Payer: Self-pay | Admitting: *Deleted

## 2022-07-21 ENCOUNTER — Telehealth: Payer: Self-pay | Admitting: Neurology

## 2022-07-21 MED ORDER — CARBIDOPA-LEVODOPA 25-100 MG PO TABS: 1.0000 | ORAL_TABLET | Freq: Four times a day (QID) | ORAL | 1 refills | Status: DC

## 2022-07-21 NOTE — Telephone Encounter (Signed)
Pt's wife, Treyvone Chelf request refill for carbidopa-levodopa (SINEMET IR) 25-100 MG tablet at  [facility],  [place], [place] [address] Phone:  [phone]

## 2022-07-21 NOTE — Telephone Encounter (Addendum)
Spoke to wife (checked DPR) Informed wife of refill on Carbidopa -levodopa  . Made  annual appointment with Dr Jaynee Eagles  11/2022  wife  thanked me for calling .

## 2022-09-28 IMAGING — DX DG HIP (WITH OR WITHOUT PELVIS) 2-3V*R*
3 series · 3 of 3 positions shown · non-contrast
Comparison: None.

CLINICAL DATA: Frequent falls, right hip pain.

EXAM:
DG HIP (WITH OR WITHOUT PELVIS) 2-3V RIGHT

[pelvis ap]
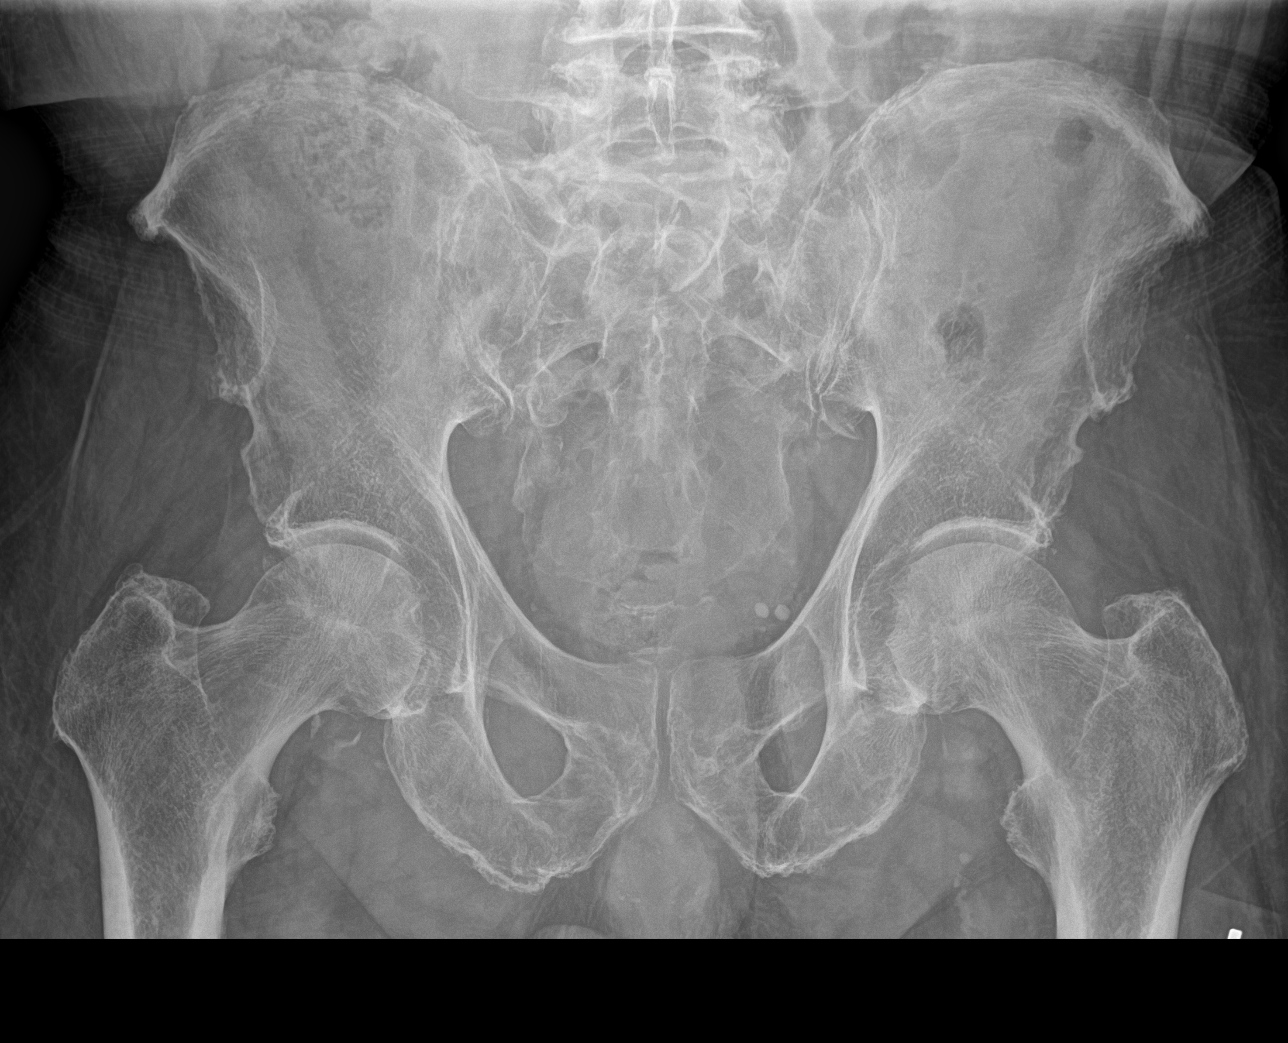

[hip ap]
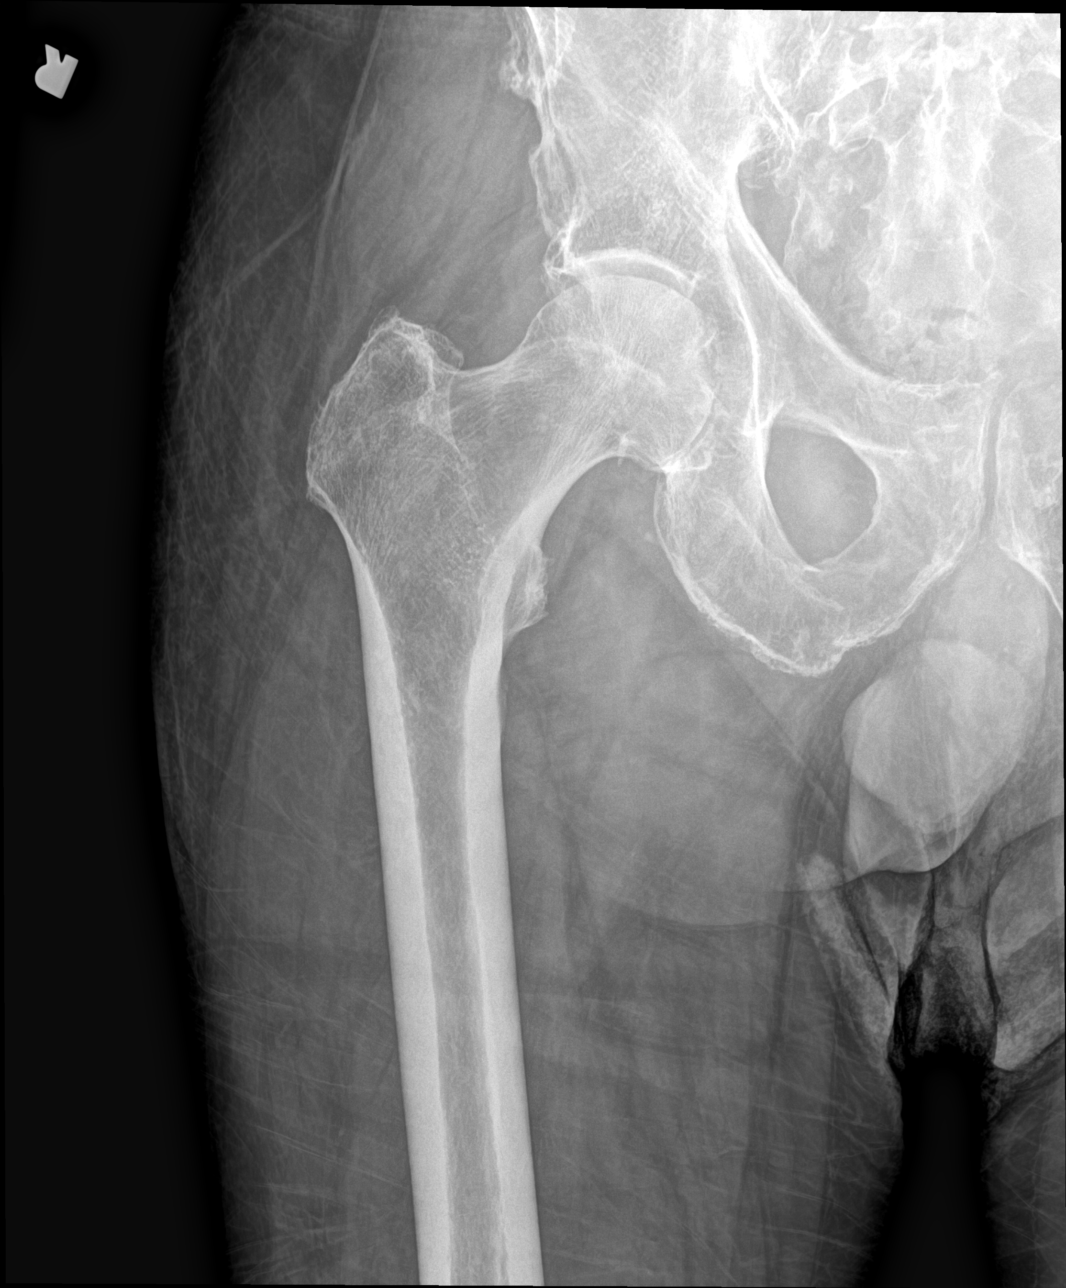

[hip lat]
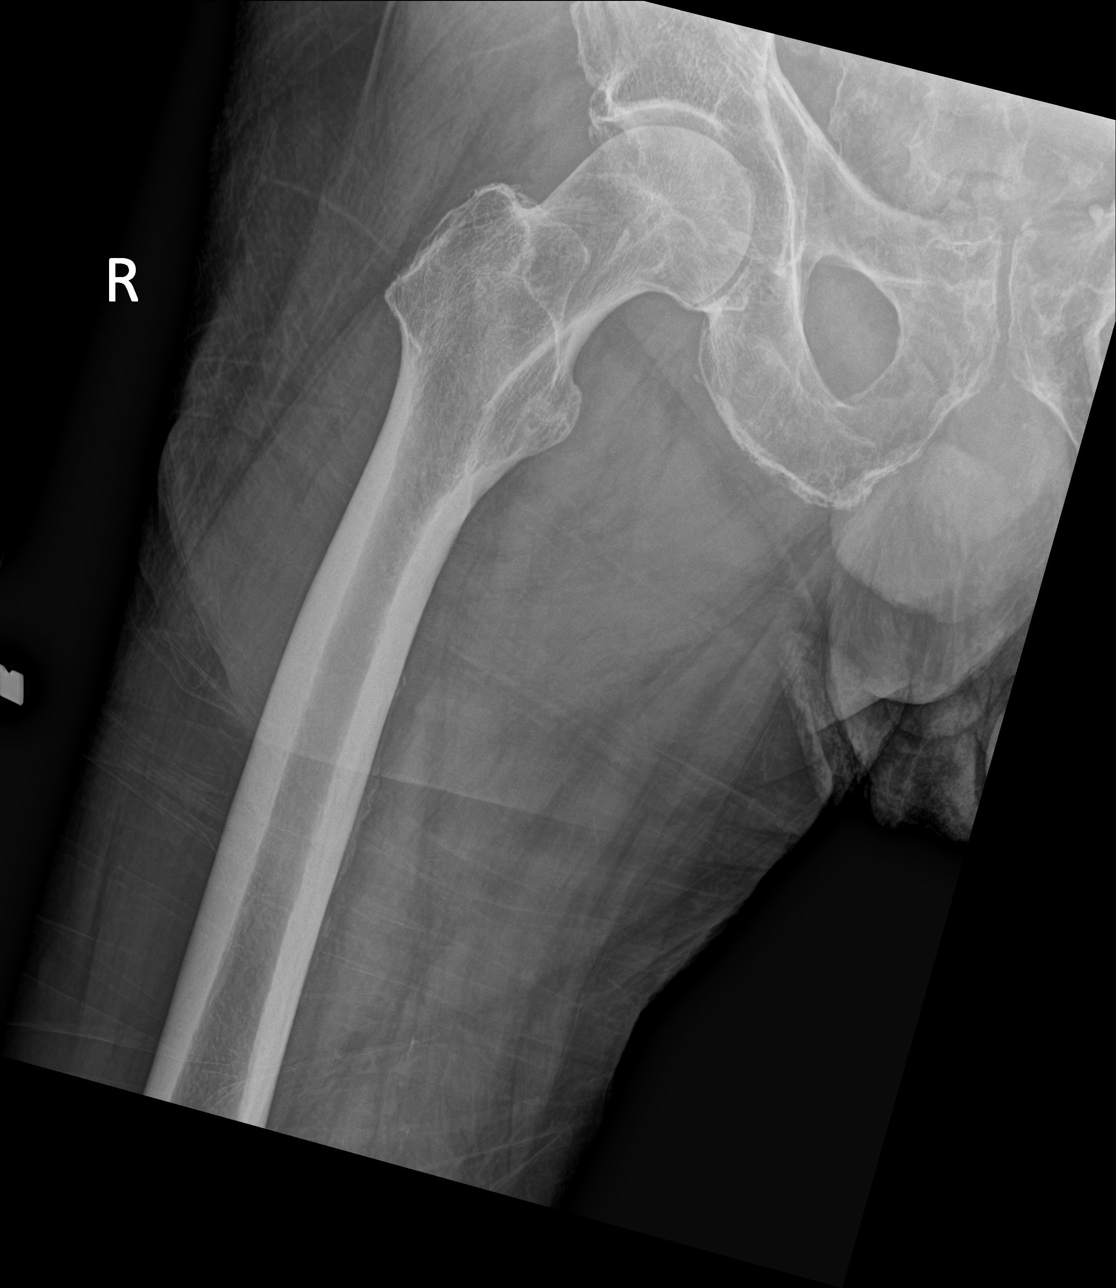

[3 of 3 positions shown; findings below may reference images not displayed]

FINDINGS: There is no evidence of hip fracture or dislocation. No AP evidence
of pelvic fracture or diastasis. There is mild spurring of the SI
joints, enthesopathic spurring of the pelvis and mild symmetric hip
DJD.

There are scattered pelvic phleboliths. Degenerative disc disease
and spondylosis in the visualized lower lumbar spine.
IMPRESSION: Osteopenia and degenerative change without evidence of fractures.

## 2022-09-28 IMAGING — CT CT HEAD W/O CM
4 series · 16 of 47 positions shown, 18 images · non-contrast
Comparison: 11/25/2021

CLINICAL DATA: Multiple falls.



[Series 2: head bone · axial · 0.43mm/px · z∈[-98,-42]mm · 4 of 85 slices shown]
[im 9/85  bone]
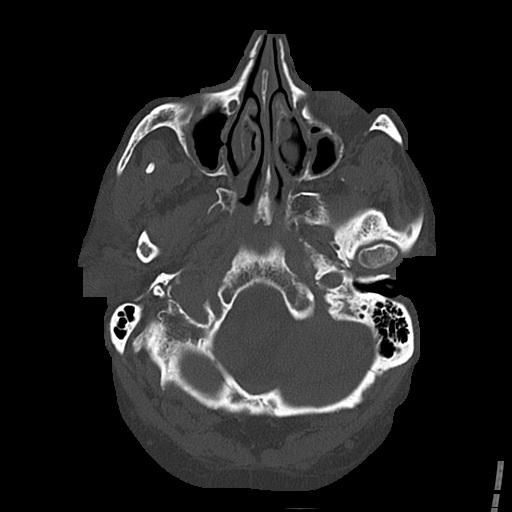
[im 17/85  bone]
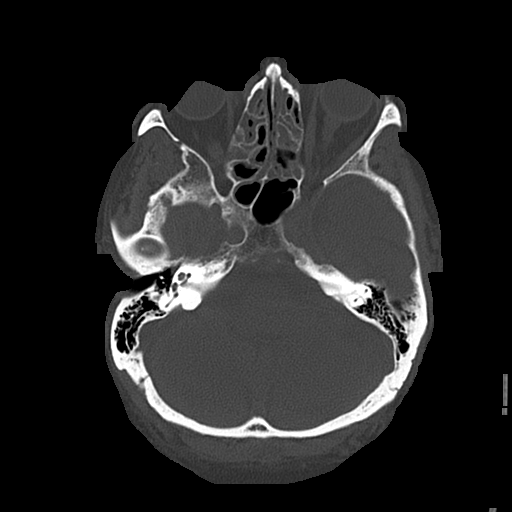
[im 29/85  bone]
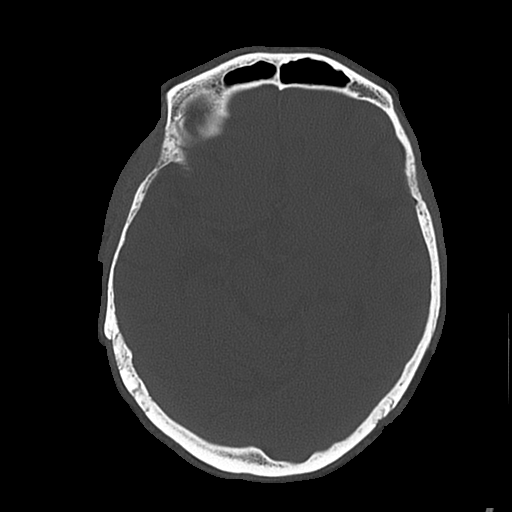
[im 37/85  bone]
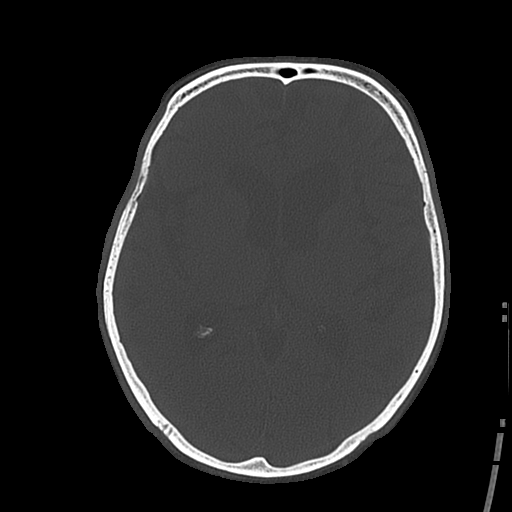

[Series 3: head wo ax · axial · 0.33mm/px · z∈[-55,+52]mm · 6 of 33 slices shown, 8 images]
[im 5/33  brain]
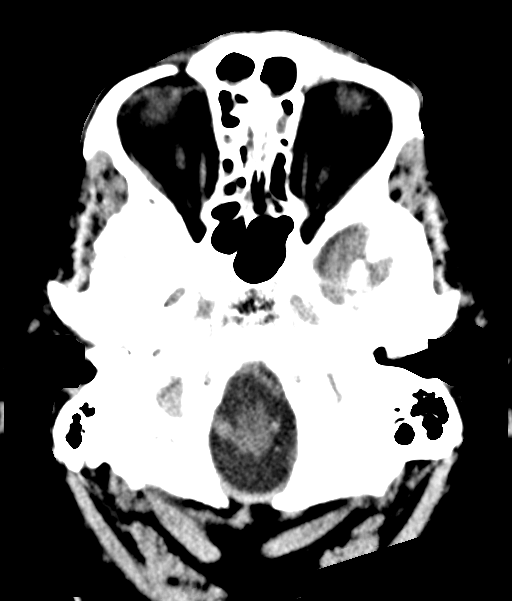
[im 5/33  bone]
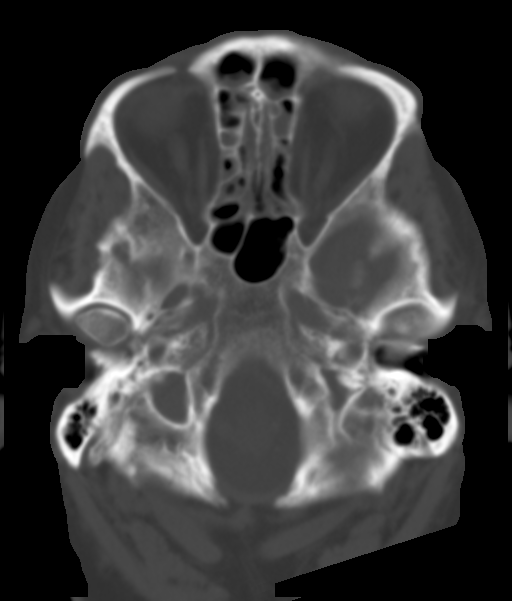
[im 10/33  brain]
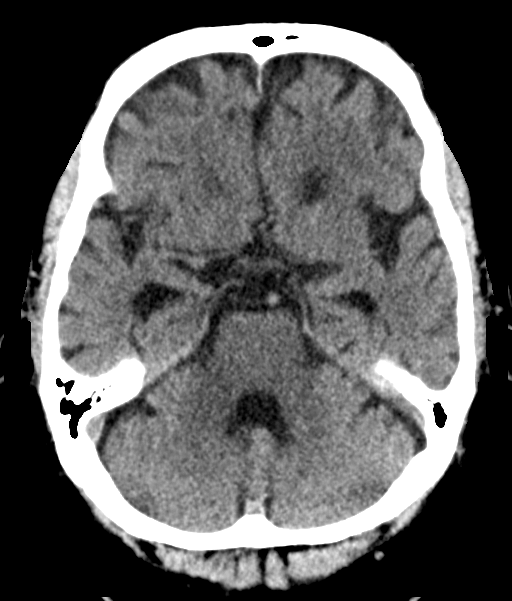
[im 14/33  brain]
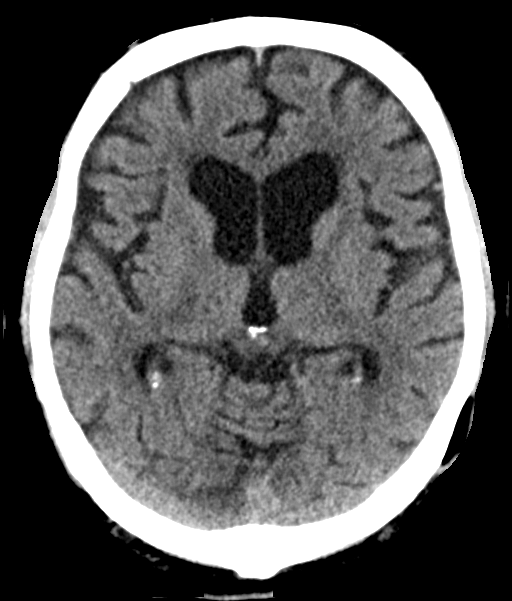
[im 19/33  brain]
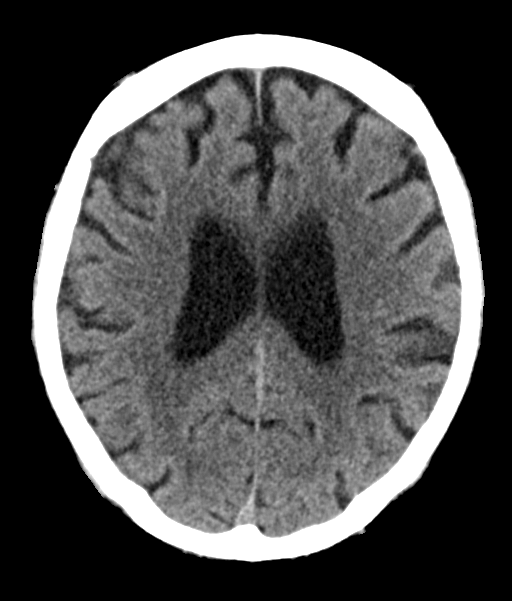
[im 23/33  brain]
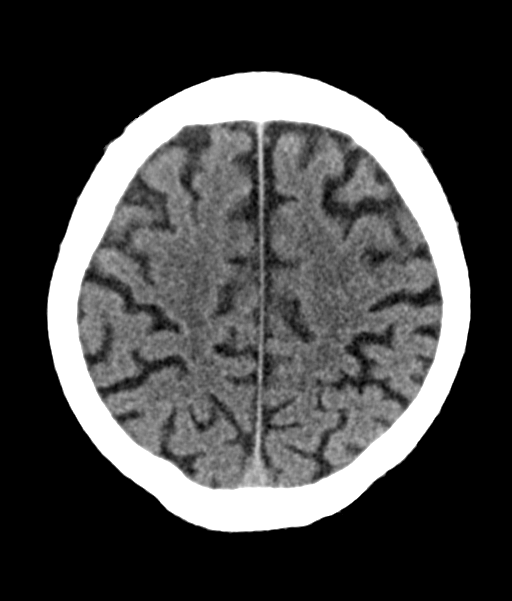
[im 23/33  bone]
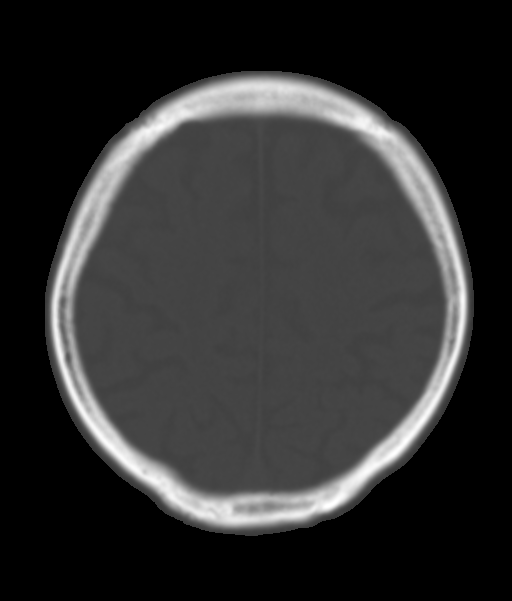
[im 28/33  brain]
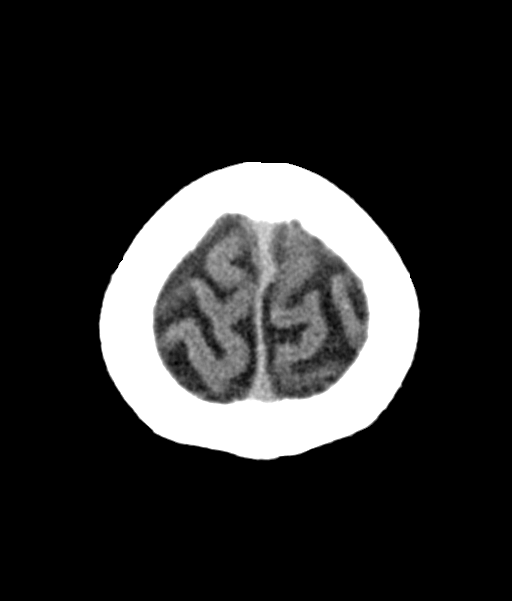

[Series 4: coronal soft · coronal · 0.32mm/px · 3 of 66 slices shown]
[im 22/66  brain]
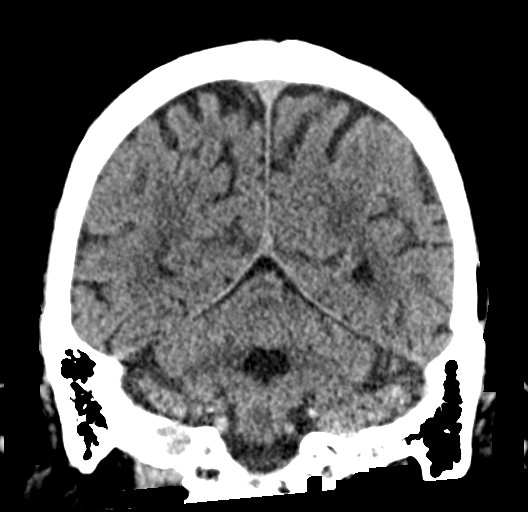
[im 29/66  brain]
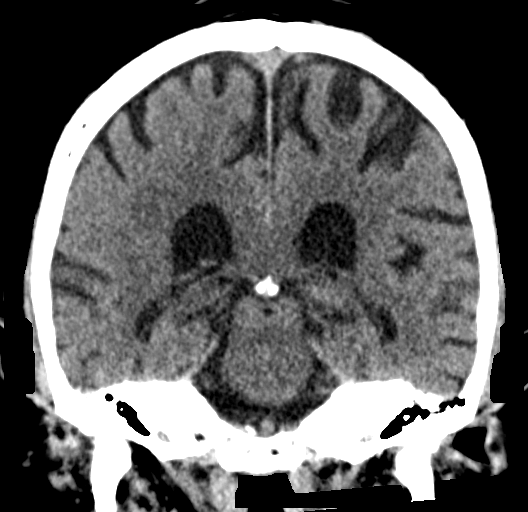
[im 37/66  brain]
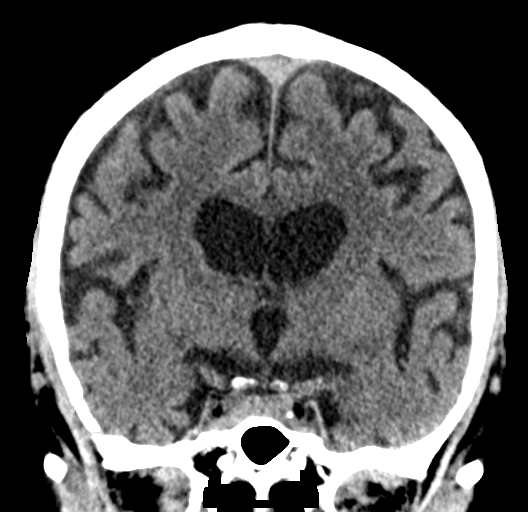

[Series 5: sagittal soft · sagittal · 0.32mm/px · 3 of 56 slices shown]
[im 19/56  brain]
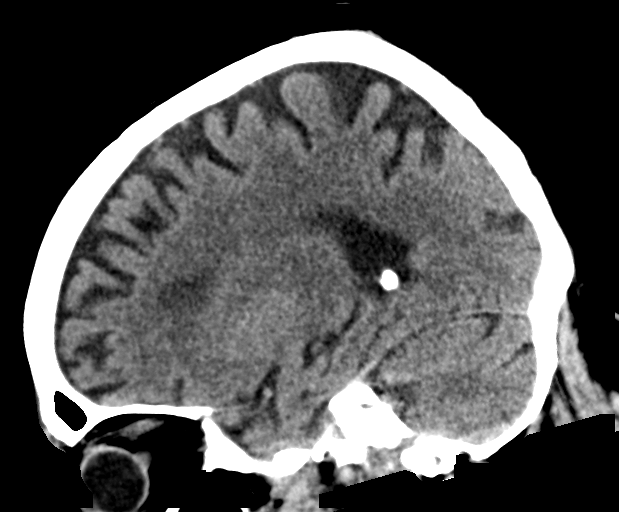
[im 28/56  brain]
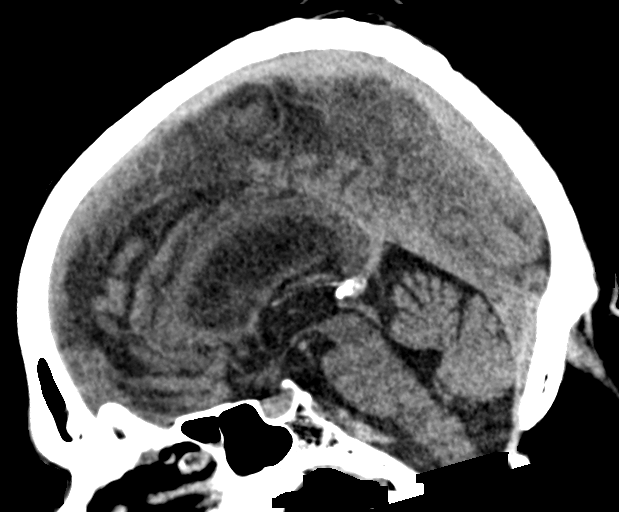
[im 37/56  brain]
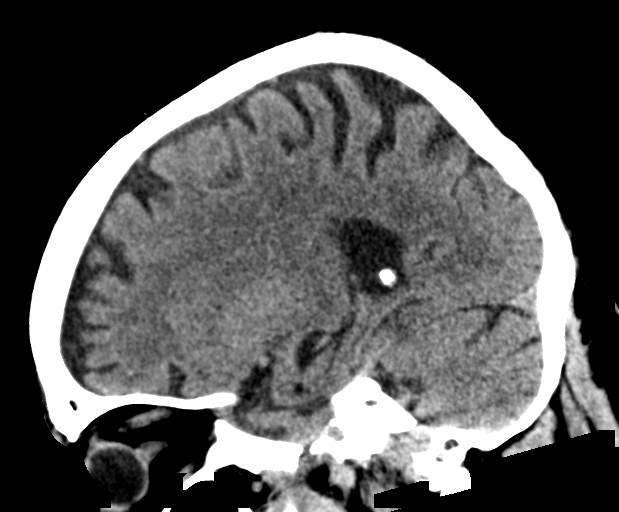

[16 of 47 positions shown; findings below may reference images not displayed]

FINDINGS: Brain: No acute intracranial abnormality. Specifically, no
hemorrhage, hydrocephalus, mass lesion, acute infarction, or
significant intracranial injury.

Vascular: No hyperdense vessel or unexpected calcification.

Skull: No acute calvarial abnormality.

Sinuses/Orbits: Diffuse mucosal thickening.  No air-fluid levels.

Other: None
IMPRESSION: No acute intracranial abnormality.

Chronic sinusitis.

## 2022-11-19 ENCOUNTER — Telehealth: Payer: Self-pay | Admitting: Neurology

## 2022-11-19 NOTE — Telephone Encounter (Signed)
Patient has an appointment 12/14 and wife said he is in a memory care facility "[facility]".She wanted to know if Dr. Jaynee Eagles thinks he needs to be seen and she will keep the appointment. He also has Dementia as well and is ok with bringing him to the visit based on her recommendation.If you need me to call her back I can based on what you advise. Thank you

## 2022-11-20 NOTE — Telephone Encounter (Signed)
I know it is hard for them to get here and we are starting to remask due to covid/rsv/flu.  we can do a telephone call like before instead?

## 2022-11-20 NOTE — Telephone Encounter (Signed)
I called the patient's wife back and offered telephone visit with patient and Dr Jaynee Eagles. She was very appreciative and accepted. Appt date/time remains 12/14 at 11:00 AM. Her questions were answered. She said she got a message ahead of time telling her what the cost would be and she wanted to know if the telephone visit cost was the same as in-office. I told her I would send a message to billing to see.

## 2022-11-25 NOTE — Telephone Encounter (Signed)
Spoke with pt's wife. She will call her insurance regarding the copay and will get back to [place]. Her questions were answered. She was appreciative for the call.

## 2022-11-27 ENCOUNTER — Ambulatory Visit (INDEPENDENT_AMBULATORY_CARE_PROVIDER_SITE_OTHER): Payer: PPO | Admitting: Neurology

## 2022-11-27 DIAGNOSIS — F0280 Dementia in other diseases classified elsewhere without behavioral disturbance: Secondary | ICD-10-CM

## 2022-11-27 DIAGNOSIS — I951 Orthostatic hypotension: Secondary | ICD-10-CM | POA: Diagnosis not present

## 2022-11-27 DIAGNOSIS — G20A2 Parkinson's disease without dyskinesia, with fluctuations: Secondary | ICD-10-CM

## 2022-11-27 DIAGNOSIS — G20A1 Parkinson's disease without dyskinesia, without mention of fluctuations: Secondary | ICD-10-CM | POA: Diagnosis not present

## 2022-11-27 DIAGNOSIS — R0683 Snoring: Secondary | ICD-10-CM | POA: Diagnosis not present

## 2022-11-27 MED ORDER — CARBIDOPA-LEVODOPA 25-100 MG PO TABS: 1.0000 | ORAL_TABLET | Freq: Four times a day (QID) | ORAL | 1 refills | Status: AC

## 2022-11-27 NOTE — Progress Notes (Addendum)
[facility]    Provider:  Dr Jaynee Eagles Referring Provider: No ref. provider found Primary Care Physician:  Vernie Shanks, MD (Inactive)  Virtual Visit via Telephone Note  I connected with Kyle Mcguire on 11/27/22 at 11:00 AM EST by telephone and verified that I am speaking with the correct person using two identifiers.  Location: Patient: home Provider: office   I discussed the limitations, risks, security and privacy concerns of performing an evaluation and management service by telephone and the availability of in person appointments. I also discussed with the patient that there may be a patient responsible charge related to this service. The patient expressed understanding and agreed to proceed.   Follow Up Instructions:    I discussed the assessment and treatment plan with the patient. The patient was provided an opportunity to ask questions and all were answered. The patient agreed with the plan and demonstrated an understanding of the instructions.   The patient was advised to call back or seek an in-person evaluation if the symptoms worsen or if the condition fails to improve as anticipated.  I provided 22 minutes of non-face-to-face time during this encounter.   Kyle Beam, MD  CC:  PD   In a new facility [facility]. Very happy there. He drinks the ice tea not water, would non caffeinated and if he drinks ice tea that's fine just as long we get fluids in him. He keeps a fridge stocked with gatorade. No dizziness. Balance is off, fallen quite a bit but not in the last few months, advised to use a walking aid. He gets up on his own when he isn't supposed and she has talked to the staff about it. They visit him every day. He had speech therapy and gets therapy in the facility. They decline PT. In the process of getting hospice on board. No pain. He is doing surprisingly well. He is happy and content.   Lightweight wheelchair: Patient  was evaluated for a condition that supports the need for a wheelchair, patient has a mobility limitation that impairs her ability to do 1 or more MR ADLs in a customary location in the home due to his Parkinson's disease and dementia, patient cannot use a cane crutches or walker to resolve the issue sufficiently due to gait and balance, patient can safely self propel the wheelchair in the home and also has a caregiver that can provide assistance, patient cannot push themselves in a heavier, standard weight wheelchair in the home.  Patient has shown that they can self propel in a lightweight wheelchair.  Patient cannot ambulate unassisted without risk of fall.   Patient complains of symptoms per HPI as well as the following symptoms: falls . Pertinent negatives and positives per HPI. All others negative   12/12/2021: Dr. Jaynee Eagles December 12, 2021: Patient with a history of Parkinson's disease, Parkinson's disease with dementia, hearing loss, REM sleep behavior disorder, orthostatic hypotension, falls with recent admissions for multiple falls last was within the last month which required sutures to his head.  He has been seen at the emergency room in August of this year as well as November and December of this year.  After his admission in August he had occupational and physical and speech therapy.  Also been to a rehab center in August followed by the therapy in September.  In November he fell also in December. He is going to [place] for long term care, [place] and being discharged to [facility] for long term. He can use a walker. He has not been getting dizzy. He goes to PT, mostly bound to wheelchair. The aricept was taken off. No problems sleeping. He has hallucintaions and delusions that bother him.  Spoke to son about PD, UTIs, environmental factors, hospice, delusions, hallucinations, and answered all questions. [name] 07/22/21 ALL:  Kyle presents, today, for an acute  care visit following ER evaluation 07/18/2021 for weakness. Mr Mcguire is seen by [place] for PD with associated dementia. He has continued carb/levo 1 tablet QID. He was prescribed rivastigmine 1.51m tablet BID (switched from Exelon patch in 03/2021 due to expense). Wife reports she was unable to afford oral tablet and restarted him on donepezil (used old bottle, we were unaware). Up until two weeks ago, he was going to kick boxing with [employer] a week.   He has been experiencing waxing and waning cognition and dizziness for the past 6-12 months. His wife states that he was seen by PCP on 07/15/2021 for worsening swelling of lower extremities. PCP started him on Lasix 268mdaily and updated blood work. They advised she call [place] in appt due to dizziness. On 07/18/2021, he was no better prompting her to call EMS. ER evaluation with chest xray, MRI and labs was unremarkable. He was discharged home after IV fluids. He was assisted to car by 2 nurses and his wife, per her report. She was able to get him home but reported continued worsening 8/5 and 8/6. She called EMS for evaluation 8/6 and was advised that vital signs were normal and ER evaluation not recommended.   Today, she called the office to report that she could not get him out of the bed. At 5:30am she went to answer his call to use the restroom and he had a very difficult time getting out of the bed. It took her 10 minutes to get him up and to the restroom. He is much more sleepy than normal. He denies pain. Mrs CaBauereports that he complains of being cold. He has not had a temperature. She performed home Covid test at some point over the past week and it was negative. She has not repeated test. No known exposure. He is vaccinated. He seems to be eating fairly normally but she is having to spoon feed him. He can hold a sandwich but unable to operate utensils. He is not drinking much fluids. He usually has about 4 ounces with medications 4 times daily  and at best has about 8 ounces with dinner. He is not as sharp from cognitive standpoint. He is more confused than normal. No specific falls but she reports that he has stumbled and fell against furniture many times over the past week. She has to reposition him in a chair.   He continues carb/levo 8am, 12pm, 4pm, 8pm. There is no correlation with timing of medication and symptoms. She reports that weakness if generalized and present all day. Weakness is progressively and significantly worse over the past week. She does not feel she can care for him at home. She called the fire department to get him to his appt, today.   04/11/2021 ALL:  DoORVILLE MENAs a [age].o. male here today for follow up for PD with dementia. He continues Sinemet QID and Exelon 4.30m23maily. He feels that balance is worsening. He has a cane but does not use. He worked with PT years ago  but did not feel it was helpful. No pain. He continues kickboxing twice a week. Memory seems to be declining. He has a harder time finding words. He is able to perform ADLs. He is eating normally. He is sleeping more. He sets reminder on his phone to help with medication administration. Insurance no longer covering Exelon patch. He has tried and failed Aricept. Aricept caused significant diarrhea.  05/16/2020: No complaints of dizziness. His wife provided much information. He is going for b12 shots and feeling better. He is getting injections. No falls, new symptoms, stable. He is even behaving himself. He is walking every day now, little walks. He is doing well. Stable with sinemet. Taking the Exelon Patch. He was having back problems, he has degenerative disk disease, he is in physical therapy, he has been to [facility]. He will restart the [facility] after that. He says he is feeling well, he is walking, he is going to PT, he feels stable, no falls. He mows his son's lawn weekly.   12/13/2019; Wife says she has to help him with a lot more things. He is  very mellow, good mood. He doesn't complain of anything. She hs to give him cues to do things like brush teeth. No pain. He still is not very active. He does however take a short walk every day. He drinks pepsi daily, he also drinks a lot of flavored water. Try to get low cal and pepsi no caffeine. Discussed with wife in detail. He is still dizzy but it is better. He has started falling, balance is deteriorating. We will have PT to his house for walking assistance training and balance    Interval history 03/16/2019: Spoke with wife and patient. They are feeling well. They are staying in the house. No falls. Tremor has not worsened. They have a timer on his phone. He has not been dizzy, stopped drinking pepsi totally, no dizziness or lightheadedness. He drinks water and sparkling water. No swallowing problems. He has lost weight but this is good. He is going to rock steady 2x a week. Memory stable. Still driving, no accidents. Aricept causing diarrhea will try an exelon patch.  Interval history 09/13/2018: Lovely gentleman here with his wife for follow up of PD.  He was seen by Dr. Marcos Eke who provided much information on his condition and also recommendations such as activities and exercise. 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 Will request they meet with the social worker at [facility]. He goes ro [employer] 2x a week which is new and excellent.  He has not been dizzy lately, discussed hydration. No recent falls - 5-6 months ago he fell but none since . No swallowing difficulties.  No difficulties with the Sinemet. Tremors are stable, not bothersome. He is stable, does not want to change any management, will keep om the same dose of Sinemet. He is drinking more sprakling water and no pepsi and he likes it. No problems swallowing. Starting to have back problems. He is inactive most days except the boxing now 2x a week. He was dxed with REM sleep disorder but it doesn't affect him. They bowl every Monday.     Interval history 08/25/2017: Had falls when on Flexeril but none since then because it made him dizzy. Still having dizziness. Memory changes started slowly 3-4 years ago and slowly progressive. Started with short-term memory issues. He would forget sometimes which road to take, little things. But not progressive and wosening. Wife has been  managing his medications "for a while". Confusion worsens later in the day.He used to pay the bills but now his wife pays for them. Wife makes out the checks because of the shaking. Wife found bills in the past that were not paid. He is less social. He doesn't remember their anniversary anymore, he used to all the time. He is in a different room because of the snoring, he nods off a lot during the day. Wife is here and provides all information. He gets up and has breakfast, he broses through the paper and then he lays on the couch until 11am nodding off. He sits in the recliner all afternoon. Wife feels that he is developing dementia and has had a change in personality.has changed, he used to be very active, he used to work part time. He does take care of the laundry and cleans up after dinner. They started bowling, wife is trying to get him out more often.He is anxious and worrying a lot.He worries a lot. He has mellowed which is good. No hallucinations.     Interval history: He feels like the dizziness has improved. Discussed northera. He feels that the dizziness is not too significant. We tried Florinef but his bllod pressure was too elevated. He last took the Sinemet at 7 this morning and he has no tremor, then he takes it at noon and then at 7pm and he goes to sleep at 10pm. He feels under control. No risk of fallin gfrom the dizziness, he has been drinking more water and feels better and less dizzy. His blood pressures from the last month have ranged 120-143/ 77-89 which is excellent. Hydrating more. One appointment was 101/82.  3-4 glasses of water a day and lemonade  as well. No swallowing issues. No falls.    HPI:  Kyle Mcguire is a wonderful 81 y.o. male here as a follow up for parkinson's disease. He is doing on Sinemet. 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 He was started on Florinef at last appointment and comes in today with his wife for the first time to discuss.    Interval history 09/25/2016:  Patient comes in with wife today, this the first time I'm meeting her. We reviewed the patient's history and the medications we have tried. Patient's tremor responds to Sinemet. In the past we had him on 3 pills a day and patient felt as though the Effexor good but he felt them wearing off during the day. We increased him to 4 pills. Discussed the proper way to take it, without protein as this might interfere with absorption. Also advised he can take the pills every 3-4 hours. I asked his wife to notice when she sees the tremor improved after taking medication and if she notices the medications wearing off. Also had a long discussion about hydration, patient drinks several Cokes daily but does not drink much fluid. I advised that this may be what is causing his dizziness when he stands up. Orthostatic hypotension can also be seen in  Parkinson's disease or caused by Sinemet. Asked them to keep a fluids diary. The Florinef is making his blood pressure too high advised him to stop it.   Interval history 08/2016: Tremor is good. He is dizzy all the time. Things are blurry when he looks at them. He feels dizzy even now when sitting. His orthostatics have been normal. He takes the carbidopa/levodopa 4 x a day. He last took the medication at 8am this  morning and his tremor is fine. Will go back to 3 pills of Sinemet a day and see if this improved his dizziness. if needed, florinef 0.1, 1/2 pill up to bid for starters   Interval history 01/2016: He is on 1 tab 4 times a day every 4 hours. His tremor is better. He takes the  medication around 8am he feels the tremor about 10 - 10:30. Discussed that can take it every 3 hours instead. He feels the medication wearing off. However very mild tremor right now and he took his pill at 8am. He is having dizziness all the time. That started a month ago. There was no change in medication a month ago. Worse when moving. He is not drinking enough water. In the morning he has coffee. At lunch time he has a sandwhich or half a sandwich and lemonade. And a full glass of water with his supper. No room spinning. He is lightheaded, no CP, no SOB. No falls. No dysphagia. Not with sitting or laying. More upon standing and ambulating.    Interval history 07/2015: He took the sinemet this morning at 7:30am. The tremor is better right now. The sinemet is helping it, it just wears off.  Not hard for him to take it 3x a day. Once in a while he forgets. He doesn't know how long the sinemet lasts. No dizziness when he stands up. Not lightheaded, no symptoms fron the sinemet. Right now at this moment he is happy with his tremor, last took the Sinemet a few hours ago. He gets up at 7:30 amd walks to sheets. He goes to bed between 10 and11pm. Feels like his voice is getting lower. No problems with sleeping or vivid dreams. No falls. Sense of smell and taste is ok. No dysphagia. No memory changes. No hallucinations. No falls. No drooling. Noticed slight tremor in the right hand by the end of the appointment.     Vould try taking the Sinemet every 4 hours. 8am, noon, 4pm and 8pm. In the morning his handwriting is worse but he takes the sinemet and that is fine, he doesn't need a long-acting at night. But right    Interval update 04/26/2015: He stopped the Sinemet 2 weeks ago. Says it didn't help. Explained that we may need to increase it. Discussed in depth the plan to slowly increase the Sinemet, that this is medication for PD but we need to increase slowly. If it didn't help, dose was too low and we had plans to  increase.  No side effects.       Visit 3/3:  GARFIELD COINER is a 81 y.o. male here as a follow up.    Azilect not helping with the tremor and making him dizzy. Otherwise symptoms are the same.    Reviewed notes, labs and imaging from outside physicians, which showed:   MR Brsain 12/2014:  This is a borderline normal MRI of the brain without contrast showing a mild extent of white matter foci that are likely due to age-related microvascular change. There is associated minimal atrophy.  There are no acute findings.   Initial visit 12/27/2014:  ZALE MARCOTTE is a 81 y.o. male with no significant PMHx who is here as a consult from Dr. Jacelyn Grip for Tremors. Tremors started 8-9 months ago. Started in the right hand. He noticed it when he tried to write, penmanship was smaller and shaky. Staying about the same. Left arm is fine. Walking fine. Sense of smell ok.  Has constipation. He just moved here from [place]. Lives in a town house. Plays golf, lives independently. Can't read his writing anymore. No stiffness. Father had a tremor. Balance is getting poor, if he bends down he may fall over. Vision is changing, "hazy" vision when driving at night. Worked in trucking for 33 years. He can't pick up as much as he used to. He is very forgetful. He will forget recent conversations, his wife will tell him something and a few hours later he may forget. Dad with Parkinson's Disease. Tremor is mostly at rest or when writing. Was walking the dog and the dog went quickly and he couldn't stop himself from falling down, and broke his wrist. Sometimes when he speeds up he can't stop, has to "stutter step" to turn. No neck pain. He takes daily aspirin, no history of stroke or TIA. Denies DM or heart disease. He takes no medication. He does not drink alcohol and never smoked.    Reviewed notes, labs and imaging from outside physicians, which showed: Was sent from [facility] for evaluation of tremor. CMP wnl, LDL 98,    MRI of the  brain:  This is a borderline normal MRI of the brain without contrast showing a mild extent of white matter foci that are likely due to age-related microvascular change. There is associated minimal atrophy.  There are no acute findings.   Review of Systems: Patient complains of symptoms per HPI as well as the following symptoms: back pain, cyst . Pertinent negatives and positives per HPI. All others negative     Social History   Socioeconomic History   Marital status: Married    Spouse name: [name]    Number of children: 2   Years of education: 12   Highest education level: Not on file  Occupational History   Occupation: Retired   Tobacco Use   Smoking status: Never   Smokeless tobacco: Never  Vaping Use   Vaping Use: Never used  Substance and Sexual Activity   Alcohol use: No    Alcohol/week: 0.0 standard drinks of alcohol   Drug use: No   Sexual activity: Not on file  Other Topics Concern   Not on file  Social History Narrative   Patient lives at home with wife [name]   Patient has 2 children    Patient has a high school education    Patient is right handed    Patient is retired    Caffeine use: 1 cup coffee (not daily)   Social Determinants of Radio broadcast assistant Strain: Not on file  Food Insecurity: Not on file  Transportation Needs: Not on file  Physical Activity: Not on file  Stress: Not on file  Social Connections: Not on file  Intimate Partner Violence: Not on file    Family History  Problem Relation Age of Onset   Parkinsonism Father     Past Medical History:  Diagnosis Date   Dementia ([place])    Falls    Hearing loss    Parkinson's disease [facility])     Past Surgical History:  Procedure Laterality Date   no surgical history per patient     None      Current Outpatient Medications  Medication Sig Dispense Refill   alum & mag hydroxide-simeth (MAALOX/MYLANTA) 200-200-20 MG/5ML suspension Take 30 mLs by mouth every 6 (six) hours as  needed for indigestion or heartburn.     aspirin 81 MG chewable tablet Chew 81 mg by mouth  daily.     benzonatate (TESSALON) 100 MG capsule Take 1 capsule (100 mg total) by mouth 3 (three) times daily as needed for cough. 20 capsule 0   carbidopa-levodopa (SINEMET IR) 25-100 MG tablet Take 1 tablet by mouth 4 (four) times daily. 0800, 1200, 1400, 2000 360 tablet 1   Cyanocobalamin 1000 MCG/ML KIT Inject 1,000 mcg into the muscle every 30 (thirty) days.     guaiFENesin (ROBITUSSIN) 100 MG/5ML liquid Take 200 mg by mouth every 6 (six) hours as needed for cough.     loperamide (IMODIUM) 2 MG capsule Take 2 mg by mouth as needed for diarrhea or loose stools.     magnesium hydroxide (MILK OF MAGNESIA) 400 MG/5ML suspension Take 30 mLs by mouth daily as needed for heartburn or indigestion.     Menthol-Zinc Oxide (CALMOSEPTINE) 0.44-20.6 % OINT Apply 1 application. topically in the morning, at noon, and at bedtime. Apply to buttocks     neomycin-bacitracin-polymyxin (NEOSPORIN) 5-260 005 8125 ointment Apply 1 application. topically as needed (minor skin tears or abrasions).     polycarbophil (FIBERCON) 625 MG tablet Take 625 mg by mouth daily.     polyethylene glycol powder (MIRALAX) 17 GM/SCOOP powder Take 17 g by mouth 2 (two) times daily as needed for moderate constipation. 255 g 0   senna-docusate (SENOKOT-S) 8.6-50 MG tablet Take 1 tablet by mouth 2 (two) times daily between meals as needed for mild constipation. 180 tablet 0   Zinc Oxide (DESITIN EX) Apply 1 application. topically See admin instructions. Apply to scrotum once daily scheduled and as needed after each incontinence episode     No current facility-administered medications for this visit.    Allergies as of 11/27/2022 - Review Complete 03/19/2022  Allergen Reaction Noted   Cyclobenzaprine Other (See Comments) 07/17/2021    Vitals: There were no vitals taken for this visit. Last Weight:  Wt Readings from Last 1 Encounters:  12/12/21  190 lb 12.8 oz (86.5 kg)   Last Height:   Ht Readings from Last 1 Encounters:  12/12/21 _0  (1.651 m)      04/11/2021    4:16 PM 09/13/2018   11:11 AM 08/25/2017    4:08 PM  MMSE - Mini Mental State Exam  Orientation to time _1 Orientation to Place _2 Registration _3 Attention/ Calculation _4 Recall 0 1 1  Language- name 2 objects _5 Language- repeat 0 0 0  Language- follow 3 step command _6 Language- read & follow direction _7 Write a sentence 1 0 1  Copy design 0 0 0  Total score _8 Prior exam today OTP was pleasant  Patient is here today, hypomimia, pupils equally round reactive to light, blinks to threat, face is symmetric, reduced hearing bilaterally, sitting in wheelchair, increased tone right greater than left, bradykinesia, mild resting tremor with intention and postural components, cogwheeling in the uppers, upright while sitting in wheelchair, could not ambulate independently, cognition impaired.      04/11/2021    4:16 PM 09/13/2018   11:11 AM 08/25/2017    4:08 PM  MMSE - Mini Mental State Exam  Orientation to time _9 Orientation to Place _10 Registration _11 Attention/ Calculation _12 Recall 0 1 1  Language- name 2 objects _13 Language- repeat 0  0 0  Language- follow 3 step command _0 Language- read & follow direction _1 Write a sentence 1 0 1  Copy design 0 0 0  Total score _2 Assessment/Plan:  81 year old wonderful male telephone follow up  of tremor, worsening penmanship, balance problems, falls, memory changes. Exam shows parkinsonian symptoms, shuffling and reduced arm swing, cogwheeling with facilitation, resting tremor(improved with sinemet). MRI of the brain unremarkable. Patient has been orthostatic in the office, Advised to drink more water. We discontinued Florinef because his blood pressure became too elevated.   In a new facility [facility]. Very happy there.  He drinks the ice tea not water, would non caffeinated and if he drinks ice tea that's fine just as long we get fluids in him. He keeps a fridge stocked with gatorade. No dizziness. Balance is off, fallen quite a bit but not in the last few months, advised to use a walking aid. He gets up on his own when he isn't supposed and she has talked to the staff about it. They visit him every day. He had speech therapy and gets therapy in the facility. They decline PT. In the process of getting hospice on board. No pain. He is doing surprisingly well. He is happy and content.   Progression of PD: He is being moved to a 24x7 facility. Dementia and PD progressed. Son here to day. He is going to [place] for long term care, [place] and being discharged to [facility] for long term. He can use a walker. He has not been getting dizzy. He goes to PT, mostly bound to wheelchair. The aricept was taken off. No problems sleeping. He has hallucintaions and delusions that bother him.  Spoke to son about PD, UTIs, environmental factors, hospice, delusions, hallucinations, and answered all questions.   Parkinson's disease: Continue Sinemet. 1 pills four time a day (8am, 11am, 2pm and 5pm for example). Space 3-4 hours apart. Try to separate Sinemet from food (especially protein-rich foods like meat, dairy, eggs) by about 30-60 mins - this will help the absorption of the medication. If you have some nausea with the medication, you can take it with some light food like crackers or ginger ale. Power over PD group, information given. Document when he noticed the medication wearing off.    Dizziness: Orthostatic hypotension. IMPROVED WITH MORE WATER INTAKE and B12 injections. Denies dizziness today. Started drinking more fluids sparkling water and less pepsi. Improved.       Snoring, daytime somnolence, ESS 12: Sleep eval showed no clinically significant sleep apnea. 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 stable  dementia: Worsening slowly. 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 Aricept with diarrhea, stopped, they no longer wish him to be on it  Lightweight wheelchair: Patient was evaluated for a condition that supports the need for a wheelchair, patient has a mobility limitation that impairs her ability to do 1 or more MR ADLs in a customary location in the home due to his Parkinson's disease and dementia, patient cannot use a cane crutches or walker to resolve the issue sufficiently due to gait and balance, patient can safely self propel the wheelchair in the home and also has a caregiver that can provide assistance, patient cannot push themselves in a heavier, standard  weight wheelchair in the home.  Patient has shown that they can self propel in a lightweight wheelchair.Patient cannot ambulate unassisted without risk of fall.   Sarina Ill, MD  [facility] [address] [place], [place] [address]  Phone [phone] Fax [phone]

## 2022-12-02 ENCOUNTER — Telehealth: Payer: Self-pay | Admitting: Neurology

## 2022-12-02 NOTE — Telephone Encounter (Signed)
Percival Spanish from [facility] called stating that she is needing a Hospice Evaluation Order for pt. She states that a V.O. is fine.  She also wanted to inform the office that the pt's PCP has changed. Dr. Jacelyn Grip has just retired.

## 2022-12-02 NOTE — Telephone Encounter (Signed)
I called Percival Spanish, with [facility].  She is asking for a order for hospice evaluation. I relayed that Dr. Lurline Del DO is pts new pcp, or if he is in facility in memory care that they have a medical director / NP that also give order. She is welcome to call back if needed.

## 2022-12-02 NOTE — Telephone Encounter (Signed)
Pt new PCP, Dr. Lurline Del, DO.

## 2022-12-02 NOTE — Telephone Encounter (Signed)
Please call Kyle Dy E. back when available.

## 2022-12-11 ENCOUNTER — Telehealth: Payer: Self-pay | Admitting: Neurology

## 2022-12-11 DIAGNOSIS — R531 Weakness: Secondary | ICD-10-CM

## 2022-12-11 DIAGNOSIS — G20A2 Parkinson's disease without dyskinesia, with fluctuations: Secondary | ICD-10-CM

## 2022-12-11 DIAGNOSIS — R2689 Other abnormalities of gait and mobility: Secondary | ICD-10-CM

## 2022-12-11 DIAGNOSIS — F0280 Dementia in other diseases classified elsewhere without behavioral disturbance: Secondary | ICD-10-CM

## 2022-12-11 NOTE — Telephone Encounter (Signed)
Pt's wife, Aniket Paye (on [place]) asking Dr. Jaynee Eagles for an order for a wheelchair. Would like a call from the nurse to discuss what kind of wheelchair and to give her list of companies in network.

## 2022-12-11 NOTE — Telephone Encounter (Signed)
Pt wife return, states pt is in facility but he is unable to walk or be as mobile as needed. The facility he is at does have wheelchairs but at times they are not available due to other pts sharing them. Wife would like pt to have his own chair to be able to go out with family and be mobile when need. Pt was informed by phone staff Dr Jaynee Eagles was not in office at the moment. Pt is okay with waiting for Dr Ferdinand Lango return to see if she is willing to order wheelchair.  If so, wife stated [employer] Is in network.

## 2022-12-11 NOTE — Telephone Encounter (Signed)
Contacted pt spouse back, LMV rq CB

## 2022-12-22 NOTE — Telephone Encounter (Signed)
What is the easiest way to get a wheelchair, can we order one or send him to PT for an evaluation and have them order it?

## 2022-12-23 NOTE — Addendum Note (Signed)
Addended by: Gildardo Griffes on: 12/23/2022 10:26 AM   Modules accepted: Orders

## 2022-12-23 NOTE — Addendum Note (Signed)
Addended by: Gildardo Griffes on: 12/23/2022 04:37 PM   Modules accepted: Orders

## 2022-12-23 NOTE — Telephone Encounter (Addendum)
Spoke with [employer]. Order would need to be re-written specifying lightweight manual wheelchair. Order placed and is pending Dr Cathren Laine signature. I called pt's wife. She wants to make sure Dr Jaynee Eagles is ok with this. I told her Dr Jaynee Eagles would review and sign off tomorrow AM and then we will fax to [employer]. She verbalized appreciation.

## 2022-12-23 NOTE — Telephone Encounter (Signed)
Spoke with pt's wife [name]. She is asking for a standard manual wheelchair. Looks like the fastest way to get that would be to send an order directly over to [employer]. I called [employer]. The fax number we are to send order to is [phone]. The phone number for pt's wife to call if she doesn't hear from them within the next few days is [phone] (I gave this to her).   Order for manual wheelchair written and is pending Dr Cathren Laine signature.

## 2022-12-23 NOTE — Telephone Encounter (Signed)
Pt's wife is asking for a call to discuss in greater detail they type wheelchair that was ordered for pt.  She is aware that there is a basic and a Immunologist, wife states the light weight is what she hopes Dr Jaynee Eagles ordered for, please call wife.

## 2022-12-23 NOTE — Telephone Encounter (Signed)
Order for manual wheelchair signed by Dr Jaynee Eagles, then faxed to [employer]. Received a receipt of confirmation.

## 2022-12-25 NOTE — Telephone Encounter (Signed)
Updated Rx signed by Dr Jaynee Eagles then faxed to [employer]. Received a receipt of confirmation.

## 2023-01-06 NOTE — Telephone Encounter (Signed)
Received fax from [facility] requesting specific documentation that would be needed for light weight wheelchair. Dr Jaynee Eagles made addendum to office note. Updated note faxed back to [employer]. Received a receipt of confirmation. [phone]

## 2023-01-13 ENCOUNTER — Telehealth: Payer: Self-pay | Admitting: Neurology

## 2023-01-13 NOTE — Telephone Encounter (Signed)
Wife of pt called back.  She said has appt with pcp Dr. Vanessa Gould next week.  She will ask about therapies and B12.  I relayed that we did receive call from hospice, but deferred to pcp to address.  She verbalized understanding.  Appreciated our help.

## 2023-01-13 NOTE — Telephone Encounter (Signed)
Pt wife reports pt is in memory care.  She is asking if Dr Jaynee Eagles can put an order in for PT, OT & ST.  Wife is also asking for B12 shots to be ordered by Dr Jaynee Eagles as well, please call wife.

## 2023-01-13 NOTE — Telephone Encounter (Signed)
I called and spoke to wife of pt.  She said they did not have a pcp.  I relayed a previous message that Lurline Del DO is who I have in telephone note who is his pcp.  Wife was not aware and will call [facility] and inquire.  She will ask about PT/OT at the [place] facility and also about B12 inj.  She said that pt is no longer seeing the PA at facility (she did not know why) and there is no other medical person to see him.  She will let us know if needs [place] after speaking to [facility].  She was appreciative of the information.

## 2023-01-13 NOTE — Telephone Encounter (Signed)
We need more information. Is there PT/OT/SP at his memory unit? If so we have to order it there. Also I hve not been managing his B12, would call pcp for that to be ordered to the facility (can they even inject him with it?)

## 2023-01-21 DIAGNOSIS — F02B0 Dementia in other diseases classified elsewhere, moderate, without behavioral disturbance, psychotic disturbance, mood disturbance, and anxiety: Secondary | ICD-10-CM | POA: Diagnosis not present

## 2023-01-21 DIAGNOSIS — Z5181 Encounter for therapeutic drug level monitoring: Secondary | ICD-10-CM | POA: Diagnosis not present

## 2023-01-21 DIAGNOSIS — E538 Deficiency of other specified B group vitamins: Secondary | ICD-10-CM | POA: Diagnosis not present

## 2023-01-21 DIAGNOSIS — Z0000 Encounter for general adult medical examination without abnormal findings: Secondary | ICD-10-CM | POA: Diagnosis not present

## 2023-01-21 DIAGNOSIS — G20A1 Parkinson's disease without dyskinesia, without mention of fluctuations: Secondary | ICD-10-CM | POA: Diagnosis not present

## 2023-01-21 DIAGNOSIS — Z23 Encounter for immunization: Secondary | ICD-10-CM | POA: Diagnosis not present

## 2023-01-21 DIAGNOSIS — Z6833 Body mass index (BMI) 33.0-33.9, adult: Secondary | ICD-10-CM | POA: Diagnosis not present

## 2023-02-03 DIAGNOSIS — E538 Deficiency of other specified B group vitamins: Secondary | ICD-10-CM | POA: Diagnosis not present

## 2023-02-03 DIAGNOSIS — M545 Low back pain, unspecified: Secondary | ICD-10-CM | POA: Diagnosis not present

## 2023-02-03 DIAGNOSIS — F0280 Dementia in other diseases classified elsewhere without behavioral disturbance: Secondary | ICD-10-CM | POA: Diagnosis not present

## 2023-02-03 DIAGNOSIS — H9190 Unspecified hearing loss, unspecified ear: Secondary | ICD-10-CM | POA: Diagnosis not present

## 2023-02-03 DIAGNOSIS — F4321 Adjustment disorder with depressed mood: Secondary | ICD-10-CM | POA: Diagnosis not present

## 2023-02-03 DIAGNOSIS — G20A1 Parkinson's disease without dyskinesia, without mention of fluctuations: Secondary | ICD-10-CM | POA: Diagnosis not present

## 2023-02-03 DIAGNOSIS — R251 Tremor, unspecified: Secondary | ICD-10-CM | POA: Diagnosis not present

## 2023-02-03 DIAGNOSIS — H9193 Unspecified hearing loss, bilateral: Secondary | ICD-10-CM | POA: Diagnosis not present

## 2023-02-03 DIAGNOSIS — M5136 Other intervertebral disc degeneration, lumbar region: Secondary | ICD-10-CM | POA: Diagnosis not present

## 2023-02-03 DIAGNOSIS — R1311 Dysphagia, oral phase: Secondary | ICD-10-CM | POA: Diagnosis not present

## 2023-02-17 DIAGNOSIS — Z7982 Long term (current) use of aspirin: Secondary | ICD-10-CM | POA: Diagnosis not present

## 2023-02-17 DIAGNOSIS — H9193 Unspecified hearing loss, bilateral: Secondary | ICD-10-CM | POA: Diagnosis not present

## 2023-02-17 DIAGNOSIS — E538 Deficiency of other specified B group vitamins: Secondary | ICD-10-CM | POA: Diagnosis not present

## 2023-02-17 DIAGNOSIS — L570 Actinic keratosis: Secondary | ICD-10-CM | POA: Diagnosis not present

## 2023-02-17 DIAGNOSIS — M545 Low back pain, unspecified: Secondary | ICD-10-CM | POA: Diagnosis not present

## 2023-02-17 DIAGNOSIS — F02C0 Dementia in other diseases classified elsewhere, severe, without behavioral disturbance, psychotic disturbance, mood disturbance, and anxiety: Secondary | ICD-10-CM | POA: Diagnosis not present

## 2023-02-17 DIAGNOSIS — Z9181 History of falling: Secondary | ICD-10-CM | POA: Diagnosis not present

## 2023-02-17 DIAGNOSIS — Z6833 Body mass index (BMI) 33.0-33.9, adult: Secondary | ICD-10-CM | POA: Diagnosis not present

## 2023-02-17 DIAGNOSIS — E669 Obesity, unspecified: Secondary | ICD-10-CM | POA: Diagnosis not present

## 2023-02-17 DIAGNOSIS — G20A1 Parkinson's disease without dyskinesia, without mention of fluctuations: Secondary | ICD-10-CM | POA: Diagnosis not present

## 2023-02-25 ENCOUNTER — Emergency Department (HOSPITAL_BASED_OUTPATIENT_CLINIC_OR_DEPARTMENT_OTHER): Payer: PPO

## 2023-02-25 ENCOUNTER — Emergency Department (HOSPITAL_BASED_OUTPATIENT_CLINIC_OR_DEPARTMENT_OTHER)
Admission: EM | Admit: 2023-02-25 | Discharge: 2023-02-25 | Disposition: A | Discharge status: Home / Self Care | Payer: PPO | Attending: Emergency Medicine | Admitting: Emergency Medicine

## 2023-02-25 ENCOUNTER — Other Ambulatory Visit: Payer: Self-pay

## 2023-02-25 DIAGNOSIS — H9190 Unspecified hearing loss, unspecified ear: Secondary | ICD-10-CM | POA: Diagnosis not present

## 2023-02-25 DIAGNOSIS — R1013 Epigastric pain: Secondary | ICD-10-CM | POA: Diagnosis not present

## 2023-02-25 DIAGNOSIS — Z7982 Long term (current) use of aspirin: Secondary | ICD-10-CM | POA: Diagnosis not present

## 2023-02-25 DIAGNOSIS — K429 Umbilical hernia without obstruction or gangrene: Secondary | ICD-10-CM | POA: Insufficient documentation

## 2023-02-25 DIAGNOSIS — R112 Nausea with vomiting, unspecified: Secondary | ICD-10-CM | POA: Insufficient documentation

## 2023-02-25 DIAGNOSIS — R1111 Vomiting without nausea: Secondary | ICD-10-CM | POA: Diagnosis not present

## 2023-02-25 DIAGNOSIS — G20C Parkinsonism, unspecified: Secondary | ICD-10-CM | POA: Diagnosis not present

## 2023-02-25 DIAGNOSIS — F0390 Unspecified dementia without behavioral disturbance: Secondary | ICD-10-CM | POA: Diagnosis not present

## 2023-02-25 DIAGNOSIS — Z743 Need for continuous supervision: Secondary | ICD-10-CM | POA: Diagnosis not present

## 2023-02-25 DIAGNOSIS — R109 Unspecified abdominal pain: Secondary | ICD-10-CM | POA: Diagnosis present

## 2023-02-25 DIAGNOSIS — Z1152 Encounter for screening for COVID-19: Secondary | ICD-10-CM | POA: Insufficient documentation

## 2023-02-25 DIAGNOSIS — I10 Essential (primary) hypertension: Secondary | ICD-10-CM | POA: Diagnosis not present

## 2023-02-25 DIAGNOSIS — R110 Nausea: Secondary | ICD-10-CM | POA: Diagnosis not present

## 2023-02-25 LAB — URINALYSIS, ROUTINE W REFLEX MICROSCOPIC
Bacteria, UA: NONE SEEN
Bilirubin Urine: NEGATIVE
Glucose, UA: NEGATIVE mg/dL
Ketones, ur: NEGATIVE mg/dL
Leukocytes,Ua: NEGATIVE
Nitrite: NEGATIVE
Specific Gravity, Urine: 1.04 — ABNORMAL HIGH (ref 1.005–1.030)
pH: 6.5 (ref 5.0–8.0)

## 2023-02-25 LAB — COMPREHENSIVE METABOLIC PANEL
ALT: 8 U/L (ref 0–44)
AST: 14 U/L — ABNORMAL LOW (ref 15–41)
Albumin: 4.5 g/dL (ref 3.5–5.0)
Alkaline Phosphatase: 69 U/L (ref 38–126)
Anion gap: 7 (ref 5–15)
BUN: 23 mg/dL (ref 8–23)
CO2: 30 mmol/L (ref 22–32)
Calcium: 9.7 mg/dL (ref 8.9–10.3)
Chloride: 104 mmol/L (ref 98–111)
Creatinine, Ser: 0.98 mg/dL (ref 0.61–1.24)
GFR, Estimated: >60 mL/min (ref >60)
Glucose, Bld: 126 mg/dL — ABNORMAL HIGH (ref 70–99)
Potassium: 4.5 mmol/L (ref 3.5–5.1)
Sodium: 141 mmol/L (ref 135–145)
Total Bilirubin: 0.7 mg/dL (ref 0.3–1.2)
Total Protein: 7.2 g/dL (ref 6.5–8.1)

## 2023-02-25 LAB — CBC WITH DIFFERENTIAL/PLATELET
Abs Immature Granulocytes: 0.12 10*3/uL — ABNORMAL HIGH (ref 0.00–0.07)
Basophils Absolute: 0.1 10*3/uL (ref 0.0–0.1)
Basophils Relative: 0 %
Eosinophils Absolute: 0.1 10*3/uL (ref 0.0–0.5)
Eosinophils Relative: 0 %
HCT: 45.7 % (ref 39.0–52.0)
Hemoglobin: 15.6 g/dL (ref 13.0–17.0)
Immature Granulocytes: 1 %
Lymphocytes Relative: 7 %
Lymphs Abs: 1 10*3/uL (ref 0.7–4.0)
MCH: 31.5 pg (ref 26.0–34.0)
MCHC: 34.1 g/dL (ref 30.0–36.0)
MCV: 92.3 fL (ref 80.0–100.0)
Monocytes Absolute: 0.8 10*3/uL (ref 0.1–1.0)
Monocytes Relative: 5 %
Neutro Abs: 12.7 10*3/uL — ABNORMAL HIGH (ref 1.7–7.7)
Neutrophils Relative %: 87 %
Platelets: 227 10*3/uL (ref 150–400)
RBC: 4.95 MIL/uL (ref 4.22–5.81)
RDW: 13.4 % (ref 11.5–15.5)
WBC: 14.6 10*3/uL — ABNORMAL HIGH (ref 4.0–10.5)
nRBC: 0 % (ref 0.0–0.2)

## 2023-02-25 LAB — RESP PANEL BY RT-PCR (RSV, FLU A&B, COVID)  RVPGX2
Influenza A by PCR: NEGATIVE
Influenza B by PCR: NEGATIVE
Resp Syncytial Virus by PCR: NEGATIVE
SARS Coronavirus 2 by RT PCR: NEGATIVE

## 2023-02-25 LAB — LIPASE, BLOOD: Lipase: 13 U/L (ref 11–51)

## 2023-02-25 MED ORDER — IOHEXOL 300 MG/ML  SOLN
100.0000 mL | Freq: Once | INTRAMUSCULAR | Status: AC | PRN
  Administered 2023-02-25: 85 mL via INTRAVENOUS

## 2023-02-25 MED ORDER — PANTOPRAZOLE SODIUM 20 MG PO TBEC: 20.0000 mg | DELAYED_RELEASE_TABLET | Freq: Every day | ORAL | 0 refills | Status: AC

## 2023-02-25 MED ORDER — ONDANSETRON HCL 4 MG PO TABS: 4.0000 mg | ORAL_TABLET | Freq: Four times a day (QID) | ORAL | 0 refills | Status: AC

## 2023-02-25 MED ORDER — SODIUM CHLORIDE 0.9 % IV BOLUS
1000.0000 mL | Freq: Once | INTRAVENOUS | Status: AC
  Administered 2023-02-25: 1000 mL via INTRAVENOUS

## 2023-02-25 NOTE — ED Provider Notes (Signed)
[facility] Provider Note  CSN: [id number] Arrival date & time: 02/25/23 1557  Chief Complaint(s) Emesis  HPI Kyle Mcguire is a 82 y.o. male with past medical history as below, significant for consents disease, dementia, resides at nursing/memory care facility, hearing loss who presents to the ED with complaint of abd pain, nausea.  Patient is a poor historian given his history of Parkinson's, dementia.  History provided by wife at bedside reports that after eating lunch today facility called wife and told her that he was spitting up, complaining abdominal pain, moaning in discomfort.  Patient's symptoms resolved at this time, no longer spitting up or having any nausea.  Patient did not have emesis per se but is more so spitting up phlegm, saliva.  No change in bowel or bladder function per patient and her spouse at bedside.  No recent medication or diet changes.  Denies history of prior abdominal surgery.  Feels roughly back to baseline this time.  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 Authorizing Provider  ondansetron (ZOFRAN) 4 MG tablet Take 1 tablet (4 mg total) by mouth every 6 (six) hours. 02/25/23  Yes Wynona Dove A, DO  ondansetron (ZOFRAN) 4 MG tablet Take 1 tablet (4 mg total) by mouth every 6 (six) hours. 02/25/23   Yes Wynona Dove A, DO  pantoprazole (PROTONIX) 20 MG tablet Take 1 tablet (20 mg total) by mouth daily for 14 days. 02/25/23 03/11/23 Yes Wynona Dove A, DO  pantoprazole (PROTONIX) 20 MG tablet Take 1 tablet (20 mg total) by mouth daily for 14 days. 02/25/23 03/11/23 Yes Jeanell Sparrow, DO  alum & mag hydroxide-simeth (MAALOX/MYLANTA) 200-200-20 MG/5ML suspension Take 30 mLs by mouth every 6 (six) hours as needed for indigestion or heartburn.    [provider]  aspirin 81 MG chewable tablet Chew 81 mg by mouth daily.    [provider]  benzonatate (TESSALON) 100 MG capsule Take 1 capsule (100 mg total) by mouth 3 (three) times daily as needed for cough. 03/20/22   Barb Merino, MD  carbidopa-levodopa (SINEMET IR) 25-100 MG tablet Take 1 tablet by mouth 4 (four) times daily. 0800, 1200, 1400, 2000 11/27/22   [name], MD  Cyanocobalamin 1000 MCG/ML KIT Inject 1,000 mcg into the muscle every 30 (thirty) days.    [provider]  guaiFENesin (ROBITUSSIN) 100 MG/5ML liquid Take 200 mg by mouth every 6 (six) hours as needed for cough.    [provider]  loperamide (IMODIUM) 2 MG capsule Take 2 mg by mouth as needed for diarrhea or loose stools.    [provider]  magnesium hydroxide (MILK OF MAGNESIA) 400 MG/5ML suspension Take 30 mLs by mouth daily as needed for heartburn or indigestion.    [provider]  Menthol-Zinc Oxide (CALMOSEPTINE) 0.44-20.6 % OINT Apply 1  application. topically in the morning, at noon, and at bedtime. Apply to buttocks    [provider]  neomycin-bacitracin-polymyxin (NEOSPORIN) [phone] ointment Apply 1 application. topically as needed (minor skin tears or abrasions).    [provider]  polycarbophil (FIBERCON) 625 MG tablet Take 625 mg by mouth daily.    [provider]  polyethylene glycol powder (MIRALAX) 17 GM/SCOOP powder Take 17 g by mouth 2 (two) times daily as needed for  moderate constipation. 03/19/22   Mercy Riding, MD  senna-docusate (SENOKOT-S) 8.6-50 MG tablet Take 1 tablet by mouth 2 (two) times daily between meals as needed for mild constipation. 03/19/22   Mercy Riding, MD  Zinc Oxide (DESITIN EX) Apply 1 application. topically See admin instructions. Apply to scrotum once daily scheduled and as needed after each incontinence episode    [provider]                                                                                                                                    Past Surgical History Past Surgical History:  Procedure Laterality Date   no surgical history per patient     None     Family History Family History  Problem Relation Age of Onset   Parkinsonism Father     Social History Social History   Tobacco Use   Smoking status: Never   Smokeless tobacco: Never  Vaping Use   Vaping Use: Never used  Substance Use Topics   Alcohol use: No    Alcohol/week: 0.0 standard drinks of alcohol   Drug use: No   Allergies Cyclobenzaprine  Review of Systems Review of Systems  Constitutional:  Negative for chills and fever.  HENT:  Negative for facial swelling and trouble swallowing.   Eyes:  Negative for photophobia and visual disturbance.  Respiratory:  Negative for cough and shortness of breath.   Cardiovascular:  Negative for chest pain and palpitations.  Gastrointestinal:  Positive for abdominal pain and nausea. Negative for vomiting.  Endocrine: Negative for polydipsia and polyuria.  Genitourinary:  Negative for difficulty urinating and hematuria.  Musculoskeletal:  Negative for gait problem and joint swelling.  Skin:  Negative for pallor and rash.  Neurological:  Negative for syncope and headaches.  Psychiatric/Behavioral:  Negative for agitation and confusion.     Physical Exam Vital Signs  I have reviewed the triage vital signs BP (!) 145/78   Pulse 74   Temp (!) 97 F (36.1 C) (Oral)   Resp 16   SpO2  95%  Physical Exam Vitals and nursing note reviewed.  Constitutional:      General: He is not in acute distress.    Appearance: Normal appearance. He is well-developed. He is not ill-appearing.  HENT:     Head: Normocephalic and atraumatic.     Right Ear: External ear normal.     Left Ear: External ear normal.  Mouth/Throat:     Mouth: Mucous membranes are moist.  Eyes:     General: No scleral icterus. Cardiovascular:     Rate and Rhythm: Normal rate and regular rhythm.     Pulses: Normal pulses.     Heart sounds: Normal heart sounds.  Pulmonary:     Effort: Pulmonary effort is normal. No respiratory distress.     Breath sounds: Normal breath sounds.  Abdominal:     General: Abdomen is flat.     Palpations: Abdomen is soft.     Tenderness: There is no abdominal tenderness. There is no guarding or rebound.     Comments: Reducible periumbilical hernia  Musculoskeletal:     Cervical back: No rigidity.     Right lower leg: No edema.     Left lower leg: No edema.  Skin:    General: Skin is warm and dry.     Capillary Refill: Capillary refill takes less than 2 seconds.  Neurological:     Mental Status: He is alert and oriented to person, place, and time. Mental status is at baseline.     GCS: GCS eye subscore is 4. GCS verbal subscore is 5. GCS motor subscore is 6.  Psychiatric:        Mood and Affect: Mood normal.        Behavior: Behavior normal.     ED Results and Treatments Labs (all labs ordered are listed, but only abnormal results are displayed) Labs Reviewed  CBC WITH DIFFERENTIAL/PLATELET - Abnormal; Notable for the following components:      Result Value   WBC 14.6 (*)    Neutro Abs 12.7 (*)    Abs Immature Granulocytes 0.12 (*)    All other components within normal limits  COMPREHENSIVE METABOLIC PANEL - Abnormal; Notable for the following components:   Glucose, Bld 126 (*)    AST 14 (*)    All other components within normal limits  URINALYSIS, ROUTINE  W REFLEX MICROSCOPIC - Abnormal; Notable for the following components:   Specific Gravity, Urine 1.040 (*)    Hgb urine dipstick SMALL (*)    Protein, ur TRACE (*)    All other components within normal limits  RESP PANEL BY RT-PCR (RSV, FLU A&B, COVID)  RVPGX2  LIPASE, BLOOD                                                                                                                          Radiology CT ABDOMEN PELVIS W CONTRAST  Result Date: 02/25/2023 CLINICAL DATA:  Nausea vomiting EXAM: CT ABDOMEN AND PELVIS WITH CONTRAST TECHNIQUE: Multidetector CT imaging of the abdomen and pelvis was performed using the standard protocol following bolus administration of intravenous contrast. RADIATION DOSE REDUCTION: This exam was performed according to the departmental dose-optimization program which includes automated exposure control, adjustment of the mA and/or kV according to patient size and/or use of iterative reconstruction technique. CONTRAST:  53m OMNIPAQUE IOHEXOL 300 MG/ML  SOLN  COMPARISON:  None Available. FINDINGS: Lower chest: Lung bases demonstrate no acute airspace disease. Cardiomegaly. Hepatobiliary: No focal liver abnormality is seen. No gallstones, gallbladder wall thickening, or biliary dilatation. Pancreas: Unremarkable. No pancreatic ductal dilatation or surrounding inflammatory changes. Spleen: Normal in size without focal abnormality. Adrenals/Urinary Tract: Adrenal glands are normal. Kidneys show no hydronephrosis. The bladder is unremarkable. Stomach/Bowel: Stomach is within normal limits. Appendix appears normal. No evidence of bowel wall thickening, distention, or inflammatory changes. Vascular/Lymphatic: Moderate aortic atherosclerosis. No aneurysm. No suspicious lymph nodes. 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 Fat containing inguinal hernias. Musculoskeletal: No acute or suspicious osseous abnormality.  IMPRESSION: 1. No CT evidence for acute intra-abdominal or pelvic abnormality. 2. 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 3. Aortic atherosclerosis. Aortic Atherosclerosis (ICD10-I70.0). Electronically Signed   By: Donavan Foil M.D.   On: 02/25/2023 18:19    Pertinent labs & imaging results that were available during my care of the patient were reviewed by me and considered in my medical decision making (see MDM for details).  Medications Ordered in ED Medications  sodium chloride 0.9 % bolus 1,000 mL (0 mLs Intravenous Stopped 02/25/23 2155)  iohexol (OMNIPAQUE) 300 MG/ML solution 100 mL (85 mLs Intravenous Contrast Given 02/25/23 1753)                                                                                                                                     Procedures Procedures  (including critical care time)  Medical Decision Making / ED Course    Medical Decision Making:    DARYN MCCAUGHEY is a 82 y.o. male with past medical history as below, significant for consents disease, dementia, resides at nursing/memory care facility, hearing loss who presents to the ED with complaint of abd pain, nausea. The complaint involves an extensive differential diagnosis and also carries with it a high risk of complications and morbidity.  Serious etiology was considered. Ddx includes but is not limited to: Differential diagnosis includes but is not exclusive to acute cholecystitis, intrathoracic causes for epigastric abdominal pain, gastritis, duodenitis, pancreatitis, small bowel or large bowel obstruction, abdominal aortic aneurysm, hernia, gastritis, etc.   Complete initial physical exam performed, notably the patient  was no acute distress, asymptomatic.    Reviewed and confirmed nursing documentation for past medical history, family history, social history.  Vital signs reviewed.    Clinical Course as of 02/26/23 0023  Wed Feb 25, 2023  2000 Back to baseline per pt/family;  tolerating PO w/o difficulty.  [SG]  2000 WBC(!): 14.6 Possibly 2/2 recent emesis, no fever, VSS. No clear source of infection at this time [SG]  2232 Symptoms resolved, he is back to baseline.  Abdomen is soft, nontender, nonperitoneal.  Imaging reviewed and stable. Tolerating PO w/o difficulty.  Stable discharge back to facility.  Give PPI and antiemetic for  home.  Follow-up PCP [SG]    Clinical Course User Index [SG] Wynona Dove A, DO  Ct reviewed, stable  He is asymptomatic  Unclear etiology, ?viral  The patient's overall condition has improved, the patient presents with abdominal pain without signs of peritonitis, or other life-threatening serious etiology. The patient understands that at this time there is no evidence for a more malignant underlying process, but the patient also understands that early in the process of an illness, an emergency department workup can be falsely reassuring. Detailed discussions were had with the patient regarding current findings, and need for close f/u with PCP or on call doctor. The patient appears stable for discharge and has been instructed to return immediately if the symptoms worsen in any way, or i if not improved for re-evaluation. Patient verbalized understanding and is in agreement with current care plan.  All questions answered prior to discharge.     Additional history obtained: -Additional history obtained from spouse -External records from outside source obtained and reviewed including: Chart review including previous notes, labs, imaging, consultation notes including prior ED visits, primary care documentation, prior labs and imaging, home medications   Lab Tests: -I ordered, reviewed, and interpreted labs.   The pertinent results include:   Labs Reviewed  CBC WITH DIFFERENTIAL/PLATELET - Abnormal; Notable for the following components:      Result Value   WBC 14.6 (*)    Neutro Abs 12.7 (*)    Abs Immature Granulocytes 0.12 (*)     All other components within normal limits  COMPREHENSIVE METABOLIC PANEL - Abnormal; Notable for the following components:   Glucose, Bld 126 (*)    AST 14 (*)    All other components within normal limits  URINALYSIS, ROUTINE W REFLEX MICROSCOPIC - Abnormal; Notable for the following components:   Specific Gravity, Urine 1.040 (*)    Hgb urine dipstick SMALL (*)    Protein, ur TRACE (*)    All other components within normal limits  RESP PANEL BY RT-PCR (RSV, FLU A&B, COVID)  RVPGX2  LIPASE, BLOOD    Notable for as above, +wbc  EKG   EKG Interpretation  Date/Time:    Ventricular Rate:    PR Interval:    QRS Duration:   QT Interval:    QTC Calculation:   R Axis:     Text Interpretation:           Imaging Studies ordered: I ordered imaging studies including CTAP I independently visualized the following imaging with scope of interpretation limited to determining acute life threatening conditions related to emergency care: stable I independently visualized and interpreted imaging. I agree with the radiologist interpretation   Medicines ordered and prescription drug management: Meds ordered this encounter  Medications   sodium chloride 0.9 % bolus 1,000 mL   iohexol (OMNIPAQUE) 300 MG/ML solution 100 mL   pantoprazole (PROTONIX) 20 MG tablet    Sig: Take 1 tablet (20 mg total) by mouth daily for 14 days.    Dispense:  14 tablet    Refill:  0   ondansetron (ZOFRAN) 4 MG tablet    Sig: Take 1 tablet (4 mg total) by mouth every 6 (six) hours.    Dispense:  12 tablet    Refill:  0   ondansetron (ZOFRAN) 4 MG tablet    Sig: Take 1 tablet (4 mg total) by mouth every 6 (six) hours.    Dispense:  12 tablet    Refill:  0  pantoprazole (PROTONIX) 20 MG tablet    Sig: Take 1 tablet (20 mg total) by mouth daily for 14 days.    Dispense:  14 tablet    Refill:  0    -I have reviewed the patients home medicines and have made adjustments as needed   Consultations  Obtained: na   Cardiac Monitoring: The patient was maintained on a cardiac monitor.  I personally viewed and interpreted the cardiac monitored which showed an underlying rhythm of: nsr  Social Determinants of Health:  Diagnosis or treatment significantly limited by social determinants of health: dementia, snf   Reevaluation: After the interventions noted above, I reevaluated the patient and found that they have resolved  Co morbidities that complicate the patient evaluation  Past Medical History:  Diagnosis Date   Dementia ([place])    Falls    Hearing loss    Parkinson's disease ([place])       Dispostion: Disposition decision including need for hospitalization was considered, and patient discharged from emergency department.    Final Clinical Impression(s) / ED Diagnoses Final diagnoses:  Epigastric pain  Nausea     This chart was dictated using voice recognition software.  Despite best efforts to proofread,  errors can occur which can change the documentation meaning.    Jeanell Sparrow, DO 02/26/23 0023

## 2023-02-25 NOTE — ED Notes (Signed)
Patient transported to CT 

## 2023-02-25 NOTE — ED Notes (Signed)
Tolerated PO challenge without difficulty.

## 2023-02-25 NOTE — ED Triage Notes (Addendum)
Pt from [facility]. 1 episode of vomiting clear mucous after lunch. When asked how he is, he says "terrible... been vomiting all day." Denies any pain and belly is nontender to palpation. 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 Family at bedside.

## 2023-02-25 NOTE — Discharge Instructions (Addendum)
It was a pleasure caring for you today in the emergency department. ° °Please return to the emergency department for any worsening or worrisome symptoms. ° ° °

## 2023-02-25 NOTE — ED Notes (Signed)
Family at bedside. Urinal placed and pt instructed to urinate. Pt having hard time following directions due to confusion. Rn and family continue to redirect.

## 2023-03-21 DIAGNOSIS — M25552 Pain in left hip: Secondary | ICD-10-CM | POA: Diagnosis not present

## 2023-03-26 DIAGNOSIS — E538 Deficiency of other specified B group vitamins: Secondary | ICD-10-CM | POA: Diagnosis not present

## 2023-03-26 DIAGNOSIS — G20A1 Parkinson's disease without dyskinesia, without mention of fluctuations: Secondary | ICD-10-CM | POA: Diagnosis not present

## 2023-03-26 DIAGNOSIS — R296 Repeated falls: Secondary | ICD-10-CM | POA: Diagnosis not present

## 2023-03-26 DIAGNOSIS — B351 Tinea unguium: Secondary | ICD-10-CM | POA: Diagnosis not present

## 2023-03-26 DIAGNOSIS — F03C0 Unspecified dementia, severe, without behavioral disturbance, psychotic disturbance, mood disturbance, and anxiety: Secondary | ICD-10-CM | POA: Diagnosis not present

## 2023-03-26 DIAGNOSIS — R2689 Other abnormalities of gait and mobility: Secondary | ICD-10-CM | POA: Diagnosis not present

## 2023-05-04 DIAGNOSIS — E538 Deficiency of other specified B group vitamins: Secondary | ICD-10-CM | POA: Diagnosis not present

## 2023-05-04 DIAGNOSIS — R296 Repeated falls: Secondary | ICD-10-CM | POA: Diagnosis not present

## 2023-05-04 DIAGNOSIS — F03C0 Unspecified dementia, severe, without behavioral disturbance, psychotic disturbance, mood disturbance, and anxiety: Secondary | ICD-10-CM | POA: Diagnosis not present

## 2023-05-04 DIAGNOSIS — G20A1 Parkinson's disease without dyskinesia, without mention of fluctuations: Secondary | ICD-10-CM | POA: Diagnosis not present

## 2023-06-28 DIAGNOSIS — R110 Nausea: Secondary | ICD-10-CM | POA: Diagnosis not present

## 2023-06-28 DIAGNOSIS — R1111 Vomiting without nausea: Secondary | ICD-10-CM | POA: Diagnosis not present

## 2023-06-28 DIAGNOSIS — R918 Other nonspecific abnormal finding of lung field: Secondary | ICD-10-CM | POA: Diagnosis not present

## 2023-06-28 DIAGNOSIS — J209 Acute bronchitis, unspecified: Secondary | ICD-10-CM | POA: Diagnosis not present

## 2023-06-28 DIAGNOSIS — J208 Acute bronchitis due to other specified organisms: Secondary | ICD-10-CM | POA: Diagnosis not present

## 2023-06-28 DIAGNOSIS — R0902 Hypoxemia: Secondary | ICD-10-CM | POA: Diagnosis not present

## 2023-06-28 DIAGNOSIS — R531 Weakness: Secondary | ICD-10-CM | POA: Diagnosis not present

## 2023-06-28 DIAGNOSIS — R1084 Generalized abdominal pain: Secondary | ICD-10-CM | POA: Diagnosis not present

## 2023-06-28 DIAGNOSIS — F0280 Dementia in other diseases classified elsewhere without behavioral disturbance: Secondary | ICD-10-CM | POA: Diagnosis not present

## 2023-06-28 DIAGNOSIS — G20A1 Parkinson's disease without dyskinesia, without mention of fluctuations: Secondary | ICD-10-CM | POA: Diagnosis not present

## 2023-06-28 DIAGNOSIS — B9689 Other specified bacterial agents as the cause of diseases classified elsewhere: Secondary | ICD-10-CM | POA: Diagnosis not present

## 2023-06-28 DIAGNOSIS — R9431 Abnormal electrocardiogram [ECG] [EKG]: Secondary | ICD-10-CM | POA: Diagnosis not present

## 2023-09-16 DIAGNOSIS — B351 Tinea unguium: Secondary | ICD-10-CM | POA: Diagnosis not present

## 2023-09-16 DIAGNOSIS — K5909 Other constipation: Secondary | ICD-10-CM | POA: Diagnosis not present

## 2023-09-16 DIAGNOSIS — F0390 Unspecified dementia without behavioral disturbance: Secondary | ICD-10-CM | POA: Diagnosis not present

## 2023-09-16 DIAGNOSIS — K219 Gastro-esophageal reflux disease without esophagitis: Secondary | ICD-10-CM | POA: Diagnosis not present

## 2023-09-16 DIAGNOSIS — E669 Obesity, unspecified: Secondary | ICD-10-CM | POA: Diagnosis not present

## 2023-09-16 DIAGNOSIS — G20A1 Parkinson's disease without dyskinesia, without mention of fluctuations: Secondary | ICD-10-CM | POA: Diagnosis not present

## 2023-09-16 DIAGNOSIS — F5101 Primary insomnia: Secondary | ICD-10-CM | POA: Diagnosis not present

## 2023-10-09 DIAGNOSIS — F5101 Primary insomnia: Secondary | ICD-10-CM | POA: Diagnosis not present

## 2023-10-09 DIAGNOSIS — G20A1 Parkinson's disease without dyskinesia, without mention of fluctuations: Secondary | ICD-10-CM | POA: Diagnosis not present

## 2023-10-09 DIAGNOSIS — K5909 Other constipation: Secondary | ICD-10-CM | POA: Diagnosis not present

## 2023-10-09 DIAGNOSIS — F0280 Dementia in other diseases classified elsewhere without behavioral disturbance: Secondary | ICD-10-CM | POA: Diagnosis not present

## 2023-10-21 DIAGNOSIS — F5101 Primary insomnia: Secondary | ICD-10-CM | POA: Diagnosis not present

## 2023-10-21 DIAGNOSIS — K5909 Other constipation: Secondary | ICD-10-CM | POA: Diagnosis not present

## 2023-10-21 DIAGNOSIS — F0390 Unspecified dementia without behavioral disturbance: Secondary | ICD-10-CM | POA: Diagnosis not present

## 2023-10-21 DIAGNOSIS — B351 Tinea unguium: Secondary | ICD-10-CM | POA: Diagnosis not present

## 2023-10-21 DIAGNOSIS — G20A1 Parkinson's disease without dyskinesia, without mention of fluctuations: Secondary | ICD-10-CM | POA: Diagnosis not present

## 2023-10-29 DIAGNOSIS — F0390 Unspecified dementia without behavioral disturbance: Secondary | ICD-10-CM | POA: Diagnosis not present

## 2023-10-29 DIAGNOSIS — K5909 Other constipation: Secondary | ICD-10-CM | POA: Diagnosis not present

## 2023-10-29 DIAGNOSIS — F5101 Primary insomnia: Secondary | ICD-10-CM | POA: Diagnosis not present

## 2023-11-16 DIAGNOSIS — R296 Repeated falls: Secondary | ICD-10-CM | POA: Diagnosis not present

## 2023-11-16 DIAGNOSIS — W19XXXA Unspecified fall, initial encounter: Secondary | ICD-10-CM | POA: Diagnosis not present

## 2023-11-16 DIAGNOSIS — G20A1 Parkinson's disease without dyskinesia, without mention of fluctuations: Secondary | ICD-10-CM | POA: Diagnosis not present

## 2023-11-16 DIAGNOSIS — G20A2 Parkinson's disease without dyskinesia, with fluctuations: Secondary | ICD-10-CM | POA: Diagnosis not present

## 2023-11-25 DIAGNOSIS — G20A1 Parkinson's disease without dyskinesia, without mention of fluctuations: Secondary | ICD-10-CM | POA: Diagnosis not present

## 2023-11-25 DIAGNOSIS — F5104 Psychophysiologic insomnia: Secondary | ICD-10-CM | POA: Diagnosis not present

## 2023-11-25 DIAGNOSIS — R600 Localized edema: Secondary | ICD-10-CM | POA: Diagnosis not present

## 2024-02-22 DIAGNOSIS — G20A1 Parkinson's disease without dyskinesia, without mention of fluctuations: Secondary | ICD-10-CM | POA: Diagnosis not present

## 2024-02-22 DIAGNOSIS — F5104 Psychophysiologic insomnia: Secondary | ICD-10-CM | POA: Diagnosis not present

## 2024-02-22 DIAGNOSIS — K5909 Other constipation: Secondary | ICD-10-CM | POA: Diagnosis not present

## 2024-03-09 DIAGNOSIS — K5909 Other constipation: Secondary | ICD-10-CM | POA: Diagnosis not present

## 2024-03-09 DIAGNOSIS — G20A1 Parkinson's disease without dyskinesia, without mention of fluctuations: Secondary | ICD-10-CM | POA: Diagnosis not present

## 2024-03-09 DIAGNOSIS — F5101 Primary insomnia: Secondary | ICD-10-CM | POA: Diagnosis not present

## 2024-03-09 DIAGNOSIS — B351 Tinea unguium: Secondary | ICD-10-CM | POA: Diagnosis not present

## 2024-03-09 DIAGNOSIS — I2510 Atherosclerotic heart disease of native coronary artery without angina pectoris: Secondary | ICD-10-CM | POA: Diagnosis not present

## 2024-03-30 DIAGNOSIS — K5909 Other constipation: Secondary | ICD-10-CM | POA: Diagnosis not present

## 2024-03-30 DIAGNOSIS — F5101 Primary insomnia: Secondary | ICD-10-CM | POA: Diagnosis not present

## 2024-03-30 DIAGNOSIS — I2510 Atherosclerotic heart disease of native coronary artery without angina pectoris: Secondary | ICD-10-CM | POA: Diagnosis not present

## 2024-03-30 DIAGNOSIS — G20A1 Parkinson's disease without dyskinesia, without mention of fluctuations: Secondary | ICD-10-CM | POA: Diagnosis not present

## 2024-03-30 DIAGNOSIS — R1310 Dysphagia, unspecified: Secondary | ICD-10-CM | POA: Diagnosis not present

## 2024-03-30 DIAGNOSIS — B351 Tinea unguium: Secondary | ICD-10-CM | POA: Diagnosis not present

## 2024-03-30 DIAGNOSIS — Z9181 History of falling: Secondary | ICD-10-CM | POA: Diagnosis not present

## 2024-03-30 DIAGNOSIS — F03C0 Unspecified dementia, severe, without behavioral disturbance, psychotic disturbance, mood disturbance, and anxiety: Secondary | ICD-10-CM | POA: Diagnosis not present

## 2024-03-31 DIAGNOSIS — G20A1 Parkinson's disease without dyskinesia, without mention of fluctuations: Secondary | ICD-10-CM | POA: Diagnosis not present

## 2024-03-31 DIAGNOSIS — F5104 Psychophysiologic insomnia: Secondary | ICD-10-CM | POA: Diagnosis not present

## 2024-03-31 DIAGNOSIS — R601 Generalized edema: Secondary | ICD-10-CM | POA: Diagnosis not present

## 2024-03-31 DIAGNOSIS — R739 Hyperglycemia, unspecified: Secondary | ICD-10-CM | POA: Diagnosis not present

## 2024-05-11 DIAGNOSIS — R1312 Dysphagia, oropharyngeal phase: Secondary | ICD-10-CM | POA: Diagnosis not present

## 2024-05-11 DIAGNOSIS — I2510 Atherosclerotic heart disease of native coronary artery without angina pectoris: Secondary | ICD-10-CM | POA: Diagnosis not present

## 2024-05-11 DIAGNOSIS — G20A1 Parkinson's disease without dyskinesia, without mention of fluctuations: Secondary | ICD-10-CM | POA: Diagnosis not present

## 2024-05-11 DIAGNOSIS — F03C0 Unspecified dementia, severe, without behavioral disturbance, psychotic disturbance, mood disturbance, and anxiety: Secondary | ICD-10-CM | POA: Diagnosis not present

## 2024-05-11 DIAGNOSIS — F5101 Primary insomnia: Secondary | ICD-10-CM | POA: Diagnosis not present

## 2024-05-11 DIAGNOSIS — K5909 Other constipation: Secondary | ICD-10-CM | POA: Diagnosis not present

## 2024-05-12 DIAGNOSIS — R109 Unspecified abdominal pain: Secondary | ICD-10-CM | POA: Diagnosis not present

## 2024-05-12 DIAGNOSIS — K5909 Other constipation: Secondary | ICD-10-CM | POA: Diagnosis not present

## 2024-05-23 DIAGNOSIS — K5909 Other constipation: Secondary | ICD-10-CM | POA: Diagnosis not present

## 2024-05-23 DIAGNOSIS — G20A1 Parkinson's disease without dyskinesia, without mention of fluctuations: Secondary | ICD-10-CM | POA: Diagnosis not present

## 2024-05-23 DIAGNOSIS — F5104 Psychophysiologic insomnia: Secondary | ICD-10-CM | POA: Diagnosis not present

## 2024-06-24 DIAGNOSIS — K5909 Other constipation: Secondary | ICD-10-CM | POA: Diagnosis not present

## 2024-06-24 DIAGNOSIS — G20A1 Parkinson's disease without dyskinesia, without mention of fluctuations: Secondary | ICD-10-CM | POA: Diagnosis not present

## 2024-06-24 DIAGNOSIS — F03C0 Unspecified dementia, severe, without behavioral disturbance, psychotic disturbance, mood disturbance, and anxiety: Secondary | ICD-10-CM | POA: Diagnosis not present

## 2024-06-24 DIAGNOSIS — R1310 Dysphagia, unspecified: Secondary | ICD-10-CM | POA: Diagnosis not present

## 2024-06-24 DIAGNOSIS — F5104 Psychophysiologic insomnia: Secondary | ICD-10-CM | POA: Diagnosis not present

## 2024-06-24 DIAGNOSIS — I2510 Atherosclerotic heart disease of native coronary artery without angina pectoris: Secondary | ICD-10-CM | POA: Diagnosis not present

## 2024-07-06 DIAGNOSIS — F5101 Primary insomnia: Secondary | ICD-10-CM | POA: Diagnosis not present

## 2024-07-06 DIAGNOSIS — K5909 Other constipation: Secondary | ICD-10-CM | POA: Diagnosis not present

## 2024-07-06 DIAGNOSIS — I2510 Atherosclerotic heart disease of native coronary artery without angina pectoris: Secondary | ICD-10-CM | POA: Diagnosis not present

## 2024-07-06 DIAGNOSIS — R1310 Dysphagia, unspecified: Secondary | ICD-10-CM | POA: Diagnosis not present

## 2024-07-06 DIAGNOSIS — G20A1 Parkinson's disease without dyskinesia, without mention of fluctuations: Secondary | ICD-10-CM | POA: Diagnosis not present

## 2024-07-18 DIAGNOSIS — F5101 Primary insomnia: Secondary | ICD-10-CM | POA: Diagnosis not present

## 2024-07-18 DIAGNOSIS — K5909 Other constipation: Secondary | ICD-10-CM | POA: Diagnosis not present

## 2024-07-18 DIAGNOSIS — I2510 Atherosclerotic heart disease of native coronary artery without angina pectoris: Secondary | ICD-10-CM | POA: Diagnosis not present

## 2024-07-18 DIAGNOSIS — R1310 Dysphagia, unspecified: Secondary | ICD-10-CM | POA: Diagnosis not present

## 2024-07-18 DIAGNOSIS — G20A1 Parkinson's disease without dyskinesia, without mention of fluctuations: Secondary | ICD-10-CM | POA: Diagnosis not present

## 2024-08-08 DIAGNOSIS — L988 Other specified disorders of the skin and subcutaneous tissue: Secondary | ICD-10-CM | POA: Diagnosis not present

## 2024-08-08 DIAGNOSIS — S41101A Unspecified open wound of right upper arm, initial encounter: Secondary | ICD-10-CM | POA: Diagnosis not present

## 2024-08-19 DIAGNOSIS — G20A1 Parkinson's disease without dyskinesia, without mention of fluctuations: Secondary | ICD-10-CM | POA: Diagnosis not present

## 2024-08-19 DIAGNOSIS — K5909 Other constipation: Secondary | ICD-10-CM | POA: Diagnosis not present

## 2024-08-19 DIAGNOSIS — I2510 Atherosclerotic heart disease of native coronary artery without angina pectoris: Secondary | ICD-10-CM | POA: Diagnosis not present

## 2024-08-23 DIAGNOSIS — R051 Acute cough: Secondary | ICD-10-CM | POA: Diagnosis not present

## 2024-08-23 DIAGNOSIS — R1310 Dysphagia, unspecified: Secondary | ICD-10-CM | POA: Diagnosis not present

## 2024-08-23 DIAGNOSIS — R0989 Other specified symptoms and signs involving the circulatory and respiratory systems: Secondary | ICD-10-CM | POA: Diagnosis not present

## 2024-08-23 DIAGNOSIS — G20A1 Parkinson's disease without dyskinesia, without mention of fluctuations: Secondary | ICD-10-CM | POA: Diagnosis not present

## 2024-08-23 DIAGNOSIS — Z1152 Encounter for screening for COVID-19: Secondary | ICD-10-CM | POA: Diagnosis not present

## 2024-08-24 DIAGNOSIS — J158 Pneumonia due to other specified bacteria: Secondary | ICD-10-CM | POA: Diagnosis not present

## 2024-08-24 DIAGNOSIS — G20A1 Parkinson's disease without dyskinesia, without mention of fluctuations: Secondary | ICD-10-CM | POA: Diagnosis not present

## 2024-08-26 DIAGNOSIS — G20A1 Parkinson's disease without dyskinesia, without mention of fluctuations: Secondary | ICD-10-CM | POA: Diagnosis not present

## 2024-08-26 DIAGNOSIS — J158 Pneumonia due to other specified bacteria: Secondary | ICD-10-CM | POA: Diagnosis not present

## 2024-09-06 DIAGNOSIS — I2510 Atherosclerotic heart disease of native coronary artery without angina pectoris: Secondary | ICD-10-CM | POA: Diagnosis not present

## 2024-09-06 DIAGNOSIS — R1310 Dysphagia, unspecified: Secondary | ICD-10-CM | POA: Diagnosis not present

## 2024-09-06 DIAGNOSIS — F5101 Primary insomnia: Secondary | ICD-10-CM | POA: Diagnosis not present

## 2024-09-06 DIAGNOSIS — J209 Acute bronchitis, unspecified: Secondary | ICD-10-CM | POA: Diagnosis not present

## 2024-09-06 DIAGNOSIS — G20A1 Parkinson's disease without dyskinesia, without mention of fluctuations: Secondary | ICD-10-CM | POA: Diagnosis not present

## 2024-09-13 DIAGNOSIS — R051 Acute cough: Secondary | ICD-10-CM | POA: Diagnosis not present

## 2024-09-13 DIAGNOSIS — R059 Cough, unspecified: Secondary | ICD-10-CM | POA: Diagnosis not present

## 2024-09-16 DIAGNOSIS — G20A1 Parkinson's disease without dyskinesia, without mention of fluctuations: Secondary | ICD-10-CM | POA: Diagnosis not present

## 2024-09-16 DIAGNOSIS — K5909 Other constipation: Secondary | ICD-10-CM | POA: Diagnosis not present

## 2024-09-16 DIAGNOSIS — R051 Acute cough: Secondary | ICD-10-CM | POA: Diagnosis not present

## 2024-09-17 DIAGNOSIS — R0989 Other specified symptoms and signs involving the circulatory and respiratory systems: Secondary | ICD-10-CM | POA: Diagnosis not present

## 2024-09-19 DIAGNOSIS — J158 Pneumonia due to other specified bacteria: Secondary | ICD-10-CM | POA: Diagnosis not present

## 2024-09-19 DIAGNOSIS — G20A1 Parkinson's disease without dyskinesia, without mention of fluctuations: Secondary | ICD-10-CM | POA: Diagnosis not present

## 2024-09-22 DIAGNOSIS — G20A1 Parkinson's disease without dyskinesia, without mention of fluctuations: Secondary | ICD-10-CM | POA: Diagnosis not present

## 2024-09-22 DIAGNOSIS — J158 Pneumonia due to other specified bacteria: Secondary | ICD-10-CM | POA: Diagnosis not present

## 2024-09-26 DIAGNOSIS — J158 Pneumonia due to other specified bacteria: Secondary | ICD-10-CM | POA: Diagnosis not present

## 2024-09-26 DIAGNOSIS — R0989 Other specified symptoms and signs involving the circulatory and respiratory systems: Secondary | ICD-10-CM | POA: Diagnosis not present

## 2024-09-26 DIAGNOSIS — L24A9 Irritant contact dermatitis due friction or contact with other specified body fluids: Secondary | ICD-10-CM | POA: Diagnosis not present

## 2024-10-03 DIAGNOSIS — L24A9 Irritant contact dermatitis due friction or contact with other specified body fluids: Secondary | ICD-10-CM | POA: Diagnosis not present

## 2024-10-05 DIAGNOSIS — R0989 Other specified symptoms and signs involving the circulatory and respiratory systems: Secondary | ICD-10-CM | POA: Diagnosis not present

## 2024-10-13 DIAGNOSIS — E669 Obesity, unspecified: Secondary | ICD-10-CM | POA: Diagnosis not present

## 2024-10-17 DIAGNOSIS — Z515 Encounter for palliative care: Secondary | ICD-10-CM | POA: Diagnosis not present

## 2024-10-17 DIAGNOSIS — R627 Adult failure to thrive: Secondary | ICD-10-CM | POA: Diagnosis not present
